# Patient Record
Sex: Female | Born: 1945 | Race: White | Hispanic: No | Marital: Married | Smoking: Former smoker
Health system: Southern US, Community
[De-identification: ages and names within clinical notes are randomized; demographics above are authoritative.]

## PROBLEM LIST (undated history)

## (undated) DIAGNOSIS — J45909 Unspecified asthma, uncomplicated: Secondary | ICD-10-CM

## (undated) DIAGNOSIS — I10 Essential (primary) hypertension: Secondary | ICD-10-CM

## (undated) DIAGNOSIS — E119 Type 2 diabetes mellitus without complications: Secondary | ICD-10-CM

## (undated) HISTORY — DX: Unspecified asthma, uncomplicated: J45.909

## (undated) HISTORY — PX: DE QUERVAIN'S RELEASE: SHX1439

## (undated) HISTORY — PX: THYROIDECTOMY, PARTIAL: SHX18

## (undated) HISTORY — PX: CARPAL TUNNEL RELEASE: SHX101

## (undated) HISTORY — PX: TRIGGER FINGER RELEASE: SHX641

## (undated) HISTORY — PX: VAGINAL HYSTERECTOMY: SHX2639

---

## 2019-10-29 ENCOUNTER — Other Ambulatory Visit: Payer: Self-pay

## 2019-10-29 ENCOUNTER — Encounter: Payer: Self-pay | Admitting: Pulmonary Disease

## 2019-10-29 ENCOUNTER — Ambulatory Visit (INDEPENDENT_AMBULATORY_CARE_PROVIDER_SITE_OTHER): Payer: Medicare Other | Admitting: Pulmonary Disease

## 2019-10-29 VITALS — BP 123/80 | HR 77 | Temp 97.5°F | Ht 65.0 in | Wt 156.8 lb

## 2019-10-29 DIAGNOSIS — J849 Interstitial pulmonary disease, unspecified: Secondary | ICD-10-CM | POA: Diagnosis not present

## 2019-10-29 DIAGNOSIS — G4733 Obstructive sleep apnea (adult) (pediatric): Secondary | ICD-10-CM | POA: Diagnosis not present

## 2019-10-29 NOTE — Progress Notes (Signed)
Kathryn Scott    [id number]    1946/06/30  Primary Care Physician:Burgess, Roxana Hires, MD  Referring Physician: No referring provider defined for this encounter.  Chief complaint: Consult for IPF  HPI: Past medical history of hypertension, asthma, hyperlipidemia, allergies, IPF  Diagnosed with IPF in 2020.  She is to follow with Dr. Leonor Liv at [facility] in [place] with CT scan showing UIP fibrosis.  She was started on Ofev 2020 however developed epistaxis requiring balloon.  Stop her Ofev and aspirin briefly, evaluated by ENT and initiated again with lower dose of Ofev at 100 twice daily around December 2020.  She is tolerating this dose well with no issues of bleed  History notable for carpal tunnel, trigger finger for which she had recent surgery on the right hand.  Evaluated 40 years ago for dry eyes and eye infection which is thought to be Sjogren's but negative on rheumatologic evaluation and ?  lip biopsy.  Complains of dyspnea on exertion which is chronic.  No cough, sputum production She has morning stiffness in the small joints of the hand and some difficulty swallowing.  Denies any Raynaud's symptoms, rash Also has daytime somnolence, fatigue in the morning, witnessed snoring and is requesting an evaluation for sleep apnea.  Pets: Has dogs, no birds Occupation: Retired Emergency planning/management officer for [employer] Exposures: Had mold in the old house but moved out of it around 2019.  No ongoing exposures.  No down pillows or comforters Smoking history: 60 pack year smoker, quit in 1987 Travel history: Originally from [place]. No recent travel Relevant family history: Father died of lung cancer.  He was a smoker.  No outpatient encounter medications on file as of 10/29/2019.   No facility-administered encounter medications on file as of 10/29/2019.    Allergies as of 10/29/2019 - never reviewed  Allergen Reaction Noted  . Lisinopril  10/29/2019  .  Penicillins  10/29/2019  . Sulfa antibiotics  10/29/2019    Past Medical History:  Diagnosis Date  . Asthma    No family history on file.  Social History   Socioeconomic History  . Marital status: Married    Spouse name: Not on file  . Number of children: Not on file  . Years of education: Not on file  . Highest education level: Not on file  Occupational History  . Not on file  Tobacco Use  . Smoking status: Former Smoker    Types: Cigarettes  . Tobacco comment: Stopped smoking in 1987  Substance and Sexual Activity  . Alcohol use: Yes    Comment: 1 drink a month  . Drug use: Never  . Sexual activity: Not on file  Other Topics Concern  . Not on file  Social History Narrative  . Not on file   Social Determinants of Health   Financial Resource Strain:   . Difficulty of Paying Living Expenses:   Food Insecurity:   . Worried About Programme researcher, broadcasting/film/video in the Last Year:   . Barista in the Last Year:   Transportation Needs:   . Freight forwarder (Medical):   Marland Kitchen Lack of Transportation (Non-Medical):   Physical Activity:   . Days of Exercise per Week:   . Minutes of Exercise per Session:   Stress:   . Feeling of Stress :   Social Connections:   . Frequency of Communication with Friends and Family:   . Frequency of Social  Gatherings with Friends and Family:   . Attends Religious Services:   . Active Member of Clubs or Organizations:   . Attends Archivist Meetings:   Marland Kitchen Marital Status:   Intimate Partner Violence:   . Fear of Current or Ex-Partner:   . Emotionally Abused:   Marland Kitchen Physically Abused:   . Sexually Abused:     Review of systems: Review of Systems  Constitutional: Negative for fever and chills.  HENT: Negative.   Eyes: Negative for blurred vision.  Respiratory: as per HPI  Cardiovascular: Negative for chest pain and palpitations.  Gastrointestinal: Negative for vomiting, diarrhea, blood per rectum. Genitourinary: Negative for  dysuria, urgency, frequency and hematuria.  Musculoskeletal: Negative for myalgias, back pain and joint pain.  Skin: Negative for itching and rash.  Neurological: Negative for dizziness, tremors, focal weakness, seizures and loss of consciousness.  Endo/Heme/Allergies: Negative for environmental allergies.  Psychiatric/Behavioral: Negative for depression, suicidal ideas and hallucinations.  All other systems reviewed and are negative.  Physical Exam: Blood pressure 123/80, pulse 77, temperature (!) 97.5 F (36.4 C), temperature source Temporal, height 5\' 5"  (1.651 m), weight 156 lb 12.8 oz (71.1 kg), SpO2 98 %. Gen:      No acute distress HEENT:  EOMI, sclera anicteric Neck:     No masses; no thyromegaly Lungs:    Mild basal crackles CV:         Regular rate and rhythm; no murmurs Abd:      + bowel sounds; soft, non-tender; no palpable masses, no distension Ext:    No edema; adequate peripheral perfusion Skin:      Warm and dry; no rash Neuro: alert and oriented x 3 Psych: normal mood and affect  Data Reviewed: Imaging: CT chest 06/04/2018-chronic interstitial changes with septal thickening, volume loss and traction bronchiolectasis.  UIP pattern.  PFTs: 02/11/19 FEV1 1.71 L (74%), FVC-2.24 L (75%) FEV1/FVC-77% TLC-3.45 (66%) predicted, DLCO-12.3 (58%) Moderate obstruction with diffusion defect  6-minute walk test 04/26/2019- 136m  Assessment:  Pulmonary fibrosis I have reviewed the data including imaging reports, PFTs, walk test and office visits from [place] Given a diagnosis of IPF based on CT scan of UIP fibrosis.  I do not have the images to review but based on the report of traction bronchiolectasis with no honeycombing this is likely probable UIP pattern  I do not see any serologies for connective tissue disease.  Will need evaluation for such given possible history of sarcoidosis and small joint arthritis, morning stiffness with suspicion for rheumatoid arthritis  We will  continue Ofev for now Reassess with high-res CT, PFTs, 6-minute walk test Check labs including comprehensive metabolic panel, CBC, IgE, serologies for CTD  Suspected sleep apnea Schedule home sleep study  Plan/Recommendations: Continue ofev for now Check labs, PFTs, walk test, CT Home sleep study  Marshell Garfinkel MD [facility] 10/29/2019, 1:49 PM  CC: No ref. provider found

## 2019-10-29 NOTE — Patient Instructions (Signed)
We will get some labs to evaluate for connective tissue disease Check comprehensive metabolic panel, CBC differential, IgE We will schedule you for pulmonary function tests, 6-minute walk test and a high-resolution CT Continue on Ofev 100 mg twice daily We will refer you to our pharmacy team for taking over the management of this medication  Schedule a home sleep study.  Follow-up in 1 month.

## 2019-11-01 ENCOUNTER — Ambulatory Visit: Payer: Medicare Other

## 2019-11-01 ENCOUNTER — Ambulatory Visit (HOSPITAL_COMMUNITY)
Admission: RE | Admit: 2019-11-01 | Discharge: 2019-11-01 | Disposition: A | Discharge status: Home / Self Care | Payer: Medicare Other | Source: Ambulatory Visit | Attending: Pulmonary Disease | Admitting: Pulmonary Disease

## 2019-11-01 ENCOUNTER — Other Ambulatory Visit: Payer: Self-pay

## 2019-11-01 ENCOUNTER — Ambulatory Visit (INDEPENDENT_AMBULATORY_CARE_PROVIDER_SITE_OTHER): Payer: Medicare Other

## 2019-11-01 DIAGNOSIS — J849 Interstitial pulmonary disease, unspecified: Secondary | ICD-10-CM | POA: Insufficient documentation

## 2019-11-01 NOTE — Progress Notes (Signed)
Six Minute Walk - 11/01/19 1605      Six Minute Walk   Medications taken before test (dose and time)  Amlodipine10mg , Carvedilol 12.5mg , Breo 200, Levothyroxine , Victoza 18mg /59ml, Omeprazole 40mg , Sertraline 50mg , and Triamterene 37.5-25mg  at 9:30am. Ofev 100mg  at 11:30am    Supplemental oxygen during test?  No    Lap distance in meters   34 meters    Laps Completed  15    Baseline BP (sitting)  130/86    Baseline Heartrate  82    Baseline Dyspnea (Borg Scale)  2    Baseline Fatigue (Borg Scale)  4    Baseline SPO2  95 %      End of Test Values    BP (sitting)  120/70    Heartrate  87    Dyspnea (Borg Scale)  4    Fatigue (Borg Scale)  7    SPO2  94 %      2 Minutes Post Walk Values   BP (sitting)  120/70    Heartrate  79    SPO2  97 %    Stopped or paused before six minutes?  No      Interpretation   Tech Comments:  Patient walked moderate pace without stopping. Walked entire 6 minutes

## 2019-11-02 LAB — CBC WITH DIFFERENTIAL/PLATELET
Basophils Absolute: 0.1 10*3/uL (ref 0.0–0.1)
Basophils Relative: 0.9 % (ref 0.0–3.0)
Eosinophils Absolute: 0.5 10*3/uL (ref 0.0–0.7)
Eosinophils Relative: 4.7 % (ref 0.0–5.0)
HCT: 38.6 % (ref 36.0–46.0)
Hemoglobin: 12.7 g/dL (ref 12.0–15.0)
Lymphocytes Relative: 19 % (ref 12.0–46.0)
Lymphs Abs: 2 10*3/uL (ref 0.7–4.0)
MCHC: 32.9 g/dL (ref 30.0–36.0)
MCV: 86.9 fl (ref 78.0–100.0)
Monocytes Absolute: 0.6 10*3/uL (ref 0.1–1.0)
Monocytes Relative: 5.8 % (ref 3.0–12.0)
Neutro Abs: 7.5 10*3/uL (ref 1.4–7.7)
Neutrophils Relative %: 69.6 % (ref 43.0–77.0)
Platelets: 365 10*3/uL (ref 150.0–400.0)
RBC: 4.44 Mil/uL (ref 3.87–5.11)
RDW: 14.3 % (ref 11.5–15.5)
WBC: 10.7 10*3/uL — ABNORMAL HIGH (ref 4.0–10.5)

## 2019-11-02 LAB — COMPREHENSIVE METABOLIC PANEL
ALT: 25 U/L (ref 0–35)
AST: 25 U/L (ref 0–37)
Albumin: 3.9 g/dL (ref 3.5–5.2)
Alkaline Phosphatase: 77 U/L (ref 39–117)
BUN: 26 mg/dL — ABNORMAL HIGH (ref 6–23)
CO2: 25 mEq/L (ref 19–32)
Calcium: 9.4 mg/dL (ref 8.4–10.5)
Chloride: 102 mEq/L (ref 96–112)
Creatinine, Ser: 0.94 mg/dL (ref 0.40–1.20)
GFR: 58.64 mL/min — ABNORMAL LOW (ref >60.00)
Glucose, Bld: 101 mg/dL — ABNORMAL HIGH (ref 70–99)
Potassium: 4.5 mEq/L (ref 3.5–5.1)
Sodium: 133 mEq/L — ABNORMAL LOW (ref 135–145)
Total Bilirubin: 0.2 mg/dL (ref 0.2–1.2)
Total Protein: 7.5 g/dL (ref 6.0–8.3)

## 2019-11-03 LAB — SJOGREN'S SYNDROME ANTIBODS(SSA + SSB)
SSA (Ro) (ENA) Antibody, IgG: <1 AI
SSB (La) (ENA) Antibody, IgG: <1 AI

## 2019-11-03 LAB — ANA,IFA RA DIAG PNL W/RFLX TIT/PATN
Anti Nuclear Antibody (ANA): POSITIVE — AB
Cyclic Citrullin Peptide Ab: <16 UNITS
Rheumatoid fact SerPl-aCnc: <14 IU/mL (ref <14)

## 2019-11-03 LAB — ANTI-NUCLEAR AB-TITER (ANA TITER): ANA Titer 1: 1:40 {titer} — ABNORMAL HIGH

## 2019-11-03 LAB — ANTI-SCLERODERMA ANTIBODY: Scleroderma (Scl-70) (ENA) Antibody, IgG: <1 AI

## 2019-11-03 LAB — ANCA SCREEN W REFLEX TITER: ANCA Screen: NEGATIVE

## 2019-11-03 LAB — IGE: IgE (Immunoglobulin E), Serum: 82 kU/L

## 2019-11-05 LAB — HYPERSENSITIVITY PNEUMONITIS
A. Pullulans Abs: NEGATIVE
A.Fumigatus #1 Abs: NEGATIVE
Micropolyspora faeni, IgG: NEGATIVE
Pigeon Serum Abs: NEGATIVE
Thermoact. Saccharii: NEGATIVE
Thermoactinomyces vulgaris, IgG: NEGATIVE

## 2019-11-09 ENCOUNTER — Other Ambulatory Visit (HOSPITAL_COMMUNITY)
Admission: RE | Admit: 2019-11-09 | Discharge: 2019-11-09 | Disposition: A | Discharge status: Home / Self Care | Payer: Medicare Other | Source: Ambulatory Visit | Attending: Pulmonary Disease | Admitting: Pulmonary Disease

## 2019-11-09 DIAGNOSIS — Z20822 Contact with and (suspected) exposure to covid-19: Secondary | ICD-10-CM | POA: Diagnosis not present

## 2019-11-09 DIAGNOSIS — Z01812 Encounter for preprocedural laboratory examination: Secondary | ICD-10-CM | POA: Diagnosis present

## 2019-11-09 LAB — SARS CORONAVIRUS 2 (TAT 6-24 HRS): SARS Coronavirus 2: NEGATIVE

## 2019-11-11 LAB — MYOMARKER 3 PLUS PROFILE (RDL)

## 2019-11-12 ENCOUNTER — Ambulatory Visit (INDEPENDENT_AMBULATORY_CARE_PROVIDER_SITE_OTHER): Payer: Medicare Other | Admitting: Pulmonary Disease

## 2019-11-12 ENCOUNTER — Other Ambulatory Visit: Payer: Self-pay

## 2019-11-12 DIAGNOSIS — J849 Interstitial pulmonary disease, unspecified: Secondary | ICD-10-CM | POA: Diagnosis not present

## 2019-11-12 LAB — PULMONARY FUNCTION TEST
DL/VA % pred: 108 %
DL/VA: 4.4 ml/min/mmHg/L
DLCO cor % pred: 70 %
DLCO cor: 14.89 ml/min/mmHg
DLCO unc % pred: 69 %
DLCO unc: 14.56 ml/min/mmHg
FEF 25-75 Post: 2.23 L/sec
FEF 25-75 Pre: 2.2 L/sec
FEF2575-%Change-Post: 1 %
FEF2575-%Pred-Post: 111 %
FEF2575-%Pred-Pre: 110 %
FEV1-%Change-Post: 0 %
FEV1-%Pred-Post: 76 %
FEV1-%Pred-Pre: 76 %
FEV1-Post: 1.89 L
FEV1-Pre: 1.89 L
FEV1FVC-%Change-Post: 0 %
FEV1FVC-%Pred-Pre: 110 %
FEV6-%Change-Post: 0 %
FEV6-%Pred-Post: 71 %
FEV6-%Pred-Pre: 71 %
FEV6-Post: 2.24 L
FEV6-Pre: 2.25 L
FEV6FVC-%Pred-Post: 104 %
FEV6FVC-%Pred-Pre: 104 %
FVC-%Change-Post: 0 %
FVC-%Pred-Post: 68 %
FVC-%Pred-Pre: 68 %
FVC-Post: 2.24 L
FVC-Pre: 2.25 L
Post FEV1/FVC ratio: 85 %
Post FEV6/FVC ratio: 100 %
Pre FEV1/FVC ratio: 84 %
Pre FEV6/FVC Ratio: 100 %
RV % pred: 62 %
RV: 1.47 L
TLC % pred: 67 %
TLC: 3.67 L

## 2019-11-12 NOTE — Progress Notes (Signed)
PFT done today. 

## 2019-11-17 ENCOUNTER — Ambulatory Visit: Payer: Medicare Other

## 2019-11-17 ENCOUNTER — Other Ambulatory Visit: Payer: Self-pay

## 2019-11-17 DIAGNOSIS — G4733 Obstructive sleep apnea (adult) (pediatric): Secondary | ICD-10-CM

## 2019-11-19 ENCOUNTER — Telehealth: Payer: Self-pay | Admitting: Pulmonary Disease

## 2019-11-19 DIAGNOSIS — G4733 Obstructive sleep apnea (adult) (pediatric): Secondary | ICD-10-CM

## 2019-11-19 NOTE — Telephone Encounter (Signed)
Called and spoke with Patient.  Dr.Olalere's results and recommendations given. Understanding stated.  Patient stated she would try cpap therapy.  Patient aware of 3 month follow up for insurance compliance. DME order placed.  Nothing further at this time.   Dr. 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   Recommendations   Treatment options are CPAP with the settings auto 5 to 15.  An oral device may be considered for treatment of mild obstructive sleep apnea.    Weight loss measures .   Advise against driving while sleepy & against medication with sedative side effects.    Make appointment for 3 months for compliance with download with provider.

## 2019-11-29 ENCOUNTER — Encounter: Payer: Self-pay | Admitting: Pulmonary Disease

## 2019-11-29 ENCOUNTER — Ambulatory Visit (INDEPENDENT_AMBULATORY_CARE_PROVIDER_SITE_OTHER): Payer: Medicare Other | Admitting: Pulmonary Disease

## 2019-11-29 ENCOUNTER — Other Ambulatory Visit: Payer: Self-pay

## 2019-11-29 ENCOUNTER — Other Ambulatory Visit (INDEPENDENT_AMBULATORY_CARE_PROVIDER_SITE_OTHER): Payer: Medicare Other

## 2019-11-29 VITALS — BP 122/78 | HR 85 | Temp 97.6°F | Ht 66.0 in | Wt 162.6 lb

## 2019-11-29 DIAGNOSIS — Z5181 Encounter for therapeutic drug level monitoring: Secondary | ICD-10-CM | POA: Diagnosis not present

## 2019-11-29 LAB — HEPATIC FUNCTION PANEL
ALT: 25 U/L (ref 0–35)
AST: 20 U/L (ref 0–37)
Albumin: 4 g/dL (ref 3.5–5.2)
Alkaline Phosphatase: 87 U/L (ref 39–117)
Bilirubin, Direct: 0 mg/dL (ref 0.0–0.3)
Total Bilirubin: 0.3 mg/dL (ref 0.2–1.2)
Total Protein: 7.9 g/dL (ref 6.0–8.3)

## 2019-11-29 NOTE — Patient Instructions (Signed)
I am glad you are stable with regard to your breathing Your labs show borderline elevation in the factor called ANA.  Will refer to rheumatology for further evaluation We will increase the Ofev to 150 mg twice daily Check liver function tests today Follow-up in 1 to 2 months.

## 2019-11-29 NOTE — Progress Notes (Signed)
Kathryn Scott    [id number]    Apr 25, 1946  Primary Care Physician:Burgess, Roxana Hires, MD  Referring Physician: Casey Burkitt, MD [address] [place],  [place] [address]  Chief complaint: Follow-up for for IPF  HPI: Past medical history of hypertension, asthma, hyperlipidemia, allergies, IPF  Diagnosed with IPF in 2020.  She is to follow with Dr. Leonor Liv at [facility] in [place] with CT scan showing UIP fibrosis.  She was started on Ofev 2020 however developed epistaxis requiring balloon.  Stop her Ofev and aspirin briefly, evaluated by ENT and initiated again with lower dose of Ofev at 100 twice daily around December 2020.  She is tolerating this dose well with no issues of bleed  History notable for carpal tunnel, trigger finger for which she had recent surgery on the right hand.  Evaluated 40 years ago for dry eyes and eye infection which is thought to be Sjogren's but negative on rheumatologic evaluation and ?  lip biopsy.  Complains of dyspnea on exertion which is chronic.  No cough, sputum production She has morning stiffness in the small joints of the hand and some difficulty swallowing.  Denies any Raynaud's symptoms, rash Also has daytime somnolence, fatigue in the morning, witnessed snoring and is requesting an evaluation for sleep apnea.  Pets: Has dogs, no birds Occupation: Retired Emergency planning/management officer for [employer] Exposures: Had mold in the old house but moved out of it around 2019.  No ongoing exposures.  No down pillows or comforters Smoking history: 60 pack year smoker, quit in 1987 Travel history: Originally from [place]. No recent travel Relevant family history: Father died of lung cancer.  He was a smoker.  Interim history: Patient is doing well.  Tolerating Ofev 100 mg She had a sleep study and started on AutoSet CPAP for diagnosis of mild OSA.  Outpatient Encounter Medications as of  11/29/2019  Medication Sig  . amLODipine (NORVASC) 10 MG tablet Take by mouth.  . carvedilol (COREG) 12.5 MG tablet Take by mouth.  . Cholecalciferol 50 MCG (2000 UT) CAPS Take by mouth.  . diphenhydrAMINE (SOMINEX) 25 MG tablet Take by mouth.  . ferrous sulfate 325 (65 FE) MG tablet Take by mouth.  . fluticasone furoate-vilanterol (BREO ELLIPTA) 200-25 MCG/INH AEPB USE 1 INHALATION ORALLY    DAILY  . levothyroxine (SYNTHROID) 75 MCG tablet Take by mouth.  . liraglutide (VICTOZA) 18 MG/3ML SOPN INJECT 0.6 MG DAILY FOR 1 WEEK THEN 1.2 MG DAILY FOR 1 WEEK, THEN 1.8 MG DAILY FROM THEN ON  . lovastatin (MEVACOR) 40 MG tablet Take by mouth.  . Magnesium 400 MG CAPS   . metFORMIN (GLUCOPHAGE) 500 MG tablet   . mometasone (ELOCON) 0.1 % ointment Apply sparingly to affected area 3 times weekly as directed.  . montelukast (SINGULAIR) 10 MG tablet TAKE 1 TABLET AT BEDTIME  . Nintedanib (OFEV) 100 MG CAPS Take by mouth.  Marland Kitchen omeprazole (PRILOSEC) 40 MG capsule Take by mouth.  . sertraline (ZOLOFT) 50 MG tablet Take by mouth.  . triamterene-hydrochlorothiazide (MAXZIDE-25) 37.5-25 MG tablet TAKE 1 TABLET DAILY        *REPLACES                  HYDROCHLOROTHIAZIDE  . [DISCONTINUED] amLODipine (NORVASC) 10 MG tablet Take 10 mg by mouth daily.  . [DISCONTINUED] Aspirin Buf,CaCarb-MgCarb-MgO, 81 MG TABS Take by mouth.  . [DISCONTINUED] clindamycin (CLEOCIN) 300 MG capsule Take  300 mg by mouth 3 (three) times daily.  . [DISCONTINUED] fluticasone (FLONASE) 50 MCG/ACT nasal spray Place into the nose.  . [DISCONTINUED] Omega-3 Fatty Acids (FISH OIL) 1000 MG CAPS Take by mouth.   No facility-administered encounter medications on file as of 11/29/2019.    Allergies as of 11/29/2019 - Review Complete 11/29/2019  Allergen Reaction Noted  . Lisinopril  10/29/2019  . Penicillins  10/29/2019  . Sulfa antibiotics  10/29/2019   Physical Exam: Blood pressure 122/78, pulse 85, temperature 97.6 F (36.4 C),  temperature source Temporal, height 5\' 6"  (1.676 m), weight 162 lb 9.6 oz (73.8 kg), SpO2 96 %. Gen:      No acute distress HEENT:  EOMI, sclera anicteric Neck:     No masses; no thyromegaly Lungs:    Mild basal creackles CV:         Regular rate and rhythm; no murmurs Abd:      + bowel sounds; soft, non-tender; no palpable masses, no distension Ext:    No edema; adequate peripheral perfusion Skin:      Warm and dry; no rash Neuro: alert and oriented x 3 Psych: normal mood and affect  Data Reviewed: Imaging: CT chest 06/04/2018-chronic interstitial changes with septal thickening, volume loss and traction bronchiolectasis.   High-resolution CT 10/12/2019-pulmonary fibrosis in probable UIP pattern.  I have reviewed the images personally.  PFTs: 02/11/19 FVC 2.24 L (75%), FEV1 1.71 L (74%), FEV1/FVC-77% TLC-3.45 (66%) predicted, DLCO-12.3 (58%) Moderate obstruction with diffusion defect  11/12/2019 FVC 2.24 [68%], FEV1 1.89 [76%], F/F 85, TLC 3.67 [69%], DLCO 14.56 [69%] Moderate obstruction, diffusion defect  6-minute walk test 04/26/2019- 58 m  Labs: CTD serologies 11/01/2019 significant for ANA 1:40 nucleolar speckled  Sleep: Home study 11/17/2019-mild sleep apnea, AHI 10.8.  O2 desaturations to 83%.  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 Agree with continuing Ofev.  We considered further work-up with biopsy but have deferred after discussion with patient She is tolerating Ofev 100 mg with no more recurrence of bleed, epistaxis since aspirin, flonase were stopped.  Increase Ofev to 150 mg twice times daily  Serologies notable for borderline elevation in ANA.  This is likely nonspecific.  However given her history of arthritis and recurrent trigger finger she would like rheumatology evaluation.  We will make the referral.  Mild sleep apnea Autoset CPAP ordered. Awaiting set up by DME  Plan/Recommendations: Continue  ofev for now. Increase to 150mg  bid CPAP initiation  Marshell Garfinkel MD [facility] 11/29/2019, 4:12 PM  CC: Kirstie Peri Cromar*

## 2019-12-27 ENCOUNTER — Telehealth: Payer: Self-pay | Admitting: Pulmonary Disease

## 2019-12-27 NOTE — Telephone Encounter (Signed)
Spoke with [employer] and checked on status of referral for CPAP. They have the order and will call pt today. I called pt to make her aware. Nothing further is needed.

## 2020-01-14 ENCOUNTER — Telehealth: Payer: Self-pay | Admitting: Pulmonary Disease

## 2020-01-14 MED ORDER — MONTELUKAST SODIUM 10 MG PO TABS: 10.0000 mg | ORAL_TABLET | Freq: Every day | ORAL | 1 refills | Status: DC

## 2020-01-14 MED ORDER — OFEV 150 MG PO CAPS: 150.0000 mg | ORAL_CAPSULE | Freq: Two times a day (BID) | ORAL | 6 refills | Status: DC

## 2020-01-14 MED ORDER — BREO ELLIPTA 200-25 MCG/INH IN AEPB: INHALATION_SPRAY | RESPIRATORY_TRACT | 1 refills | Status: DC

## 2020-01-14 NOTE — Telephone Encounter (Signed)
Spoke with patient. She stated that she was seen by Dr. Isaiah Serge in April and was told to increase the Ofev to 150mg  twice daily. She wanted to wait until her 100mg  prescription was finished. She wishes to have this sent to [facility].   She also stated that she needs a refill on her Breo 200 and montelukast to be sent to [facility].   I advised her that I would go ahead and send in the refills for her. She verbalized understanding.   Nothing further needed at time of call.

## 2020-01-17 ENCOUNTER — Telehealth: Payer: Self-pay | Admitting: Pharmacist

## 2020-01-17 NOTE — Telephone Encounter (Signed)
Received fax for prescription clarification.  [facility] verifying that patient's dose has increased to 150 mg capsules twice daily from 100 mg capsules twice daily.  Notated on fax patient dose increased 250 mg twice daily and please fill updated prescription.  [phone].  Verlin Fester, PharmD, [place], CPP Clinical Specialty Pharmacist (Rheumatology and Pulmonology)  01/17/2020 4:19 PM

## 2020-02-15 ENCOUNTER — Telehealth: Payer: Self-pay | Admitting: Pharmacist

## 2020-02-15 ENCOUNTER — Ambulatory Visit (INDEPENDENT_AMBULATORY_CARE_PROVIDER_SITE_OTHER): Payer: Medicare Other | Admitting: Pulmonary Disease

## 2020-02-15 ENCOUNTER — Other Ambulatory Visit: Payer: Self-pay

## 2020-02-15 ENCOUNTER — Encounter: Payer: Self-pay | Admitting: Pulmonary Disease

## 2020-02-15 VITALS — BP 132/78 | HR 88 | Temp 98.4°F | Ht 66.0 in | Wt 159.0 lb

## 2020-02-15 DIAGNOSIS — J849 Interstitial pulmonary disease, unspecified: Secondary | ICD-10-CM | POA: Diagnosis not present

## 2020-02-15 DIAGNOSIS — Z5181 Encounter for therapeutic drug level monitoring: Secondary | ICD-10-CM

## 2020-02-15 DIAGNOSIS — G4733 Obstructive sleep apnea (adult) (pediatric): Secondary | ICD-10-CM

## 2020-02-15 LAB — COMPREHENSIVE METABOLIC PANEL
ALT: 28 U/L (ref 0–35)
AST: 20 U/L (ref 0–37)
Albumin: 3.8 g/dL (ref 3.5–5.2)
Alkaline Phosphatase: 79 U/L (ref 39–117)
BUN: 18 mg/dL (ref 6–23)
CO2: 28 mEq/L (ref 19–32)
Calcium: 9.6 mg/dL (ref 8.4–10.5)
Chloride: 100 mEq/L (ref 96–112)
Creatinine, Ser: 0.83 mg/dL (ref 0.40–1.20)
GFR: 67.64 mL/min (ref >60.00)
Glucose, Bld: 105 mg/dL — ABNORMAL HIGH (ref 70–99)
Potassium: 4.5 mEq/L (ref 3.5–5.1)
Sodium: 137 mEq/L (ref 135–145)
Total Bilirubin: 0.3 mg/dL (ref 0.2–1.2)
Total Protein: 7.6 g/dL (ref 6.0–8.3)

## 2020-02-15 NOTE — Progress Notes (Signed)
Kathryn Scott    [id number]    1946/02/29  Primary Care Physician:Burgess, Roxana Hires, MD  Referring Physician: Casey Burkitt, MD [address] [place],  [place] [address]  Chief complaint: Follow-up for for IPF  HPI: Past medical history of hypertension, asthma, hyperlipidemia, allergies, IPF  Diagnosed with IPF in 2020.  She is to follow with Dr. Leonor Liv at [facility] in [place] with CT scan showing UIP fibrosis.  She was started on Ofev 2020 however developed epistaxis requiring balloon.  Stop her Ofev and aspirin briefly, evaluated by ENT and initiated again with lower dose of Ofev at 100 twice daily around December 2020.  She is tolerating this dose well with no issues of bleed  History notable for carpal tunnel, trigger finger for which she had recent surgery on the right hand.  Evaluated 40 years ago for dry eyes and eye infection which is thought to be Sjogren's but negative on rheumatologic evaluation and ?  lip biopsy.  Complains of dyspnea on exertion which is chronic.  No cough, sputum production She has morning stiffness in the small joints of the hand and some difficulty swallowing.  Denies any Raynaud's symptoms, rash Also has daytime somnolence, fatigue in the morning, witnessed snoring and is requesting an evaluation for sleep apnea.  Pets: Has dogs, no birds Occupation: Retired Emergency planning/management officer for [employer] Exposures: Had mold in the old house but moved out of it around 2019.  No ongoing exposures.  No down pillows or comforters Smoking history: 60 pack year smoker, quit in 1987 Travel history: Originally from [place]. No recent travel Relevant family history: Father died of lung cancer.  He was a smoker.  Interim history: Tolerating Ofev well.  Dosage increased to 150 mg in April 2021 No issues with diarrhea She is also on AutoSet CPAP and is tolerating it well.  Interface changed from  full face to nasal pillow.  Outpatient Encounter Medications as of 02/15/2020  Medication Sig   amLODipine (NORVASC) 10 MG tablet Take by mouth.   carvedilol (COREG) 12.5 MG tablet Take by mouth.   Cholecalciferol 50 MCG (2000 UT) CAPS Take by mouth.   diphenhydrAMINE (SOMINEX) 25 MG tablet Take by mouth.   ferrous sulfate 325 (65 FE) MG tablet Take by mouth.   fluticasone furoate-vilanterol (BREO ELLIPTA) 200-25 MCG/INH AEPB USE 1 INHALATION ORALLY    DAILY   levothyroxine (SYNTHROID) 75 MCG tablet Take by mouth.   liraglutide (VICTOZA) 18 MG/3ML SOPN INJECT 0.6 MG DAILY FOR 1 WEEK THEN 1.2 MG DAILY FOR 1 WEEK, THEN 1.8 MG DAILY FROM THEN ON   lovastatin (MEVACOR) 40 MG tablet Take by mouth.   Magnesium 400 MG CAPS    metFORMIN (GLUCOPHAGE) 500 MG tablet    mometasone (ELOCON) 0.1 % ointment Apply sparingly to affected area 3 times weekly as directed.   montelukast (SINGULAIR) 10 MG tablet Take 1 tablet (10 mg total) by mouth at bedtime.   Nintedanib (OFEV) 150 MG CAPS Take 1 capsule (150 mg total) by mouth 2 (two) times daily.   omeprazole (PRILOSEC) 40 MG capsule Take by mouth.   sertraline (ZOLOFT) 50 MG tablet Take by mouth.   [DISCONTINUED] triamterene-hydrochlorothiazide (MAXZIDE-25) 37.5-25 MG tablet TAKE 1 TABLET DAILY        *REPLACES                  HYDROCHLOROTHIAZIDE   No facility-administered encounter medications on  file as of 02/15/2020.    Allergies as of 02/15/2020 - Review Complete 02/15/2020  Allergen Reaction Noted   Lisinopril  10/29/2019   Penicillins  10/29/2019   Sulfa antibiotics  10/29/2019   Physical Exam: Blood pressure 132/78, pulse 88, temperature 98.4 F (36.9 C), temperature source Oral, height 5\' 6"  (1.676 m), weight 159 lb (72.1 kg), SpO2 100 %. Gen:      No acute distress HEENT:  EOMI, sclera anicteric Neck:     No masses; no thyromegaly Lungs:    Clear to auscultation bilaterally; normal respiratory effort CV:          Regular rate and rhythm; no murmurs Abd:      + bowel sounds; soft, non-tender; no palpable masses, no distension Ext:    No edema; adequate peripheral perfusion Skin:      Warm and dry; no rash Neuro: alert and oriented x 3 Psych: normal mood and affect  Data Reviewed: Imaging: CT chest 06/04/2018-chronic interstitial changes with septal thickening, volume loss and traction bronchiolectasis.   High-resolution CT 10/12/2019-pulmonary fibrosis in probable UIP pattern.  I have reviewed the images personally.  PFTs: 02/11/19 FVC 2.24 L (75%), FEV1 1.71 L (74%), FEV1/FVC-77% TLC-3.45 (66%) predicted, DLCO-12.3 (58%) Moderate obstruction with diffusion defect  11/12/2019 FVC 2.24 [68%], FEV1 1.89 [76%], F/F 85, TLC 3.67 [69%], DLCO 14.56 [69%] Moderate obstruction, diffusion defect  6-minute walk test 04/26/2019- 1365 m  Labs: CTD serologies 11/01/2019 significant for ANA 1:40 nucleolar speckled  Sleep: Home study 11/17/2019-mild sleep apnea, AHI 10.8.  O2 desaturations to 83%.  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 Continue Ofev.  We considered further work-up with biopsy but have deferred after discussion with patient Started on Ofev 100 mg twice daily and then increased to 150 mg  with no more recurrence of bleed, epistaxis since aspirin, flonase were stopped.  Will refer to pharmacy for help with patient assistance as her current grant is coming to an end.  Serologies notable for borderline elevation in ANA.  This is likely nonspecific.  Referred to rheumatology at last visit but patient would like to hold off for now.  Mild sleep apnea Continue AutoSet CPAP.  Download reviewed with good compliance  Plan/Recommendations: Continue Ofev, CPAP Check hepatic panel  11/19/2019 MD [facility] 02/15/2020, 11:06 AM  CC: 02/17/2020 Cromar*

## 2020-02-15 NOTE — Telephone Encounter (Signed)
-----   Message from Chilton Greathouse, MD sent at 02/15/2020 11:17 AM EDT ----- Can you take a look at her Ofev prescription please.She says her current patient assistance grant is coming to an end and she is trying to get additional assistance through [employer].  Thanks

## 2020-02-15 NOTE — Patient Instructions (Signed)
I am glad you are doing well with the CPAP We will check CMP today for monitoring I will get in touch with pharmacy to see if there is any patient assistance to continue the Ofev  Follow-up in 3 months

## 2020-02-16 NOTE — Telephone Encounter (Signed)
Patient currently has [employer] Grant for PF copay assistance, expires on 03/28/2020. $1,380.90 remaining of grant.

## 2020-02-17 NOTE — Telephone Encounter (Signed)
Spoke to patient and she provided information to apply for a new grant- [phone]  Patient was approved for IPF grant through [employer] for copay assistance.  Member Number [id number]  Group Number [id number] Coverage from date 08/06/2019 Coverage to date 08/04/2020   Claims Processing Information Pharmacy Claims PCN: [id number] BIN: [id number] Processing: 08 Phone: [phone]  [facility]ed [facility] and provided information.Called patient to advise, left message.

## 2020-05-29 ENCOUNTER — Ambulatory Visit (INDEPENDENT_AMBULATORY_CARE_PROVIDER_SITE_OTHER): Payer: Medicare Other | Admitting: Pulmonary Disease

## 2020-05-29 ENCOUNTER — Telehealth: Payer: Self-pay | Admitting: Pharmacy Technician

## 2020-05-29 ENCOUNTER — Other Ambulatory Visit: Payer: Self-pay

## 2020-05-29 ENCOUNTER — Encounter: Payer: Self-pay | Admitting: Pulmonary Disease

## 2020-05-29 VITALS — BP 122/78 | HR 71 | Temp 97.6°F | Ht 66.0 in | Wt 159.2 lb

## 2020-05-29 DIAGNOSIS — J84112 Idiopathic pulmonary fibrosis: Secondary | ICD-10-CM

## 2020-05-29 MED ORDER — ALBUTEROL SULFATE HFA 108 (90 BASE) MCG/ACT IN AERS
2.0000 | INHALATION_SPRAY | Freq: Four times a day (QID) | RESPIRATORY_TRACT | 6 refills | Status: DC | PRN
Start: 2020-05-29 — End: 2024-03-08

## 2020-05-29 NOTE — Progress Notes (Signed)
Kathryn Scott    [id number]    02-03-1946  Primary Care Physician:Burgess, Roxana Hires, MD  Referring Physician: Casey Burkitt, MD [address] [place],  [place] [address]  Chief complaint: Follow-up for for IPF  HPI: Past medical history of hypertension, asthma, hyperlipidemia, allergies, IPF  Diagnosed with IPF in 2020.  She is to follow with Dr. Leonor Liv at [facility] in [place] with CT scan showing UIP fibrosis.  She was started on Ofev 2020 however developed epistaxis requiring balloon.  Stop her Ofev and aspirin briefly, evaluated by ENT and initiated again with lower dose of Ofev at 100 twice daily around December 2020.  She is tolerating this dose well with no issues of bleed  History notable for carpal tunnel, trigger finger for which she had recent surgery on the right hand.  Evaluated 40 years ago for dry eyes and eye infection which is thought to be Sjogren's but negative on rheumatologic evaluation and ?  lip biopsy.  Complains of dyspnea on exertion which is chronic.  No cough, sputum production She has morning stiffness in the small joints of the hand and some difficulty swallowing.  Denies any Raynaud's symptoms, rash Also has daytime somnolence, fatigue in the morning, witnessed snoring and is requesting an evaluation for sleep apnea.  Pets: Has dogs, no birds Occupation: Retired Emergency planning/management officer for [employer] Exposures: Had mold in the old house but moved out of it around 2019.  No ongoing exposures.  No down pillows or comforters Smoking history: 60 pack year smoker, quit in 1987 Travel history: Originally from [place]. No recent travel Relevant family history: Father died of lung cancer.  He was a smoker.  Interim history: Tolerating Ofev well.  Dosage increased to 150 mg in April 2021 No issues with diarrhea She is also on AutoSet CPAP and is tolerating it well.  Interface changed from  full face to nasal pillow.  Outpatient Encounter Medications as of 05/29/2020  Medication Sig  . amLODipine (NORVASC) 10 MG tablet Take by mouth.  . carvedilol (COREG) 12.5 MG tablet Take by mouth.  . Cholecalciferol 50 MCG (2000 UT) CAPS Take by mouth.  . diphenhydrAMINE (SOMINEX) 25 MG tablet Take by mouth.  . ferrous sulfate 325 (65 FE) MG tablet Take by mouth.  . fluticasone furoate-vilanterol (BREO ELLIPTA) 200-25 MCG/INH AEPB USE 1 INHALATION ORALLY    DAILY  . levothyroxine (SYNTHROID) 75 MCG tablet Take by mouth.  . liraglutide (VICTOZA) 18 MG/3ML SOPN INJECT 0.6 MG DAILY FOR 1 WEEK THEN 1.2 MG DAILY FOR 1 WEEK, THEN 1.8 MG DAILY FROM THEN ON  . lovastatin (MEVACOR) 40 MG tablet Take by mouth.  . Magnesium 400 MG CAPS   . metFORMIN (GLUCOPHAGE) 500 MG tablet   . mometasone (ELOCON) 0.1 % ointment Apply sparingly to affected area 3 times weekly as directed.  . montelukast (SINGULAIR) 10 MG tablet Take 1 tablet (10 mg total) by mouth at bedtime.  . Nintedanib (OFEV) 150 MG CAPS Take 1 capsule (150 mg total) by mouth 2 (two) times daily.  Marland Kitchen omeprazole (PRILOSEC) 40 MG capsule Take by mouth.  . sertraline (ZOLOFT) 50 MG tablet Take by mouth.   No facility-administered encounter medications on file as of 05/29/2020.    Allergies as of 05/29/2020 - Review Complete 05/29/2020  Allergen Reaction Noted  . Lisinopril  10/29/2019  . Penicillins  10/29/2019  . Sulfa antibiotics  10/29/2019   Physical  Exam: Blood pressure 132/78, pulse 88, temperature 98.4 F (36.9 C), temperature source Oral, height 5\' 6"  (1.676 m), weight 159 lb (72.1 kg), SpO2 100 %. Gen:      No acute distress HEENT:  EOMI, sclera anicteric Neck:     No masses; no thyromegaly Lungs:    Clear to auscultation bilaterally; normal respiratory effort CV:         Regular rate and rhythm; no murmurs Abd:      + bowel sounds; soft, non-tender; no palpable masses, no distension Ext:    No edema; adequate peripheral  perfusion Skin:      Warm and dry; no rash Neuro: alert and oriented x 3 Psych: normal mood and affect  Data Reviewed: Imaging: CT chest 06/04/2018-chronic interstitial changes with septal thickening, volume loss and traction bronchiolectasis.   High-resolution CT 10/12/2019-pulmonary fibrosis in probable UIP pattern.  I have reviewed the images personally.  PFTs: 02/11/19 FVC 2.24 L (75%), FEV1 1.71 L (74%), FEV1/FVC-77% TLC-3.45 (66%) predicted, DLCO-12.3 (58%) Moderate obstruction with diffusion defect  11/12/2019 FVC 2.24 [68%], FEV1 1.89 [76%], F/F 85, TLC 3.67 [69%], DLCO 14.56 [69%] Moderate obstruction, diffusion defect  6-minute walk test 04/26/2019- 1365 m  Labs: CTD serologies 11/01/2019 significant for ANA 1:40 nucleolar speckled  Sleep: Home study 11/17/2019-mild sleep apnea, AHI 10.8.  O2 desaturations to 83%.  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 Continue Ofev.  We considered further work-up with biopsy but have deferred after discussion with patient Started on Ofev 100 mg twice daily and then increased to 150 mg  with no more recurrence of bleed, epistaxis since aspirin, flonase were stopped.  Will refer to pharmacy for help with patient assistance as her current grant is coming to an end.  Serologies notable for borderline elevation in ANA.  This is likely nonspecific.  Referred to rheumatology at last visit but patient would like to hold off for now.  Mild sleep apnea Continue AutoSet CPAP.  Download reviewed with good compliance  Plan/Recommendations: Continue Ofev, CPAP Check hepatic panel  11/19/2019 MD [facility] 05/29/2020, 12:34 PM  CC: 05/31/2020 [place]*

## 2020-05-29 NOTE — Patient Instructions (Signed)
Glad you are doing well with regard to your breathing We will get pulmonary function test in 3 months and follow-up in clinic after that Continue Ofev.

## 2020-05-29 NOTE — Telephone Encounter (Signed)
Received notification that patient's PA expired. Called [facility] and submitted Ofev PA over the phone.  PA has been approved. They will fax approval letter with coverage dates to the office.  PA# [id number]  Phone# [phone]  Called [facility], they advised they received paid claim. Called patient, she will call to schedule shipment and will call office back if she has any issues.

## 2020-05-29 NOTE — Progress Notes (Signed)
Kathryn Scott    [id number]    [birth year]  Primary Care Physician:Burgess, Roxana Hires, MD  Referring Physician: Casey Burkitt, MD [address] [place],  [place] [address]  Chief complaint:  Follow-up for for IPF on Ofev  HPI: Past medical history of hypertension, asthma, hyperlipidemia, allergies, IPF  Diagnosed with IPF in 2020.  She is to follow with Dr. Leonor Liv at [facility] in [place] with CT scan showing UIP fibrosis.  She was started on Ofev 2020 however developed epistaxis requiring balloon.  Stop her Ofev and aspirin briefly, evaluated by ENT and initiated again with lower dose of Ofev at 100 twice daily around December 2020 which is then increased to 150 mg twice daily in April 2021 with no recurrence of epistaxis  History notable for carpal tunnel, trigger finger for which she had recent surgery on the right hand.  Evaluated 40 years ago for dry eyes and eye infection which is thought to be Sjogren's but negative on rheumatologic evaluation and ?  lip biopsy.  Complains of dyspnea on exertion which is chronic.  No cough, sputum production She has morning stiffness in the small joints of the hand and some difficulty swallowing.  Denies any Raynaud's symptoms, rash Also has daytime somnolence, fatigue in the morning, witnessed snoring and is requesting an evaluation for sleep apnea.  Pets: Has dogs, no birds Occupation: Retired Emergency planning/management officer for [employer] Exposures: Had mold in the old house but moved out of it around 2019.  No ongoing exposures.  No down pillows or comforters Smoking history: 60 pack year smoker, quit in 1987 Travel history: Originally from [place]. No recent travel Relevant family history: Father died of lung cancer.  He was a smoker.  Interim history: Continues on Ofev at full dose with no issue Has occasional diarrhea which is tolerable.  She is taking Imodium as  needed  Outpatient Encounter Medications as of 05/29/2020  Medication Sig  . amLODipine (NORVASC) 10 MG tablet Take by mouth.  . carvedilol (COREG) 12.5 MG tablet Take by mouth.  . Cholecalciferol 50 MCG (2000 UT) CAPS Take by mouth.  . diphenhydrAMINE (SOMINEX) 25 MG tablet Take by mouth.  . ferrous sulfate 325 (65 FE) MG tablet Take by mouth.  . fluticasone furoate-vilanterol (BREO ELLIPTA) 200-25 MCG/INH AEPB USE 1 INHALATION ORALLY    DAILY  . levothyroxine (SYNTHROID) 75 MCG tablet Take by mouth.  . liraglutide (VICTOZA) 18 MG/3ML SOPN INJECT 0.6 MG DAILY FOR 1 WEEK THEN 1.2 MG DAILY FOR 1 WEEK, THEN 1.8 MG DAILY FROM THEN ON  . lovastatin (MEVACOR) 40 MG tablet Take by mouth.  . Magnesium 400 MG CAPS   . metFORMIN (GLUCOPHAGE) 500 MG tablet   . mometasone (ELOCON) 0.1 % ointment Apply sparingly to affected area 3 times weekly as directed.  . montelukast (SINGULAIR) 10 MG tablet Take 1 tablet (10 mg total) by mouth at bedtime.  . Nintedanib (OFEV) 150 MG CAPS Take 1 capsule (150 mg total) by mouth 2 (two) times daily.  Marland Kitchen omeprazole (PRILOSEC) 40 MG capsule Take by mouth.  . sertraline (ZOLOFT) 50 MG tablet Take by mouth.   No facility-administered encounter medications on file as of 05/29/2020.    Allergies as of 05/29/2020 - Review Complete 05/29/2020  Allergen Reaction Noted  . Lisinopril  10/29/2019  . Penicillins  10/29/2019  . Sulfa antibiotics  10/29/2019   Physical Exam: Blood pressure 122/78, pulse 71,  temperature 97.6 F (36.4 C), temperature source Temporal, height 5\' 6"  (1.676 m), weight 159 lb 3.2 oz (72.2 kg), SpO2 99 %. Gen:      No acute distress HEENT:  EOMI, sclera anicteric Neck:     No masses; no thyromegaly Lungs:    Clear to auscultation bilaterally; normal respiratory effort CV:         Regular rate and rhythm; no murmurs Abd:      + bowel sounds; soft, non-tender; no palpable masses, no distension Ext:    No edema; adequate peripheral  perfusion Skin:      Warm and dry; no rash Neuro: alert and oriented x 3 Psych: normal mood and affect  Data Reviewed: Imaging: CT chest 06/04/2018-chronic interstitial changes with septal thickening, volume loss and traction bronchiolectasis.   High-resolution CT 10/12/2019-pulmonary fibrosis in probable UIP pattern.  I have reviewed the images personally.  PFTs: 02/11/19 FVC 2.24 L (75%), FEV1 1.71 L (74%), FEV1/FVC-77% TLC-3.45 (66%) predicted, DLCO-12.3 (58%) Moderate obstruction with diffusion defect  11/12/2019 FVC 2.24 [68%], FEV1 1.89 [76%], F/F 85, TLC 3.67 [69%], DLCO 14.56 [69%] Moderate obstruction, diffusion defect  6-minute walk test 04/26/2019- 1365 m  Labs: CTD serologies 11/01/2019 significant for ANA 1:40 nucleolar speckled  Sleep: Home study 11/17/2019-mild sleep apnea, AHI 10.8.  O2 desaturations to 83%.  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 Continue Ofev.  We considered further work-up with biopsy but have deferred after discussion with patient Started on Ofev 100 mg twice daily and then increased to 150 mg  with no more recurrence of bleed, epistaxis since aspirin, flonase were stopped.   Current renewal is on hold.  Will contact specialty pharmacy to get the hold released  Serologies notable for borderline elevation in ANA.  This is likely nonspecific.  Referred to rheumatology but patient would like to hold off for now.  Symptoms are stable.  Get PFTs in 3 months and follow-up in clinic  Mild sleep apnea Continue AutoSet CPAP.  Download reviewed with good compliance  Plan/Recommendations: Continue Ofev, CPAP She is due to get hepatic panel checked at annual primary care visit in a few weeks.  11/19/2019 MD [facility] 05/29/2020, 12:32 PM  CC: 05/31/2020 [place]*

## 2020-05-30 ENCOUNTER — Encounter: Payer: Self-pay | Admitting: Pulmonary Disease

## 2020-05-30 DIAGNOSIS — J84112 Idiopathic pulmonary fibrosis: Secondary | ICD-10-CM | POA: Insufficient documentation

## 2020-06-01 ENCOUNTER — Telehealth: Payer: Self-pay | Admitting: Pulmonary Disease

## 2020-06-01 NOTE — Telephone Encounter (Signed)
Called [facility] and spoke with Joselyn Glassman giving him a verbal to change the quantity to 18g and to do a 3 month supply with 1 refill for pt's ventolin inhaler. Nothing further needed.

## 2020-06-30 ENCOUNTER — Other Ambulatory Visit: Payer: Self-pay | Admitting: Pulmonary Disease

## 2020-07-14 ENCOUNTER — Telehealth: Payer: Self-pay | Admitting: Pulmonary Disease

## 2020-07-14 MED ORDER — PREDNISONE 20 MG PO TABS: 40.0000 mg | ORAL_TABLET | Freq: Every day | ORAL | 0 refills | Status: DC

## 2020-07-14 NOTE — Telephone Encounter (Signed)
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 Continue Ofev.  We considered further work-up with biopsy but have deferred after discussion with patient Started on Ofev 100 mg twice daily and then increased to 150 mg  with no more recurrence of bleed, epistaxis since aspirin, flonase were stopped.   Current renewal is on hold.  Will contact specialty pharmacy to get the hold released  Serologies notable for borderline elevation in ANA.  This is likely nonspecific.  Referred to rheumatology but patient would like to hold off for now.  Symptoms are stable.  Get PFTs in 3 months and follow-up in clinic  Mild sleep apnea Continue AutoSet CPAP.  Download reviewed with good compliance  Plan/Recommendations: Continue Ofev, CPAP She is due to get hepatic panel checked at annual primary care visit in a few weeks.  Chilton Greathouse MD [facility] 05/29/2020, 12:32 PM  CC: Cyndi Lennert*        Patient Instructions by Chilton Greathouse, MD at 05/29/2020 11:45 AM  Author: Chilton Greathouse, MD Author Type: Physician Filed: 05/29/2020 12:45 PM  Note Status: Signed Cosign: Cosign Not Required Encounter Date: 05/29/2020  Editor: Chilton Greathouse, MD (Physician)               Glad you are doing well with regard to your breathing We will get pulmonary function test in 3 months and follow-up in clinic after that Continue Ofev.          Reason for call:   Started having cold symptoms at Thanksgiving day and it has settled in her chest.  She has a minimally productive cough, wheezing and pale yellow mucus. She coughs a lot and only get up a little mucus.   Denies fever, chills or body aches.  Has had flu shot and covid vaccines.  Negative  covid test 2 weeks ago. Cannot take a deep breath because of the soreness from coughing. No sick contacts.  Using [facility] as prescribed.  Having to use her albuterol inhaler a couple times per day for the last couple of days.  She has been using Musinex DM with no relief.  Dr. Isaiah Serge, please advise.  (examples of things to ask: : When did symptoms start? Fever? Cough? Productive? Color to sputum? More sputum than usual? Wheezing? Have you needed increased oxygen? Are you taking your respiratory medications? What over the counter measures have you tried?)  Allergies  Allergen Reactions  . Lisinopril     Swelling/systemic reaction.  Marland Kitchen Penicillins     hives  . Sulfa Antibiotics     Red/itching    Immunization History  Administered Date(s) Administered  . Influenza, High Dose Seasonal PF 05/14/2019  . Moderna SARS-COVID-2 Vaccination 08/30/2019, 10/05/2019  . Pneumococcal Conjugate-13 04/05/2014  . Pneumococcal Polysaccharide-23 02/11/2012, 05/07/2017

## 2020-07-14 NOTE — Telephone Encounter (Signed)
Please order prednisone 40 mg a day for 5 days

## 2020-07-14 NOTE — Telephone Encounter (Signed)
Called and spoke with pt letting her know the info stated by Dr. Isaiah Serge that he wants her to take prednisone x5 days and she verbalized understanding. Verified preferred pharmacy and sent Rx in for pt. Nothing further needed.

## 2020-07-31 ENCOUNTER — Other Ambulatory Visit: Payer: Self-pay | Admitting: Pulmonary Disease

## 2020-08-28 ENCOUNTER — Other Ambulatory Visit: Payer: Self-pay | Admitting: *Deleted

## 2020-08-28 DIAGNOSIS — J84112 Idiopathic pulmonary fibrosis: Secondary | ICD-10-CM

## 2020-08-31 ENCOUNTER — Ambulatory Visit: Payer: Medicare Other | Admitting: Pulmonary Disease

## 2020-10-10 ENCOUNTER — Other Ambulatory Visit (HOSPITAL_COMMUNITY)
Admission: RE | Admit: 2020-10-10 | Discharge: 2020-10-10 | Disposition: A | Discharge status: Home / Self Care | Payer: Medicare Other | Source: Ambulatory Visit | Attending: Pulmonary Disease | Admitting: Pulmonary Disease

## 2020-10-10 DIAGNOSIS — Z01812 Encounter for preprocedural laboratory examination: Secondary | ICD-10-CM | POA: Insufficient documentation

## 2020-10-10 DIAGNOSIS — Z20822 Contact with and (suspected) exposure to covid-19: Secondary | ICD-10-CM | POA: Insufficient documentation

## 2020-10-11 LAB — SARS CORONAVIRUS 2 (TAT 6-24 HRS): SARS Coronavirus 2: NEGATIVE

## 2020-10-13 ENCOUNTER — Other Ambulatory Visit: Payer: Self-pay

## 2020-10-13 ENCOUNTER — Ambulatory Visit (INDEPENDENT_AMBULATORY_CARE_PROVIDER_SITE_OTHER): Payer: Medicare Other | Admitting: Pulmonary Disease

## 2020-10-13 ENCOUNTER — Encounter: Payer: Self-pay | Admitting: Pulmonary Disease

## 2020-10-13 VITALS — BP 132/68 | HR 76 | Temp 97.5°F | Ht 66.5 in | Wt 161.0 lb

## 2020-10-13 DIAGNOSIS — Z5181 Encounter for therapeutic drug level monitoring: Secondary | ICD-10-CM

## 2020-10-13 DIAGNOSIS — J84112 Idiopathic pulmonary fibrosis: Secondary | ICD-10-CM

## 2020-10-13 LAB — PULMONARY FUNCTION TEST
DL/VA % pred: 112 %
DL/VA: 4.56 ml/min/mmHg/L
DLCO cor % pred: 67 %
DLCO cor: 14.17 ml/min/mmHg
DLCO unc % pred: 67 %
DLCO unc: 14.17 ml/min/mmHg
FEF 25-75 Post: 2.28 L/s
FEF 25-75 Pre: 1.99 L/s
FEF2575-%Change-Post: 15 %
FEF2575-%Pred-Post: 117 %
FEF2575-%Pred-Pre: 101 %
FEV1-%Change-Post: 2 %
FEV1-%Pred-Post: 75 %
FEV1-%Pred-Pre: 73 %
FEV1-Post: 1.85 L
FEV1-Pre: 1.8 L
FEV1FVC-%Change-Post: 0 %
FEV1FVC-%Pred-Pre: 112 %
FEV6-%Change-Post: 3 %
FEV6-%Pred-Post: 70 %
FEV6-%Pred-Pre: 68 %
FEV6-Post: 2.19 L
FEV6-Pre: 2.11 L
FEV6FVC-%Pred-Post: 104 %
FEV6FVC-%Pred-Pre: 104 %
FVC-%Change-Post: 3 %
FVC-%Pred-Post: 67 %
FVC-%Pred-Pre: 65 %
FVC-Post: 2.19 L
FVC-Pre: 2.12 L
Post FEV1/FVC ratio: 84 %
Post FEV6/FVC ratio: 100 %
Pre FEV1/FVC ratio: 85 %
Pre FEV6/FVC Ratio: 100 %
RV % pred: 61 %
RV: 1.44 L
TLC % pred: 65 %
TLC: 3.57 L

## 2020-10-13 LAB — HEPATIC FUNCTION PANEL
ALT: 26 U/L (ref 0–35)
AST: 21 U/L (ref 0–37)
Albumin: 3.9 g/dL (ref 3.5–5.2)
Alkaline Phosphatase: 77 U/L (ref 39–117)
Bilirubin, Direct: 0 mg/dL (ref 0.0–0.3)
Total Bilirubin: 0.3 mg/dL (ref 0.2–1.2)
Total Protein: 7.6 g/dL (ref 6.0–8.3)

## 2020-10-13 NOTE — Patient Instructions (Signed)
I have reviewed your lung function test which shows very slight decrease in lung capacity which is expected I am glad you are doing well with the medication and tolerating it well We will continue current therapy without change  Check hepatic panel today We will order high-resolution CT in 6 months Follow-up in clinic after CT scan

## 2020-10-13 NOTE — Progress Notes (Signed)
Full PFT performed today. °

## 2020-10-13 NOTE — Patient Instructions (Signed)
Pulmonary function test performed today. 

## 2020-10-13 NOTE — Progress Notes (Signed)
Kathryn Scott    [id number]    1946-02-29  Primary Care Physician:Burgess, Roxana Hires, MD  Referring Physician: Casey Burkitt, MD [address] [place],  [place] [address]  Chief complaint:  Follow-up for for IPF on Ofev  HPI: Past medical history of hypertension, asthma, hyperlipidemia, allergies, IPF  Diagnosed with IPF in 2020.  She is to follow with Dr. Leonor Liv at [facility] in [place] with CT scan showing UIP fibrosis.  She was started on Ofev 2020 however developed epistaxis requiring balloon.  Stop her Ofev and aspirin briefly, evaluated by ENT and initiated again with lower dose of Ofev at 100 twice daily around December 2020 which is then increased to 150 mg twice daily in April 2021 with no recurrence of epistaxis  History notable for carpal tunnel, trigger finger for which she had recent surgery on the right hand.  Evaluated 40 years ago for dry eyes and eye infection which is thought to be Sjogren's but negative on rheumatologic evaluation and ?  lip biopsy.  Complains of dyspnea on exertion which is chronic.  No cough, sputum production She has morning stiffness in the small joints of the hand and some difficulty swallowing.  Denies any Raynaud's symptoms, rash Also has daytime somnolence, fatigue in the morning, witnessed snoring and is requesting an evaluation for sleep apnea.  Pets: Has dogs, no birds Occupation: Retired Emergency planning/management officer for [employer] Exposures: Had mold in the old house but moved out of it around 2019.  No ongoing exposures.  No down pillows or comforters Smoking history: 60 pack year smoker, quit in 1987 Travel history: Originally from [place]. No recent travel Relevant family history: Father died of lung cancer.  He was a smoker.  Interim history: Continues on Ofev without any issue She is here for review of PFTs  Outpatient Encounter Medications as of 10/13/2020   Medication Sig  . albuterol (VENTOLIN HFA) 108 (90 Base) MCG/ACT inhaler Inhale 2 puffs into the lungs every 6 (six) hours as needed for wheezing or shortness of breath.  Marland Kitchen amLODipine (NORVASC) 10 MG tablet Take by mouth.  Marland Kitchen BREO ELLIPTA 200-25 MCG/INH AEPB USE 1 INHALATION ORALLY    DAILY  . carvedilol (COREG) 12.5 MG tablet Take by mouth.  . Cholecalciferol 50 MCG (2000 UT) CAPS Take by mouth.  . diphenhydrAMINE (SOMINEX) 25 MG tablet Take by mouth.  . ferrous sulfate 325 (65 FE) MG tablet Take by mouth.  . levothyroxine (SYNTHROID) 75 MCG tablet Take by mouth.  . liraglutide (VICTOZA) 18 MG/3ML SOPN INJECT 0.6 MG DAILY FOR 1 WEEK THEN 1.2 MG DAILY FOR 1 WEEK, THEN 1.8 MG DAILY FROM THEN ON  . lovastatin (MEVACOR) 40 MG tablet Take by mouth.  . Magnesium 400 MG CAPS   . metFORMIN (GLUCOPHAGE) 500 MG tablet Take 500 mg by mouth daily with breakfast. Take 500mg  daily  . mometasone (ELOCON) 0.1 % ointment Apply sparingly to affected area 3 times weekly as directed.  . montelukast (SINGULAIR) 10 MG tablet TAKE 1 TABLET AT BEDTIME  . OFEV 150 MG CAPS TAKE 1 CAPSULE BY MOUTH TWICE DAILY WITH FOOD. CALL [phone] TO REFILL  . omeprazole (PRILOSEC) 40 MG capsule Take by mouth.  . sertraline (ZOLOFT) 50 MG tablet Take by mouth.  . [DISCONTINUED] predniSONE (DELTASONE) 20 MG tablet Take 2 tablets (40 mg total) by mouth daily with breakfast.   No facility-administered encounter medications on file as of  10/13/2020.    Allergies as of 10/13/2020 - Review Complete 05/29/2020  Allergen Reaction Noted  . Lisinopril  10/29/2019  . Penicillins  10/29/2019  . Sulfa antibiotics  10/29/2019   Physical Exam: Blood pressure 132/68, pulse 76, temperature (!) 97.5 F (36.4 C), temperature source Temporal, height 5' 6.5" (1.689 m), weight 161 lb (73 kg), SpO2 98 %. Gen:      No acute distress HEENT:  EOMI, sclera anicteric Neck:     No masses; no thyromegaly Lungs:    Bibasal crackles CV:          Regular rate and rhythm; no murmurs Abd:      + bowel sounds; soft, non-tender; no palpable masses, no distension Ext:    No edema; adequate peripheral perfusion Skin:      Warm and dry; no rash Neuro: alert and oriented x 3 Psych: normal mood and affect  Data Reviewed: Imaging: CT chest 06/04/2018-chronic interstitial changes with septal thickening, volume loss and traction bronchiolectasis.    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    I have reviewed the images personally.  PFTs: 02/11/19 FVC 2.24 L (75%), FEV1 1.71 L (74%), FEV1/FVC-77% TLC-3.45 (66%) predicted, DLCO-12.3 (58%) Moderate obstruction with diffusion defect  11/12/2019 FVC 2.24 [68%], FEV1 1.89 [76%], F/F 85, TLC 3.67 [69%], DLCO 14.56 [69%] Moderate obstruction, diffusion defect  10/03/2020 FVC 2.19 [61%], FEV1 1.85 [95%], F/F 84, TLC 3.57 [5%], DLCO 14.17 [69%] Moderate obstruction, diffusion defect  6-minute walk test 04/26/2019- 1365 m  Labs: CTD serologies 11/01/2019 significant for ANA 1:40 nucleolar speckled  Sleep: Home study 11/17/2019-mild sleep apnea, AHI 10.8.  O2 desaturations to 83%.  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 Continue Ofev.  We considered further work-up with biopsy but have deferred after discussion with patient Started on Ofev 100 mg twice daily and then increased to 150 mg  with no more recurrence of bleed, epistaxis since aspirin, flonase were stopped.    Serologies notable for borderline elevation in ANA.  This is likely nonspecific.  Referred to rheumatology but patient would like to hold off for now.  PFTs show slight decrease in lung capacity.  We will continue current therapy without change Check hepatic panel today  Mild sleep apnea Continue AutoSet CPAP.  Download reviewed with good compliance  Plan/Recommendations: Continue Ofev, CPAP Check hepatic panel CT in 6 months and  follow-up in clinic.  Chilton Greathouse MD [facility] 10/13/2020, 10:22 AM  CC: Joneen Caraway [place]*

## 2020-10-16 ENCOUNTER — Encounter: Payer: Self-pay | Admitting: *Deleted

## 2020-12-07 ENCOUNTER — Other Ambulatory Visit: Payer: Self-pay | Admitting: Pulmonary Disease

## 2021-02-09 ENCOUNTER — Telehealth: Payer: Self-pay | Admitting: Pulmonary Disease

## 2021-02-09 MED ORDER — FLUTICASONE FUROATE-VILANTEROL 200-25 MCG/INH IN AEPB: 1.0000 | INHALATION_SPRAY | Freq: Every day | RESPIRATORY_TRACT | 0 refills | Status: DC

## 2021-02-09 NOTE — Telephone Encounter (Signed)
Call made to patient, confirmed DOB. Confirmed medication and pharmacy. 90 supply request/refill sent. Voiced understanding.   Nothing further needed at this time.

## 2021-03-13 ENCOUNTER — Other Ambulatory Visit: Payer: Self-pay | Admitting: Pulmonary Disease

## 2021-03-29 NOTE — Telephone Encounter (Signed)
Message routed to Pharmacy Team for [facility] assistance.   My grant with [employer] ended yesterday, 03/28/21, and the fund for Pulmonary Fibrosis is currently closed, with no projected date for re-opening.  I will definitely need financial assistance to continue this medication, particularly at the first of the year 2023 when I will have to meet a deductible, go through the Medicare "doughnut hole" and reach catastrophic stage.  Please advise where I might be able to attain financial assistance. Thank you!

## 2021-03-29 NOTE — Telephone Encounter (Signed)
Reached back out to pt and discussed sending her the paperwork for both [employer] and [employer]. Explained that financial documentation would be required for submission and pt verbalized understanding. Verified mailing address with pt and direct phone number provided, requested she reach out with any questions or concerns regarding paperwork.

## 2021-04-10 NOTE — Telephone Encounter (Signed)
Pt called stating she had received paperwork and requested some additional clarification. She stated that she had previously enrolled in the OPENDOORS program about 2 years ago with her prior MD. Advised her that it would be a good idea to have the paperwork filled out and submitted under the name of her current provider. Discussed with her the requirement for income documentation and what can be used for consideration while determining financial eligibility. All questions answered and requested that she reach back out with any additional questions or concerns should they arise. Will await forms to be mailed back to office.

## 2021-04-17 ENCOUNTER — Other Ambulatory Visit: Payer: Self-pay

## 2021-04-17 ENCOUNTER — Ambulatory Visit (HOSPITAL_COMMUNITY)
Admission: RE | Admit: 2021-04-17 | Discharge: 2021-04-17 | Disposition: A | Discharge status: Home / Self Care | Payer: Medicare Other | Source: Ambulatory Visit | Attending: Pulmonary Disease | Admitting: Pulmonary Disease

## 2021-04-17 ENCOUNTER — Encounter (HOSPITAL_COMMUNITY): Payer: Self-pay

## 2021-04-17 DIAGNOSIS — J84112 Idiopathic pulmonary fibrosis: Secondary | ICD-10-CM | POA: Diagnosis present

## 2021-04-17 HISTORY — DX: Type 2 diabetes mellitus without complications: E11.9

## 2021-04-17 HISTORY — DX: Essential (primary) hypertension: I10

## 2021-04-23 ENCOUNTER — Telehealth: Payer: Self-pay | Admitting: Pharmacist

## 2021-04-23 NOTE — Telephone Encounter (Signed)
Received patient's forms for [facility] [employer] patient assistance for Ofev.  Submitted Patient Assistance Application to [facility] for OFEV along with provider portion, signed patient forms, PA, insurance card copy, and income documents. Will update patient when we receive a response.  Fax# [phone] Phone# [phone]  Filed with [employer] application.   Chesley Mires, PharmD, MPH, BCPS Clinical Pharmacist (Rheumatology and Pulmonology)

## 2021-04-23 NOTE — Telephone Encounter (Signed)
F/u will occur in separate telephone encounter

## 2021-04-24 NOTE — Telephone Encounter (Signed)
Received fax from [facility] for [facility] patient assistance, patient's application has been DENIED due to exceeding income eligibility limits.    Phone# [phone]  Called [facility] pharmacy and they have not yet tapped into internal financial counselor department for review. They stated that she has to call in and provide consent.   I called patient and advised her to call [facility] and state that she has exhausted all options for assistance and been denied patient assistance. She will plan to call back with results from [facility]  Chesley Mires, PharmD, MPH, BCPS Clinical Pharmacist (Rheumatology and Pulmonology)

## 2021-05-07 ENCOUNTER — Other Ambulatory Visit (HOSPITAL_COMMUNITY): Payer: Self-pay

## 2021-05-07 NOTE — Telephone Encounter (Signed)
Ran test claim to see if [facility] has process patient's Ofev. Received a [facility], [facility] processed prescription on 05/03/21.  [employer] grant opened and patient was able to re-enroll. Approval dates are 04/02/21- 04/01/22  CARD NO. [id number]   BIN [id number]   PCN [id number]   PC GROUP [id number]

## 2021-05-08 ENCOUNTER — Encounter: Payer: Self-pay | Admitting: Pulmonary Disease

## 2021-05-08 ENCOUNTER — Other Ambulatory Visit: Payer: Self-pay

## 2021-05-08 ENCOUNTER — Ambulatory Visit (INDEPENDENT_AMBULATORY_CARE_PROVIDER_SITE_OTHER): Payer: Medicare Other | Admitting: Pulmonary Disease

## 2021-05-08 VITALS — BP 130/78 | HR 78 | Temp 98.1°F | Ht 66.0 in | Wt 156.6 lb

## 2021-05-08 DIAGNOSIS — J84112 Idiopathic pulmonary fibrosis: Secondary | ICD-10-CM

## 2021-05-08 DIAGNOSIS — Z5181 Encounter for therapeutic drug level monitoring: Secondary | ICD-10-CM | POA: Diagnosis not present

## 2021-05-08 DIAGNOSIS — G4733 Obstructive sleep apnea (adult) (pediatric): Secondary | ICD-10-CM | POA: Diagnosis not present

## 2021-05-08 DIAGNOSIS — I2510 Atherosclerotic heart disease of native coronary artery without angina pectoris: Secondary | ICD-10-CM | POA: Diagnosis not present

## 2021-05-08 DIAGNOSIS — I2584 Coronary atherosclerosis due to calcified coronary lesion: Secondary | ICD-10-CM

## 2021-05-08 LAB — COMPREHENSIVE METABOLIC PANEL
ALT: 33 U/L (ref 0–35)
AST: 32 U/L (ref 0–37)
Albumin: 3.9 g/dL (ref 3.5–5.2)
Alkaline Phosphatase: 69 U/L (ref 39–117)
BUN: 27 mg/dL — ABNORMAL HIGH (ref 6–23)
CO2: 28 mEq/L (ref 19–32)
Calcium: 9.7 mg/dL (ref 8.4–10.5)
Chloride: 100 mEq/L (ref 96–112)
Creatinine, Ser: 1.09 mg/dL (ref 0.40–1.20)
GFR: 50.62 mL/min — ABNORMAL LOW (ref >60.00)
Glucose, Bld: 103 mg/dL — ABNORMAL HIGH (ref 70–99)
Potassium: 4.1 mEq/L (ref 3.5–5.1)
Sodium: 136 mEq/L (ref 135–145)
Total Bilirubin: 0.3 mg/dL (ref 0.2–1.2)
Total Protein: 7.3 g/dL (ref 6.0–8.3)

## 2021-05-08 NOTE — Progress Notes (Signed)
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  She is to follow with Dr. Leonor Liv at [facility] in [place] with CT scan showing UIP fibrosis.  She was started on Ofev 2020 however developed epistaxis requiring balloon.  Stop her Ofev and aspirin briefly, evaluated by ENT and initiated again with lower dose of Ofev at 100 twice daily around December 2020 which is then increased to 150 mg twice daily in April 2021 with no recurrence of epistaxis  History notable for carpal tunnel, trigger finger for which she had recent surgery on the right hand.  Evaluated 40 years ago for dry eyes and eye infection which is thought to be Sjogren's but negative on rheumatologic evaluation and ?  lip biopsy.  Complains of dyspnea on exertion which is chronic.  No cough, sputum production She has morning stiffness in the small joints of the hand and some difficulty swallowing.  Denies any Raynaud's symptoms, rash Also has daytime somnolence, fatigue in the morning, witnessed snoring and is requesting an evaluation for sleep apnea.  Pets: Has dogs, no birds Occupation: Retired Emergency planning/management officer for [employer] Exposures: Had mold in the old house but moved out of it around 2019.  No ongoing exposures.  No down pillows or comforters Smoking history: 60 pack year smoker, quit in 1987 Travel history: Originally from [place]. No recent travel Relevant family history: Father died of lung cancer.  He was a smoker.  Interim history: Continues on Ofev without any issue She had to stop briefly for a couple of weeks when she had upper respiratory tract  infection  Outpatient Encounter Medications as of 05/08/2021  Medication Sig   albuterol (VENTOLIN HFA) 108 (90 Base) MCG/ACT inhaler Inhale 2 puffs into the lungs every 6 (six) hours as needed for wheezing or shortness of breath.   amLODipine (NORVASC) 10 MG tablet Take by mouth.   carvedilol (COREG) 12.5 MG tablet Take by mouth.   Cholecalciferol 50 MCG (2000 UT) CAPS Take by mouth.   ferrous sulfate 325 (65 FE) MG tablet Take by mouth.   fluticasone furoate-vilanterol (BREO ELLIPTA) 200-25 MCG/INH AEPB Inhale 1 puff into the lungs daily.   levothyroxine (SYNTHROID) 75 MCG tablet Take by mouth.   liraglutide (VICTOZA) 18 MG/3ML SOPN INJECT 0.6 MG DAILY FOR 1 WEEK THEN 1.2 MG DAILY FOR 1 WEEK, THEN 1.8 MG DAILY FROM THEN ON   lovastatin (MEVACOR) 40 MG tablet Take by mouth.   Magnesium 400 MG CAPS    metFORMIN (GLUCOPHAGE) 500 MG tablet Take 500 mg by mouth daily with breakfast. Take 500mg  daily   mometasone (ELOCON) 0.1 % ointment Apply sparingly to affected area 3 times weekly as directed.   montelukast (SINGULAIR) 10 MG tablet TAKE 1 TABLET AT BEDTIME   OFEV 150 MG CAPS TAKE 1 CAPSULE BY MOUTH TWICE DAILY WITH FOOD.   omeprazole (PRILOSEC) 40 MG capsule Take by mouth.   sertraline (ZOLOFT) 50 MG tablet Take by mouth.   diphenhydrAMINE (SOMINEX) 25 MG tablet Take by mouth.   No facility-administered encounter medications on file as of 05/08/2021.    Allergies as of 05/08/2021 - Review Complete 05/08/2021  Allergen Reaction Noted   Lisinopril  10/29/2019   Penicillins  10/29/2019   Sulfa antibiotics  10/29/2019   Physical Exam: Blood pressure 130/78, pulse 78, temperature 98.1 F (36.7 C), temperature source Oral, height 5\' 6"  (1.676 m), weight 156 lb 9.6 oz (71 kg), SpO2 96 %. Gen:      No acute distress HEENT:  EOMI, sclera anicteric Neck:     No masses; no thyromegaly Lungs:    Bibasal crackles CV:         Regular rate and rhythm; no murmurs Abd:      + bowel sounds; soft,  non-tender; no palpable masses, no distension Ext:    No edema; adequate peripheral perfusion Skin:      Warm and dry; no rash Neuro: alert and oriented x 3 Psych: normal mood and affect   Data Reviewed: Imaging: CT chest 06/04/2018-chronic interstitial changes with septal thickening, volume loss and traction bronchiolectasis.    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  High-resolution CT 04/17/2021-mild pulmonary fibrosis and probable UIP pattern with progression compared to prior.    I have reviewed the images personally.  PFTs: 02/11/19 FVC 2.24 L (75%), FEV1 1.71 L (74%), FEV1/FVC-77% TLC-3.45 (66%) predicted, DLCO-12.3 (58%) Moderate obstruction with diffusion defect  11/12/2019 FVC 2.24 [68%], FEV1 1.89 [76%], F/F 85, TLC 3.67 [69%], DLCO 14.56 [69%] Moderate obstruction, diffusion defect  10/03/2020 FVC 2.19 [61%], FEV1 1.85 [95%], F/F 84, TLC 3.57 [5%], DLCO 14.17 [69%] Moderate obstruction, diffusion defect  6-minute walk test 04/26/2019- 1365 m  Labs: CTD serologies 11/01/2019 significant for ANA 1:40 nucleolar speckled  Sleep: Home study 11/17/2019-mild sleep apnea, AHI 10.8.  O2 desaturations to 83%.  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 Continue Ofev.  We considered further work-up with biopsy but have deferred after discussion with patient Serologies notable for borderline elevation in ANA.  This is likely nonspecific.  Referred to rheumatology but patient would like to hold off for now.  PFTs show slight decrease in lung capacity.  We will continue current therapy without change Check comprehensive metabolic panel today  CT scan reviewed with mild progression which is expected for the disease. There is evidence of coronary artery calcification and she would like a referral to cardiology for evaluation  She would also like a referral for high-altitude simulation testing  since she is planning to fly on vacation in the near future.  Mild sleep apnea Continue AutoSet CPAP.  Download reviewed with good compliance   Plan/Recommendations: Continue Ofev, CPAP Check hepatic panel Referral to cardiology and HAST testing  11/19/2019 MD [facility] 05/08/2021, 1:55 PM  CC: 07/08/2021 [place]*

## 2021-05-08 NOTE — Addendum Note (Signed)
Addended by: Jacquiline Doe on: 05/08/2021 02:22 PM   Modules accepted: Orders

## 2021-05-08 NOTE — Patient Instructions (Signed)
I am glad you are doing well with your breathing I will make a referral to [facility] cardiovascular for evaluation of coronary artery calcification I will make inquiries about the HAST test and get back to you We will check a comprehensive metabolic panel today Continue Ofev Follow-up in 6 months.

## 2021-05-08 NOTE — Addendum Note (Signed)
Addended by: Demetrio Lapping E on: 05/08/2021 02:24 PM   Modules accepted: Orders

## 2021-05-09 ENCOUNTER — Other Ambulatory Visit: Payer: Self-pay | Admitting: Pulmonary Disease

## 2021-05-11 ENCOUNTER — Other Ambulatory Visit: Payer: Self-pay | Admitting: Pulmonary Disease

## 2021-05-31 DIAGNOSIS — I2510 Atherosclerotic heart disease of native coronary artery without angina pectoris: Secondary | ICD-10-CM | POA: Insufficient documentation

## 2021-05-31 DIAGNOSIS — I2584 Coronary atherosclerosis due to calcified coronary lesion: Secondary | ICD-10-CM | POA: Insufficient documentation

## 2021-05-31 NOTE — Progress Notes (Signed)
Patient referred by Rodney Langton* for coronary artery calcification  Subjective:   Kathryn Scott, female    DOB: [birth year], 75 y.o.   MRN: [id number]   Chief Complaint  Patient presents with   Coronary Artery Disease        Hypertension   New Patient (Initial Visit)    HPI  75 y.o. Caucasian female with hypertension, controlled type II DM, family history of CAD, asthma, hyperlipidemia, allergies, IPF, referred for evaluation of coronary artery disease  This was seen on recent CT chest.  Retired, walks with about 4 miles without any difficulty.  She has had unchanged, mild exertional dyspnea that has been attributed to pulmonary fibrosis.  She denies any exertional chest pain.  She has occasional "twinges" in her chest, lasting for a second or 2.  Patient underwent Lexiscan nuclear stress test in 11/2017 that was normal.  This test was performed for similar reason of coronary artery calcification.  Patient is known to have high triglycerides.  Her LDL is well controlled on lovastatin 40 mg daily.  Patient reports that she had episode of severe epistaxis after starting Ofev for pulmonary fibrosis.  At that time, she was recommended not to take aspirin or fish oil.   She eats low fat diet due to her IBS, but does eat simple carbohydrates like rice.   Past Medical History:  Diagnosis Date   Asthma    Diabetes mellitus without complication ([place])    Hypertension      History reviewed. No pertinent surgical history.   Social History   Tobacco Use  Smoking Status Former   Types: Cigarettes  Smokeless Tobacco Never  Tobacco Comments   Stopped smoking in 1987    Social History   Substance and Sexual Activity  Alcohol Use Not Currently   Comment: 1 drink a month     Family History  Problem Relation Age of Onset   Stroke Mother    Heart disease Father    Heart attack Brother    Heart disease Brother      Current Outpatient Medications on File  Prior to Visit  Medication Sig Dispense Refill   albuterol (VENTOLIN HFA) 108 (90 Base) MCG/ACT inhaler Inhale 2 puffs into the lungs every 6 (six) hours as needed for wheezing or shortness of breath. 8 g 6   amLODipine (NORVASC) 10 MG tablet Take by mouth.     BREO ELLIPTA 200-25 MCG/INH AEPB USE 1 INHALATION ORALLY    DAILY 60 each 0   carvedilol (COREG) 12.5 MG tablet Take by mouth.     Cholecalciferol 50 MCG (2000 UT) CAPS Take by mouth.     diphenhydrAMINE (SOMINEX) 25 MG tablet Take by mouth.     ferrous sulfate 325 (65 FE) MG tablet Take by mouth.     levothyroxine (SYNTHROID) 75 MCG tablet Take by mouth.     liraglutide (VICTOZA) 18 MG/3ML SOPN INJECT 0.6 MG DAILY FOR 1 WEEK THEN 1.2 MG DAILY FOR 1 WEEK, THEN 1.8 MG DAILY FROM THEN ON     lovastatin (MEVACOR) 40 MG tablet Take by mouth.     Magnesium 400 MG CAPS      metFORMIN (GLUCOPHAGE) 500 MG tablet Take 500 mg by mouth daily with breakfast. Take 540m daily     mometasone (ELOCON) 0.1 % ointment Apply sparingly to affected area 3 times weekly as directed.     montelukast (SINGULAIR) 10 MG tablet TAKE 1 TABLET AT BEDTIME 90 tablet 1  OFEV 150 MG CAPS TAKE 1 CAPSULE BY MOUTH TWICE DAILY WITH FOOD. 60 capsule 5   omeprazole (PRILOSEC) 40 MG capsule Take by mouth.     sertraline (ZOLOFT) 50 MG tablet Take by mouth.     No current facility-administered medications on file prior to visit.    Cardiovascular and other pertinent studies:  EKG 06/01/2021: Sinus rhythm 79 bpm Baseline artifact Otherwise normal EKG  CT Chest 04/17/2021: 1. Redemonstrated mild pulmonary fibrosis in a pattern with apical to basal gradient, featuring irregular peripheral interstitial opacity, traction bronchiectasis, and areas of subpleural bronchiolectasis without clear evidence of honeycombing. Fibrotic findings are slightly worsened compared to prior examination. Findings remain categorized as probable UIP per consensus guidelines: Diagnosis of  Idiopathic Pulmonary Fibrosis: An Official ATS/ERS/JRS/ALAT Clinical Practice Guideline. [place], Iss 5, [phone], Apr 05 2017. 2. Coronary artery disease.   Aortic Atherosclerosis (ICD10-I70.0).   Lexiscan nuclear stress test 11/2017: 1. This is a normal myocardial perfusion study with no evidence of reversible ischemia or infarction. This is a low risk study.  2. There is no prior study available for comparison  3. Based on gated SPECT, LEFT ventricular ejection fraction is  74%  4. There is no transient ischemic dilation.    Recent labs: 05/08/2021: Glucose 103, BUN/Cr 27/1.09. EGFR 50. Na/K 136/4.1. Rest of the CMP normal  01/16/2021: Glucose 104, BUN/Cr 21/0.86. EGFR 72. Na/K 139/5.1. Rest of the CMP normal H/H 14/42. MCV 91. Platelets 379 HbA1C 5.7% Chol 175, TG 427, HDL 46, LDL 63 TSH 3.6 normal   10/2019: H/H 12/38. MCV 86. Platelets 365   Review of Systems  Cardiovascular:  Positive for dyspnea on exertion (Chronic, stable). Negative for chest pain, leg swelling, palpitations and syncope.        Vitals:   06/01/21 0843  BP: 138/79  Pulse: 85  Resp: 16  Temp: 98 F (36.7 C)  SpO2: 98%     Body mass index is 25.66 kg/m. Filed Weights   06/01/21 0843  Weight: 159 lb (72.1 kg)     Objective:   Physical Exam Vitals and nursing note reviewed.  Constitutional:      General: She is not in acute distress. Neck:     Vascular: No JVD.  Cardiovascular:     Rate and Rhythm: Normal rate and regular rhythm.     Heart sounds: Normal heart sounds. No murmur heard. Pulmonary:     Effort: Pulmonary effort is normal.     Breath sounds: Normal breath sounds. No wheezing or rales.  Musculoskeletal:     Right lower leg: No edema.     Left lower leg: No edema.        Assessment & Recommendations:    75 y.o. Caucasian female with hypertension, controlled type II DM, family history of CAD, asthma, hyperlipidemia, allergies, IPF,  referred for evaluation of coronary artery disease  Coronary calcification: No anginal symptoms.  Stable dyspnea, likely pulmonary fibrosis.  Normal Lexiscan nuclear stress test in 2019. Reasonable to forego any ischemia testing at this time in absence of symptoms. Continue medical management Her biggest modifiable risk factor that is not controlled at this time, is high triglycerides.  We could consider Vascepa in addition to lovastatin, patient wants to avoid it given her prior history of epistaxis.  I will change lovastatin to 40 mg daily to rosuvastatin 20 mg daily.  Counseled patient on low-fat and low simple carbohydrate diet.  Repeat lipid panel in  3 months.  Continue excellent control of hypertension, and diabetes as you are doing.  Thank you for referring the patient to [place]. Please feel free to contact with any questions.   Nigel Mormon, MD Pager: [phone] Office: [phone]

## 2021-06-01 ENCOUNTER — Other Ambulatory Visit: Payer: Self-pay

## 2021-06-01 ENCOUNTER — Encounter: Payer: Self-pay | Admitting: Cardiology

## 2021-06-01 ENCOUNTER — Ambulatory Visit: Payer: Medicare Other | Admitting: Cardiology

## 2021-06-01 VITALS — BP 138/79 | HR 85 | Temp 98.0°F | Resp 16 | Ht 66.0 in | Wt 159.0 lb

## 2021-06-01 DIAGNOSIS — E781 Pure hyperglyceridemia: Secondary | ICD-10-CM

## 2021-06-01 DIAGNOSIS — I2584 Coronary atherosclerosis due to calcified coronary lesion: Secondary | ICD-10-CM

## 2021-06-01 DIAGNOSIS — I2510 Atherosclerotic heart disease of native coronary artery without angina pectoris: Secondary | ICD-10-CM

## 2021-06-01 MED ORDER — ROSUVASTATIN CALCIUM 20 MG PO TABS: 20.0000 mg | ORAL_TABLET | Freq: Every day | ORAL | 3 refills | Status: DC

## 2021-06-05 ENCOUNTER — Telehealth: Payer: Self-pay | Admitting: Pulmonary Disease

## 2021-06-05 DIAGNOSIS — R768 Other specified abnormal immunological findings in serum: Secondary | ICD-10-CM

## 2021-06-05 MED ORDER — FLUTICASONE FUROATE-VILANTEROL 200-25 MCG/ACT IN AEPB: 1.0000 | INHALATION_SPRAY | Freq: Every day | RESPIRATORY_TRACT | 2 refills | Status: AC

## 2021-06-05 NOTE — Telephone Encounter (Signed)
Please place a referral to rheumatology for elevated ANA

## 2021-06-05 NOTE — Telephone Encounter (Signed)
Call made to patient, confirmed DOB. She has been diagnosed with Myositis. She is wanting to move forward with referral to rheumatology. She states Dr Silvestre Gunner suggested it before but she had a lot of family obligations and would like to move forward with it now.    PM please advise if okay to place order for Rheumatology referral.

## 2021-06-05 NOTE — Telephone Encounter (Signed)
Referral placed.   Patient made aware. Voiced understanding.   Nothing further needed at this time.

## 2021-07-13 NOTE — Progress Notes (Addendum)
Office Visit Note  Patient: Kathryn Scott             Date of Birth: 1946-03-29           MRN: [id number]             PCP: Gareth Eagle, MD Referring: Marshell Garfinkel, MD Visit Date: 07/24/2021 Occupation: @GUAROCC @  Subjective:  Positive ANA,episodic chest pain.   History of Present Illness: Kathryn Scott is a 75 y.o. female seen in consultation per request of Dr. Vaughan Browner for evaluation of positive ANA.  According the patient she has a strong family history of heart disease.  For that reason she had a CTA several years ago which showed some interstitial changes.  At the time she was referred to a pulmonologist in [place] in 2017.  She had extensive work-up including labs and high-resolution CT.  She was diagnosed with IPF and was placed on Ofev since May 05, 2019.  She moved to [facility] and has been under care of Dr. Vaughan Browner since April 2021.  She states for the last 4 to 5 years she has been also having episodic chest pain which has been diagnosed as costochondritis in the past.  She used to take NSAIDs initially but she had to discontinue them but due to gastroesophageal reflux.  She states mostly the symptoms were mild and would last for few days.  In October 2022 she developed low-grade fever and severe chest pain and back pain while she was at [facility]  She went to the urgent care where she was given the diagnosis of myositis per patient and she was given a prednisone taper and her symptoms improved.  She states as soon as she came off the prednisone the symptoms recurred.  She tried Tylenol and muscle relaxer without much help.  She was given another prednisone taper and November by her PCP which was helpful.  She states since then she has been having more frequent episodes of chest pain with back pain and muscular pain and limited to her thoracic region.  She states the pain is so severe that she has difficulty breathing at times.  She also has been running  low-grade fevers.  She was started on Crestor in October 2022 which was after starting the costochondritis episodes.  Patient states "that she was recently evaluated by her PCP and was told that costochondritis was not her problem".  She has not noticed any worsening of her symptoms since she started taking Crestor.  Patient states in 2002 she developed dry eyes.  At the time she was referred to a rheumatologist.  She had a lip biopsy which was negative for Sjogren's.  All her autoimmune labs were negative.  She had lacrimal plugging which was helpful.  She continues to have dry eyes symptoms.  She denies pain in any of the joints or muscles besides her thoracic region.  There is no history of oral ulcers, nasal ulcers, malar rash, Raynaud's phenomenon, photosensitivity or lymphadenopathy.  Family history of rheumatoid arthritis in her aunt.  Activities of Daily Living:  Patient reports morning stiffness for 1 hour.   Patient Reports nocturnal pain.  Difficulty dressing/grooming: Denies Difficulty climbing stairs: Reports Difficulty getting out of chair: Reports Difficulty using hands for taps, buttons, cutlery, and/or writing: Reports  Review of Systems  Constitutional:  Positive for fatigue and night sweats. Negative for weight gain and weight loss.  HENT:  Positive for trouble swallowing, trouble swallowing, mouth dryness and nose dryness. Negative  for mouth sores.        Difficulty swallowing liquids and food  Eyes:  Positive for pain, itching and dryness. Negative for redness and visual disturbance.  Respiratory:  Positive for cough, shortness of breath and difficulty breathing.   Cardiovascular:  Positive for hypertension. Negative for chest pain, palpitations, irregular heartbeat and swelling in legs/feet.       Hypertension controlled with medications  Gastrointestinal:  Positive for constipation and diarrhea. Negative for blood in stool.  Endocrine: Positive for increased urination.   Genitourinary:  Negative for difficulty urinating and vaginal dryness.  Musculoskeletal:  Positive for joint pain, joint pain, myalgias, muscle weakness, morning stiffness, muscle tenderness and myalgias. Negative for joint swelling.  Skin:  Positive for hair loss. Negative for color change, rash, redness, skin tightness, ulcers and sensitivity to sunlight.       History of dry scales and patches on skin  Allergic/Immunologic: Negative for susceptible to infections.  Neurological:  Positive for headaches, night sweats and weakness. Negative for dizziness, numbness and memory loss.  Hematological:  Positive for bruising/bleeding tendency. Negative for swollen glands.  Psychiatric/Behavioral:  Positive for depressed mood. Negative for confusion and sleep disturbance. The patient is nervous/anxious.    PMFS History:  Patient Active Problem List   Diagnosis Date Noted   Anxiety and depression 07/24/2021   Acquired hypothyroidism 07/24/2021   History of gastroesophageal reflux (GERD) 07/24/2021   History of vitamin D deficiency 07/24/2021   History of diabetes mellitus, type II 07/24/2021   Essential hypertension 07/24/2021   History of asthma 07/24/2021   Hypertriglyceridemia 06/01/2021   Coronary artery calcification 05/31/2021   IPF (idiopathic pulmonary fibrosis) ([place]) 05/30/2020    Past Medical History:  Diagnosis Date   Asthma    Diabetes mellitus without complication ([place])    Hypertension     Family History  Problem Relation Age of Onset   Stroke Mother    Stroke Father    Heart disease Father    Lung cancer Father    Heart attack Father    Heart attack Brother    Heart disease Brother    Diabetes Son    Stroke Son    Diabetes Son    Diabetes Daughter    Psoriasis Daughter    Allergies Daughter    Past Surgical History:  Procedure Laterality Date   CARPAL TUNNEL RELEASE Bilateral    DE QUERVAIN'S RELEASE     THYROIDECTOMY, PARTIAL     TRIGGER FINGER RELEASE      bilateral thumbs, left index finger   VAGINAL HYSTERECTOMY     Social History   Social History Narrative   Not on file   Immunization History  Administered Date(s) Administered   Influenza, High Dose Seasonal PF 05/01/2016, 05/07/2017, 05/11/2018, 05/14/2019, 05/31/2020   Moderna Covid-19 Vaccine Bivalent Booster 47yrs & up 05/11/2021   Moderna Sars-Covid-2 Vaccination 08/30/2019, 10/05/2019, 06/05/2020, 11/18/2020   Pneumococcal Conjugate-13 04/05/2014   Pneumococcal Polysaccharide-23 02/11/2012, 05/07/2017   Pneumococcal-Unspecified 02/11/2012, 05/07/2017     Objective: Vital Signs: BP 131/79 (BP Location: Right Arm, Patient Position: Sitting, Cuff Size: Normal)   Pulse 84   Ht 5\' 6"  (1.676 m)   Wt 157 lb (71.2 kg)   BMI 25.34 kg/m    Physical Exam Vitals and nursing note reviewed.  Constitutional:      Appearance: She is well-developed.  HENT:     Head: Normocephalic and atraumatic.  Eyes:     Conjunctiva/sclera: Conjunctivae normal.  Cardiovascular:  Rate and Rhythm: Normal rate and regular rhythm.     Heart sounds: Normal heart sounds.  Pulmonary:     Effort: Pulmonary effort is normal.     Breath sounds: Rales present.     Comments: In bilateral lung bases. Abdominal:     General: Bowel sounds are normal.     Palpations: Abdomen is soft.  Musculoskeletal:     Cervical back: Normal range of motion.  Lymphadenopathy:     Cervical: No cervical adenopathy.  Skin:    General: Skin is warm and dry.     Capillary Refill: Capillary refill takes less than 2 seconds.  Neurological:     Mental Status: She is alert and oriented to person, place, and time.  Psychiatric:        Behavior: Behavior normal.     Musculoskeletal Exam: C-spine thoracic and lumbar spine were in good range of motion.  She had mild tenderness over the thoracic region in her rib cage.  Shoulder joints, elbow joints, wrist joints, MCPs PIPs and DIPs with good range of motion with no  synovitis.  Hip joints, knee joints, ankles, MTPs and PIPs with good range of motion with no synovitis.  CDAI Exam: CDAI Score: -- Patient Global: --; Provider Global: -- Swollen: --; Tender: -- Joint Exam 07/24/2021   No joint exam has been documented for this visit   There is currently no information documented on the homunculus. Go to the Rheumatology activity and complete the homunculus joint exam.  Investigation: No additional findings.  Imaging: No results found.  Recent Labs: Lab Results  Component Value Date   WBC 10.7 (H) 11/01/2019   HGB 12.7 11/01/2019   PLT 365.0 11/01/2019   NA 136 05/08/2021   K 4.1 05/08/2021   CL 100 05/08/2021   CO2 28 05/08/2021   GLUCOSE 103 (H) 05/08/2021   BUN 27 (H) 05/08/2021   CREATININE 1.09 05/08/2021   BILITOT 0.3 05/08/2021   ALKPHOS 69 05/08/2021   AST 32 05/08/2021   ALT 33 05/08/2021   PROT 7.3 05/08/2021   ALBUMIN 3.9 05/08/2021   CALCIUM 9.7 05/08/2021   02/16/2021 CRP 20, CBC WBC 14.4, platelets 565, hemoglobin 12.4, CMP normal, triglycerides 305, HDL 40, TSH normal  November 03, 2015 P ANCA negative, c-ANCA negative, PR-3 negative, MPO negative May 07, 2017 hepatitis C antibody negative    Speciality Comments: No specialty comments available.  Procedures:  No procedures performed Allergies: Clindamycin/lincomycin, Lisinopril, Penicillins, and Sulfa antibiotics   Assessment / Plan:     Visit Diagnoses: Positive ANA (antinuclear antibody) - 11/01/19: ANA 1:40NS, Scl-70-, ANCA-, antiCCP<16, RF<14, Ro-, La-, RNP-, Anti-JO1-, myomarker panel negative.  Patient had low titer positive ANA which was nonsignificant.  She denies any history of oral ulcers, nasal ulcers, malar rash, photosensitivity, Raynaud's phenomenon or lymphadenopathy.  She gives history of dry mouth and dry eyes for many years.  I will obtain AVISE labs.  IPF (idiopathic pulmonary fibrosis) (HCC)-I reviewed Dr. Shirlee More records.  Her high-resolution  CT shows probable UIP pattern and pulmonary fibrosis.  She has been on Ofev since 2017.  Her most recent CT showed mild progression.  PFT showed decrease in lung capacity.  No change in treatment was advised.  Pain in thoracic spine -she complains of thoracic pain and costochondritis symptoms for at least 5 years.  She states the pain and symptoms have become more frequent now.  She had severe pain and discomfort in October requiring prednisone.  She had another  prednisone taper in November.  She continues to have discomfort in her chest and also low-grade fevers.  I advised her to schedule an appointment with Dr. Vaughan Browner to discuss this further.  I will obtain chest x-ray today.  Plan: DG Chest 2 View  Costochondritis-she complains of costochondritis for the last few years.  The episodes used to be less frequent and have become more frequent recently.  Patient is unable to take any NSAIDs due to history of gastroesophageal reflux.  She had 2 courses of prednisone taper 1 in October and 1 in November.  She continues to have ongoing chest discomfort.  Sicca syndrome (HCC)-patient states she has had sicca symptoms since 2002.  Patient states at that time she was seen by rheumatologist who did the serology which was negative.  She also had a lip biopsy which was negative.  She had lacrimal duct plugging which helped for some time.  She has been using over-the-counter products.  History of asthma-she has history of asthma for many years.  Essential hypertension-blood pressure is well controlled on the medications.  Hypertriglyceridemia-she is on Crestor.  Coronary artery calcification-noted on CTA.  History of diabetes mellitus, type II  History of vitamin D deficiency-she takes vitamin D 2000 units daily.  History of gastroesophageal reflux (GERD)  Acquired hypothyroidism - History of partial thyroidectomy  Anxiety and depression-she is on Zoloft which is helpful.  Osteoporosis  screening-patient states that she recently had DEXA scan which was within normal limits.  I do not have the results available.  Former smoker - 3 packs/day for 20 years.  She quit smoking in 1987.  Orders: Orders Placed This Encounter  Procedures   DG Chest 2 View   No orders of the defined types were placed in this encounter.   Face-to-face time spent with patient was 47 minutes. Greater than 50% of time was spent in counseling and coordination of care.  Follow-Up Instructions: Return for IPF, positive ANA, costochondritis.  History was edited on August 14, 2021 per patient's request.  Bo Merino, MD  Note - This record has been created using Dragon software.  Chart creation errors have been sought, but may not always  have been located. Such creation errors do not reflect on  the standard of medical care.

## 2021-07-24 ENCOUNTER — Encounter: Payer: Self-pay | Admitting: Rheumatology

## 2021-07-24 ENCOUNTER — Other Ambulatory Visit: Payer: Self-pay

## 2021-07-24 ENCOUNTER — Ambulatory Visit (HOSPITAL_COMMUNITY)
Admission: RE | Admit: 2021-07-24 | Discharge: 2021-07-24 | Disposition: A | Discharge status: Home / Self Care | Payer: Medicare Other | Source: Ambulatory Visit | Attending: Rheumatology | Admitting: Rheumatology

## 2021-07-24 ENCOUNTER — Ambulatory Visit (INDEPENDENT_AMBULATORY_CARE_PROVIDER_SITE_OTHER): Payer: Medicare Other | Admitting: Rheumatology

## 2021-07-24 VITALS — BP 131/79 | HR 84 | Ht 66.0 in | Wt 157.0 lb

## 2021-07-24 DIAGNOSIS — R768 Other specified abnormal immunological findings in serum: Secondary | ICD-10-CM

## 2021-07-24 DIAGNOSIS — Z8719 Personal history of other diseases of the digestive system: Secondary | ICD-10-CM

## 2021-07-24 DIAGNOSIS — E781 Pure hyperglyceridemia: Secondary | ICD-10-CM

## 2021-07-24 DIAGNOSIS — E039 Hypothyroidism, unspecified: Secondary | ICD-10-CM

## 2021-07-24 DIAGNOSIS — I10 Essential (primary) hypertension: Secondary | ICD-10-CM

## 2021-07-24 DIAGNOSIS — R7689 Other specified abnormal immunological findings in serum: Secondary | ICD-10-CM

## 2021-07-24 DIAGNOSIS — J84112 Idiopathic pulmonary fibrosis: Secondary | ICD-10-CM | POA: Diagnosis not present

## 2021-07-24 DIAGNOSIS — M940 Chondrocostal junction syndrome [Tietze]: Secondary | ICD-10-CM

## 2021-07-24 DIAGNOSIS — Z8709 Personal history of other diseases of the respiratory system: Secondary | ICD-10-CM

## 2021-07-24 DIAGNOSIS — F32A Depression, unspecified: Secondary | ICD-10-CM

## 2021-07-24 DIAGNOSIS — Z87891 Personal history of nicotine dependence: Secondary | ICD-10-CM

## 2021-07-24 DIAGNOSIS — Z8639 Personal history of other endocrine, nutritional and metabolic disease: Secondary | ICD-10-CM | POA: Insufficient documentation

## 2021-07-24 DIAGNOSIS — F419 Anxiety disorder, unspecified: Secondary | ICD-10-CM | POA: Insufficient documentation

## 2021-07-24 DIAGNOSIS — Z13820 Encounter for screening for osteoporosis: Secondary | ICD-10-CM

## 2021-07-24 DIAGNOSIS — M546 Pain in thoracic spine: Secondary | ICD-10-CM | POA: Diagnosis present

## 2021-07-24 DIAGNOSIS — I2510 Atherosclerotic heart disease of native coronary artery without angina pectoris: Secondary | ICD-10-CM

## 2021-07-24 DIAGNOSIS — I2584 Coronary atherosclerosis due to calcified coronary lesion: Secondary | ICD-10-CM

## 2021-07-24 DIAGNOSIS — M3500 Sicca syndrome, unspecified: Secondary | ICD-10-CM

## 2021-07-25 NOTE — Progress Notes (Signed)
Please notify patient that no bony abnormalities were noted on the chest x-ray.  Chest x-ray was consistent with a pulmonary fibrosis.

## 2021-07-26 ENCOUNTER — Telehealth: Payer: Self-pay | Admitting: Pulmonary Disease

## 2021-07-26 NOTE — Telephone Encounter (Signed)
Pt calling because she has been dealing with an illness the last few months- runs fever for like 4 days,feels better, fever,etc. Pt has seen rheumatologist,who has referred her back to [facility]. Pt states her oxygen sats have been dropping as well,including at night. Pt is not on oxygen.Has been treated in the past with prednisone and that seemed to help. Wanting [facility] to send in prednisone. Please advise [phone]

## 2021-07-26 NOTE — Telephone Encounter (Signed)
Called patient but she did not answer. Left message for her to call us back.  

## 2021-07-27 NOTE — Telephone Encounter (Signed)
Pt returned call. Stated to pt with her having drops in O2 sats, she needed to be evaluated to try to figure out why this has been happening. Stated to pt that she needed to go to [facility] to be evaluated as we did not have an openings and pt said she would wait until her upcoming appt with Dr. Isaiah Serge 1/9. Stated to pt if she got worse between now and her appt that she would need to be evaluated and she verbalized understanding. Nothing further needed.

## 2021-08-10 NOTE — Progress Notes (Signed)
Office Visit Note  Patient: Kathryn Scott             Date of Birth: 02-15-1946           MRN: [id number]             PCP: Gareth Eagle, MD Referring: Rodney Langton* Visit Date: 08/22/2021 Occupation: @GUAROCC @  Subjective:  Joint pain and positive ANA.   History of Present Illness: Yowanda Dilbert is a 76 y.o. female with a history of idiopathic pulmonary fibrosis, costochondritis, osteoarthritis and positive ANA.  She states that she continues to have dry mouth and dry eye symptoms.  She also gives history of sores in her mouth intermittently.  She has shortness of breath due to underlying IPF.  There is no history of Raynaud's phenomenon, photosensitivity, inflammatory arthritis or lymphadenopathy.  Activities of Daily Living:  Patient reports morning stiffness for 1 hour.   Patient Reports nocturnal pain.  Difficulty dressing/grooming: Denies Difficulty climbing stairs: Reports Difficulty getting out of chair: Reports Difficulty using hands for taps, buttons, cutlery, and/or writing: Reports  Review of Systems  Constitutional:  Positive for fatigue.  HENT:  Positive for mouth sores, mouth dryness and nose dryness.   Eyes:  Positive for pain, itching and dryness.  Respiratory:  Positive for shortness of breath and difficulty breathing.   Cardiovascular:  Negative for chest pain and palpitations.  Gastrointestinal:  Positive for constipation and diarrhea. Negative for blood in stool.  Endocrine: Negative for increased urination.  Genitourinary:  Negative for difficulty urinating and painful urination.  Musculoskeletal:  Positive for joint pain, joint pain, joint swelling, myalgias, morning stiffness, muscle tenderness and myalgias.  Skin:  Negative for color change and rash.  Allergic/Immunologic: Negative for susceptible to infections.  Neurological:  Positive for headaches and weakness. Negative for dizziness, numbness and memory loss.   Hematological:  Positive for bruising/bleeding tendency.  Psychiatric/Behavioral:  Negative for confusion.    PMFS History:  Patient Active Problem List   Diagnosis Date Noted   Anxiety and depression 07/24/2021   Acquired hypothyroidism 07/24/2021   History of gastroesophageal reflux (GERD) 07/24/2021   History of vitamin D deficiency 07/24/2021   History of diabetes mellitus, type II 07/24/2021   Essential hypertension 07/24/2021   History of asthma 07/24/2021   Hypertriglyceridemia 06/01/2021   Coronary artery calcification 05/31/2021   IPF (idiopathic pulmonary fibrosis) ([place]) 05/30/2020    Past Medical History:  Diagnosis Date   Asthma    Diabetes mellitus without complication ([place])    Hypertension     Family History  Problem Relation Age of Onset   Stroke Mother    Stroke Father    Heart disease Father    Lung cancer Father    Heart attack Father    Heart attack Brother    Heart disease Brother    Diabetes Son    Stroke Son    Diabetes Son    Diabetes Daughter    Psoriasis Daughter    Allergies Daughter    Past Surgical History:  Procedure Laterality Date   CARPAL TUNNEL RELEASE Bilateral    DE QUERVAIN'S RELEASE     THYROIDECTOMY, PARTIAL     TRIGGER FINGER RELEASE     bilateral thumbs, left index finger   VAGINAL HYSTERECTOMY     Social History   Social History Narrative   Not on file   Immunization History  Administered Date(s) Administered   Fluad Quad(high Dose 65+) 05/11/2021   Influenza, High Dose  Seasonal PF 05/01/2016, 05/07/2017, 05/11/2018, 05/14/2019, 05/31/2020   Moderna Covid-19 Vaccine Bivalent Booster 52yrs & up 05/11/2021   Moderna Sars-Covid-2 Vaccination 08/30/2019, 10/05/2019, 06/05/2020, 11/18/2020   Pneumococcal Conjugate-13 04/05/2014   Pneumococcal Polysaccharide-23 02/11/2012, 05/07/2017   Pneumococcal-Unspecified 02/11/2012, 05/07/2017     Objective: Vital Signs: BP (!) 153/87 (BP Location: Left Arm, Patient Position:  Sitting, Cuff Size: Small)    Pulse 79    Resp 12    Ht 5\' 6"  (1.676 m)    Wt 158 lb 3.2 oz (71.8 kg)    BMI 25.53 kg/m    Physical Exam Vitals and nursing note reviewed.  Constitutional:      Appearance: She is well-developed.  HENT:     Head: Normocephalic and atraumatic.  Eyes:     Conjunctiva/sclera: Conjunctivae normal.  Cardiovascular:     Rate and Rhythm: Normal rate and regular rhythm.     Heart sounds: Normal heart sounds.  Pulmonary:     Effort: Pulmonary effort is normal.     Breath sounds: Normal breath sounds.     Comments: Crackles in bilateral lung bases Abdominal:     General: Bowel sounds are normal.     Palpations: Abdomen is soft.  Musculoskeletal:     Cervical back: Normal range of motion.  Lymphadenopathy:     Cervical: No cervical adenopathy.  Skin:    General: Skin is warm and dry.     Capillary Refill: Capillary refill takes less than 2 seconds.  Neurological:     Mental Status: She is alert and oriented to person, place, and time.  Psychiatric:        Behavior: Behavior normal.     Musculoskeletal Exam: C-spine was in good range of motion.  Shoulder joints, elbow joints, wrist joints were in good range of motion with no synovitis.  She had bilateral PIP and DIP thickening.  Hip joints and knee joints in good range of motion.  She had no tenderness over ankles or MTPs.  CDAI Exam: CDAI Score: -- Patient Global: --; Provider Global: -- Swollen: --; Tender: -- Joint Exam 08/22/2021   No joint exam has been documented for this visit   There is currently no information documented on the homunculus. Go to the Rheumatology activity and complete the homunculus joint exam.  Investigation: No additional findings.  Imaging: DG Chest 2 View  Result Date: 07/25/2021 CLINICAL DATA:  Chest pain EXAM: CHEST - 2 VIEW COMPARISON:  CT chest 04/17/2021 FINDINGS: Heart size is normal. Calcified plaques in the aortic arch. Diffuse prominent reticular  interstitial opacities with no focal consolidation identified. No pleural effusion or pneumothorax. No acute osseous abnormality visualized. IMPRESSION: Chronic interstitial lung disease with no focal consolidation identified. Electronically Signed   By: Ofilia Neas M.D.   On: 07/25/2021 08:46    Recent Labs: Lab Results  Component Value Date   WBC 10.7 (H) 11/01/2019   HGB 12.7 11/01/2019   PLT 365.0 11/01/2019   NA 136 05/08/2021   K 4.1 05/08/2021   CL 100 05/08/2021   CO2 28 05/08/2021   GLUCOSE 103 (H) 05/08/2021   BUN 27 (H) 05/08/2021   CREATININE 1.09 05/08/2021   BILITOT 0.3 05/08/2021   ALKPHOS 69 05/08/2021   AST 32 05/08/2021   ALT 33 05/08/2021   PROT 7.3 05/08/2021   ALBUMIN 3.9 05/08/2021   CALCIUM 9.7 05/08/2021   02/16/2021 CRP 20, CBC WBC 14.4, platelets 565, hemoglobin 12.4, CMP normal, triglycerides 305, HDL 40, TSH normal November 03, 2015 P ANCA negative, c-ANCA negative, PR-3 negative, MPO negative May 07, 2017 hepatitis C antibody negative    Speciality Comments: No specialty comments available.  Procedures:  No procedures performed Allergies: Clindamycin/lincomycin, Lisinopril, Penicillins, and Sulfa antibiotics   Assessment / Plan:     Visit Diagnoses: Positive ANA (antinuclear antibody) - July 25, 2021 AVISE lupus index -1.9, ANA negative, ENA negative, Jo 1 negative, anticardiolipin negative, beta-2 GP 1 negative, antiphosphatidylserine negative, antihistone negative, anticarp negative, RF negative, anti-CCP negative, antithyroglobulin negative, anti-TPO negative.  She gives history of intermittent oral ulcers, dry mouth and dry eyes.  All autoimmune work-up was negative.  Left findings were discussed with patient at length.  There is no history of nasal ulcers, malar rash, Raynaud's phenomenon, photosensitivity or lymphadenopathy.    IPF (idiopathic pulmonary fibrosis) (HCC) - High-resolution CT showed UIP pattern with mild progression.  She  is on Ofev since 2017.  She is followed by Dr. Vaughan Browner.  Sicca syndrome [facility]) - Serology and the biopsy was negative in the past.  She has lacrimal plugs.  She had good motion in her eyes.  Her skin was dry.  Pain in thoracic spine - History of pain in thoracic spine and costochondritis for the last 5 years.  Symptoms have become more frequent.  I am uncertain about the etiology of the costochondritis.  Costochondritis - Frequent episodes requiring prednisone taper x2.  Patient cannot use NSAIDs due to gastroesophageal reflux.  Chest x-ray did not show any osseous abnormalities.  Other medical problems are listed as follows:  History of asthma  Essential hypertension  Coronary artery calcification  Hypertriglyceridemia  History of diabetes mellitus, type II  History of gastroesophageal reflux (GERD)  History of vitamin D deficiency - She is on vitamin D 2000 units daily.  Osteoporosis screening - DEXA scan was normal recently.  Acquired hypothyroidism  Anxiety and depression  Former smoker - 3 packs/day for 20 years.  She quit smoking in 1987.  Orders: No orders of the defined types were placed in this encounter.  No orders of the defined types were placed in this encounter.    Follow-Up Instructions: Return if symptoms worsen or fail to improve, for Osteoarthritis.   Bo Merino, MD  Note - This record has been created using Editor, commissioning.  Chart creation errors have been sought, but may not always  have been located. Such creation errors do not reflect on  the standard of medical care.

## 2021-08-13 ENCOUNTER — Encounter: Payer: Self-pay | Admitting: Pulmonary Disease

## 2021-08-13 ENCOUNTER — Other Ambulatory Visit: Payer: Self-pay

## 2021-08-13 ENCOUNTER — Ambulatory Visit (INDEPENDENT_AMBULATORY_CARE_PROVIDER_SITE_OTHER): Payer: Medicare Other | Admitting: Pulmonary Disease

## 2021-08-13 VITALS — BP 126/72 | HR 82 | Temp 98.2°F | Ht 66.0 in | Wt 156.0 lb

## 2021-08-13 DIAGNOSIS — I2584 Coronary atherosclerosis due to calcified coronary lesion: Secondary | ICD-10-CM

## 2021-08-13 DIAGNOSIS — R0789 Other chest pain: Secondary | ICD-10-CM

## 2021-08-13 DIAGNOSIS — Z5181 Encounter for therapeutic drug level monitoring: Secondary | ICD-10-CM

## 2021-08-13 DIAGNOSIS — R0602 Shortness of breath: Secondary | ICD-10-CM | POA: Diagnosis not present

## 2021-08-13 DIAGNOSIS — I2510 Atherosclerotic heart disease of native coronary artery without angina pectoris: Secondary | ICD-10-CM | POA: Diagnosis not present

## 2021-08-13 DIAGNOSIS — J84112 Idiopathic pulmonary fibrosis: Secondary | ICD-10-CM | POA: Diagnosis not present

## 2021-08-13 DIAGNOSIS — G4733 Obstructive sleep apnea (adult) (pediatric): Secondary | ICD-10-CM

## 2021-08-13 NOTE — Patient Instructions (Addendum)
We will get a CT angiogram for better evaluation of chest pain Schedule 6-minute walk test for oxygen assessment and overnight oximetry  Continue ofev I will make some calls regarding the high altitude testing to see if it can be performed  Follow-up in 3 months.

## 2021-08-13 NOTE — Progress Notes (Signed)
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  She is to follow with Dr. Pryor Ochoa at [facility] in [place] with CT scan showing UIP fibrosis.  She was started on Ofev 2020 however developed epistaxis requiring balloon.  Stop her Ofev and aspirin briefly, evaluated by ENT and initiated again with lower dose of Ofev at 100 twice daily around December 2020 which is then increased to 150 mg twice daily in April 2021 with no recurrence of epistaxis  History notable for carpal tunnel, trigger finger for which she had recent surgery on the right hand.  Evaluated 40 years ago for dry eyes and eye infection which is thought to be Sjogren's but negative on rheumatologic evaluation and ?  lip biopsy.  Complains of dyspnea on exertion which is chronic.  No cough, sputum production She has morning stiffness in the small joints of the hand and some difficulty swallowing.  Denies any Raynaud's symptoms, rash Also has daytime somnolence, fatigue in the morning, witnessed snoring and is requesting an evaluation for sleep apnea.  Pets: Has dogs, no birds Occupation: Retired Government social research officer for [employer] Exposures: Had mold in the old house but moved out of it around 2019.  No ongoing exposures.  No down pillows or comforters Smoking history: 60 pack year smoker, quit in 1987 Travel history: Originally from [place]. No recent travel Relevant family history: Father died of lung cancer.  He was a smoker.  Interim history: Continues on Ofev without any issue Evaluated by Dr. Estanislado Pandy, rheumatology in July 24, 2021 who ordered AVISE  labs  Complains of intermittent chest pain and tightness bilaterally with no precipitating cause.  She has an oxygen during and report shows brief intermittent desats to the mid 80s.  Outpatient Encounter Medications as of 08/13/2021  Medication Sig   albuterol (VENTOLIN HFA) 108 (90 Base) MCG/ACT inhaler Inhale 2 puffs into the lungs every 6 (six) hours as needed for wheezing or shortness of breath.   amLODipine (NORVASC) 10 MG tablet Take by mouth.   BREO ELLIPTA 200-25 MCG/INH AEPB USE 1 INHALATION ORALLY    DAILY   carvedilol (COREG) 12.5 MG tablet Take by mouth.   Cholecalciferol 50 MCG (2000 UT) CAPS Take by mouth.   ferrous sulfate 325 (65 FE) MG tablet Take by mouth.   fluticasone (CUTIVATE) 0.05 % cream SMARTSIG:Topical 1-2 Times Daily PRN   fluticasone furoate-vilanterol (BREO ELLIPTA) 200-25 MCG/ACT AEPB Inhale 1 puff into the lungs daily.   levothyroxine (SYNTHROID) 75 MCG tablet Take by mouth.   liraglutide (VICTOZA) 18 MG/3ML SOPN INJECT 0.6 MG DAILY FOR 1 WEEK THEN 1.2 MG DAILY FOR 1 WEEK, THEN 1.8 MG DAILY FROM THEN ON   Magnesium 400 MG CAPS    metFORMIN (GLUCOPHAGE) 500 MG tablet Take 1,000 mg by mouth daily with breakfast.   mometasone (ELOCON) 0.1 % ointment Apply sparingly to affected area 3 times weekly as directed.   montelukast (SINGULAIR) 10 MG tablet TAKE 1 TABLET AT BEDTIME   OFEV 150 MG CAPS TAKE 1 CAPSULE BY MOUTH TWICE DAILY WITH FOOD.   omeprazole (PRILOSEC) 40 MG capsule Take by mouth.   rosuvastatin (  CRESTOR) 20 MG tablet Take 1 tablet (20 mg total) by mouth daily.   sertraline (ZOLOFT) 50 MG tablet Take by mouth.   No facility-administered encounter medications on file as of 08/13/2021.   Physical Exam: Blood pressure 126/72, pulse 82, temperature 98.2 F (36.8 C), temperature source Oral, height 5\' 6"  (1.676 m), weight 156 lb (70.8 kg), SpO2 97 %. Gen:      No acute distress HEENT:  EOMI, sclera anicteric Neck:     No masses; no thyromegaly Lungs:     Bibasal crackles CV:         Regular rate and rhythm; no murmurs Abd:      + bowel sounds; soft, non-tender; no palpable masses, no distension Ext:    No edema; adequate peripheral perfusion Skin:      Warm and dry; no rash Neuro: alert and oriented x 3 Psych: normal mood and affect   Data Reviewed: Imaging: CT chest 06/04/2018-chronic interstitial changes with septal thickening, volume loss and traction bronchiolectasis.    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  High-resolution CT 04/17/2021-mild pulmonary fibrosis and probable UIP pattern with progression compared to prior. I have reviewed the images personally.  PFTs: 02/11/19 FVC 2.24 L (75%), FEV1 1.71 L (74%), FEV1/FVC-77% TLC-3.45 (66%) predicted, DLCO-12.3 (58%) Moderate obstruction with diffusion defect  11/12/2019 FVC 2.24 [68%], FEV1 1.89 [76%], F/F 85, TLC 3.67 [69%], DLCO 14.56 [69%] Moderate obstruction, diffusion defect  10/03/2020 FVC 2.19 [61%], FEV1 1.85 [95%], F/F 84, TLC 3.57 [5%], DLCO 14.17 [69%] Moderate obstruction, diffusion defect  6-minute walk test 04/26/2019- 81 m  Labs: CTD serologies 11/01/2019 significant for ANA 1:40 nucleolar speckled  Sleep: Home study 11/17/2019-mild sleep apnea, AHI 10.8.  O2 desaturations to 83%.  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 Continue Ofev.  We considered further work-up with biopsy but have deferred after discussion with patient Serologies notable for borderline elevation in ANA.  This is likely nonspecific.  She has seen Dr. Estanislado Pandy from rheumatology with no evidence of autoimmune process  PFTs show slight decrease in lung capacity. CT scan reviewed with mild progression which is expected for the disease.  We will continue current therapy without change Recent comprehensive metabolic panel is normal  Her most recent complaint is intermittent chest pain and she notes  some desats on her oxygen at home.  We will get a CT angiogram for better evaluation and schedule 6-minute walk test and overnight oximetry for oxygen evaluation  She would also like a referral for high-altitude simulation testing since she is planning to fly on vacation in the near future.  Mild sleep apnea Continue AutoSet CPAP.  Download reviewed with good compliance  Plan/Recommendations: Continue Ofev, CPAP Check 6-minute walk test and overnight oximetry Referral for HAST testing  Marshell Garfinkel MD [facility] 08/13/2021, 3:39 PM  CC: Kirstie Peri Cromar*

## 2021-08-15 ENCOUNTER — Telehealth: Payer: Self-pay | Admitting: Pulmonary Disease

## 2021-08-15 ENCOUNTER — Other Ambulatory Visit: Payer: Self-pay

## 2021-08-15 ENCOUNTER — Ambulatory Visit: Payer: Medicare Other

## 2021-08-15 DIAGNOSIS — J84112 Idiopathic pulmonary fibrosis: Secondary | ICD-10-CM

## 2021-08-15 NOTE — Progress Notes (Signed)
Six Minute Walk - 08/15/21 1551       Six Minute Walk   Medications taken before test (dose and time) Amlodipine, Levothyroxine, Liraglutide, Nintedanib, Rosuvastatin, Sertraline all taken between 11:30 and 12    Supplemental oxygen during test? No    Lap distance in meters  34 meters    Laps Completed 11    Partial lap (in meters) 1 meters    Baseline BP (sitting) 140/80    Baseline Heartrate 78    Baseline Dyspnea (Borg Scale) 2    Baseline Fatigue (Borg Scale) 3    Baseline SPO2 97 %      End of Test Values    BP (sitting) 142/80    Heartrate 86    Dyspnea (Borg Scale) 4    Fatigue (Borg Scale) 5    SPO2 96 %      2 Minutes Post Walk Values   BP (sitting) 140/80    Heartrate 78    SPO2 100 %    Stopped or paused before six minutes? No    Other Symptoms at end of exercise: Hip pain      Interpretation   Distance completed 375 meters    Tech Comments: Patient walked average pace and completed 6 min walk without stopping. Expressed some shortness of breath and hip pain at the end.

## 2021-08-15 NOTE — Telephone Encounter (Signed)
DME will be the ones to reach out to her for them to be able to get her the device needed to do the ONO. Looks like order was sent to [employer].   Attempted to call pt but unable to reach. Left message for her to return call.

## 2021-08-16 ENCOUNTER — Telehealth: Payer: Self-pay | Admitting: Pulmonary Disease

## 2021-08-16 DIAGNOSIS — Z5181 Encounter for therapeutic drug level monitoring: Secondary | ICD-10-CM

## 2021-08-16 NOTE — Telephone Encounter (Signed)
Called and spoke to pt. Pt is questioning when she will be scheduled for her ONO. Per pt's chart the order was sent to [facility] and received confirmation on 1/10, informed pt of this. Pt states she has a CPAP through [employer] and she will call to get her test scheduled. Pt is aware to contact our office if there are any issues. Nothing further needed at this time.

## 2021-08-16 NOTE — Telephone Encounter (Signed)
I have placed the order stat for her.

## 2021-08-20 ENCOUNTER — Ambulatory Visit: Payer: Medicare Other | Admitting: Cardiology

## 2021-08-20 ENCOUNTER — Other Ambulatory Visit: Payer: Self-pay

## 2021-08-20 ENCOUNTER — Encounter: Payer: Self-pay | Admitting: Cardiology

## 2021-08-20 VITALS — BP 132/81 | HR 83 | Temp 98.0°F | Resp 17 | Ht 66.0 in | Wt 153.0 lb

## 2021-08-20 DIAGNOSIS — I2510 Atherosclerotic heart disease of native coronary artery without angina pectoris: Secondary | ICD-10-CM

## 2021-08-20 DIAGNOSIS — E781 Pure hyperglyceridemia: Secondary | ICD-10-CM

## 2021-08-20 NOTE — Progress Notes (Signed)
Patient referred by Rodney Langton* for coronary artery calcification  Subjective:   Kathryn Scott, female    DOB: Nov 27, 1945, 76 y.o.   MRN: [id number]   Chief Complaint  Patient presents with   Coronary Artery Disease   Hyperlipidemia   Follow-up    3 month    HPI  76 y.o. Caucasian female with hypertension, controlled type II DM, family history of CAD, asthma, hyperlipidemia, allergies, IPF, referred for evaluation of coronary artery disease  Patient has been having episodes of lower chest wall pain, that is worse with fever spikes. Recently, she was found to have elevated ESR and CRP. She is going to undergo CT chest w/contrast through her rheumatologist Dr. Estanislado Pandy as part of workup for possible myositis. Reviewed recent lipid panel with the patient, details below.    Current Outpatient Medications on File Prior to Visit  Medication Sig Dispense Refill   albuterol (VENTOLIN HFA) 108 (90 Base) MCG/ACT inhaler Inhale 2 puffs into the lungs every 6 (six) hours as needed for wheezing or shortness of breath. 8 g 6   amLODipine (NORVASC) 10 MG tablet Take by mouth.     BREO ELLIPTA 200-25 MCG/INH AEPB USE 1 INHALATION ORALLY    DAILY 60 each 0   carvedilol (COREG) 12.5 MG tablet Take by mouth.     Cholecalciferol 50 MCG (2000 UT) CAPS Take by mouth.     ferrous sulfate 325 (65 FE) MG tablet Take by mouth.     fluticasone (CUTIVATE) 0.05 % cream SMARTSIG:Topical 1-2 Times Daily PRN     fluticasone furoate-vilanterol (BREO ELLIPTA) 200-25 MCG/ACT AEPB Inhale 1 puff into the lungs daily. 90 each 2   levothyroxine (SYNTHROID) 75 MCG tablet Take by mouth.     liraglutide (VICTOZA) 18 MG/3ML SOPN INJECT 0.6 MG DAILY FOR 1 WEEK THEN 1.2 MG DAILY FOR 1 WEEK, THEN 1.8 MG DAILY FROM THEN ON     Magnesium 400 MG CAPS      metFORMIN (GLUCOPHAGE) 500 MG tablet Take 1,000 mg by mouth daily with breakfast.     mometasone (ELOCON) 0.1 % ointment Apply sparingly to affected area 3  times weekly as directed.     montelukast (SINGULAIR) 10 MG tablet TAKE 1 TABLET AT BEDTIME 90 tablet 1   OFEV 150 MG CAPS TAKE 1 CAPSULE BY MOUTH TWICE DAILY WITH FOOD. 60 capsule 5   omeprazole (PRILOSEC) 40 MG capsule Take by mouth.     rosuvastatin (CRESTOR) 20 MG tablet Take 1 tablet (20 mg total) by mouth daily. 90 tablet 3   sertraline (ZOLOFT) 50 MG tablet Take by mouth.     No current facility-administered medications on file prior to visit.    Cardiovascular and other pertinent studies:  EKG 06/01/2021: Sinus rhythm 79 bpm Baseline artifact Otherwise normal EKG  CT Chest 04/17/2021: 1. Redemonstrated mild pulmonary fibrosis in a pattern with apical to basal gradient, featuring irregular peripheral interstitial opacity, traction bronchiectasis, and areas of subpleural bronchiolectasis without clear evidence of honeycombing. Fibrotic findings are slightly worsened compared to prior examination. Findings remain categorized as probable UIP per consensus guidelines: Diagnosis of Idiopathic Pulmonary Fibrosis: An Official ATS/ERS/JRS/ALAT Clinical Practice Guideline. [place], Iss 5, [id number], Apr 05 2017. 2. Coronary artery disease.   Aortic Atherosclerosis (ICD10-I70.0).   Lexiscan nuclear stress test 11/2017: 1. This is a normal myocardial perfusion study with no evidence of reversible ischemia or infarction. This is a low risk study.  2. There is no prior study available for comparison  3. Based on gated SPECT, LEFT ventricular ejection fraction is  74%  4. There is no transient ischemic dilation.    Recent labs: 07/19/2021: Glucose 109, BUN/Cr 21/0.85. EGFR 72. Na/K 138/4.9. Rest of the CMP normal H/H 12/37. MCV 89. Platelets 565 HbA1C 6.3% Chol 118, TG 305, HDL 40, LDL 33 TSH 3.6 normal ESR 83, CRP 20  High  05/08/2021: Glucose 103, BUN/Cr 27/1.09. EGFR 50. Na/K 136/4.1. Rest of the CMP normal  01/16/2021: Glucose 104, BUN/Cr 21/0.86.  EGFR 72. Na/K 139/5.1. Rest of the CMP normal H/H 14/42. MCV 91. Platelets 379 HbA1C 5.7% Chol 175, TG 427, HDL 46, LDL 63 TSH 3.6 normal   10/2019: H/H 12/38. MCV 86. Platelets 365   Review of Systems  Cardiovascular:  Positive for dyspnea on exertion (Chronic, stable). Negative for chest pain, leg swelling, palpitations and syncope.        Vitals:   08/20/21 1306  BP: 132/81  Pulse: 83  Resp: 17  Temp: 98 F (36.7 C)  SpO2: 97%     Body mass index is 24.69 kg/m. Filed Weights   08/20/21 1306  Weight: 153 lb (69.4 kg)     Objective:   Physical Exam Vitals and nursing note reviewed.  Constitutional:      General: She is not in acute distress. Neck:     Vascular: No JVD.  Cardiovascular:     Rate and Rhythm: Normal rate and regular rhythm.     Heart sounds: Normal heart sounds. No murmur heard. Pulmonary:     Effort: Pulmonary effort is normal.     Breath sounds: Normal breath sounds. No wheezing or rales.  Musculoskeletal:     Right lower leg: No edema.     Left lower leg: No edema.        Assessment & Recommendations:    75 y.o. Caucasian female with hypertension, controlled type II DM, family history of CAD, asthma, hyperlipidemia, allergies, IPF, referred for evaluation of coronary artery disease  Coronary calcification: No anginal symptoms.  Stable dyspnea, likely pulmonary fibrosis.  Normal Lexiscan nuclear stress test in 2019. No specific exertional chest pain at this time. Reasonable to forego any ischemia testing at this time in absence of symptoms. Continue medical management Her biggest modifiable risk factor that is not controlled at this time, is high triglycerides.  We could consider Vascepa in addition to lovastatin, patient wants to avoid it given her prior history of epistaxis.   Currently on rosuvastatin 20 mg daily, TG down from 427 to 305. Counseled patient on low-fat and low simple carbohydrate diet.   Ongoing workup for myositis.  WiIll hold off any change at this time. Repeat fasting lipid panel in 3 months.  Continue excellent control of hypertension, and diabetes as you are doing.  F/u in 3 months   Nigel Mormon, MD Pager: [phone] Office: [phone]

## 2021-08-22 ENCOUNTER — Other Ambulatory Visit: Payer: Self-pay

## 2021-08-22 ENCOUNTER — Encounter: Payer: Self-pay | Admitting: Rheumatology

## 2021-08-22 ENCOUNTER — Ambulatory Visit (INDEPENDENT_AMBULATORY_CARE_PROVIDER_SITE_OTHER): Payer: Medicare Other | Admitting: Rheumatology

## 2021-08-22 ENCOUNTER — Other Ambulatory Visit (INDEPENDENT_AMBULATORY_CARE_PROVIDER_SITE_OTHER): Payer: Medicare Other

## 2021-08-22 VITALS — BP 153/87 | HR 79 | Resp 12 | Ht 66.0 in | Wt 158.2 lb

## 2021-08-22 DIAGNOSIS — I2584 Coronary atherosclerosis due to calcified coronary lesion: Secondary | ICD-10-CM

## 2021-08-22 DIAGNOSIS — Z8719 Personal history of other diseases of the digestive system: Secondary | ICD-10-CM

## 2021-08-22 DIAGNOSIS — F32A Depression, unspecified: Secondary | ICD-10-CM

## 2021-08-22 DIAGNOSIS — R7689 Other specified abnormal immunological findings in serum: Secondary | ICD-10-CM

## 2021-08-22 DIAGNOSIS — Z5181 Encounter for therapeutic drug level monitoring: Secondary | ICD-10-CM

## 2021-08-22 DIAGNOSIS — Z13820 Encounter for screening for osteoporosis: Secondary | ICD-10-CM

## 2021-08-22 DIAGNOSIS — Z8709 Personal history of other diseases of the respiratory system: Secondary | ICD-10-CM

## 2021-08-22 DIAGNOSIS — I2510 Atherosclerotic heart disease of native coronary artery without angina pectoris: Secondary | ICD-10-CM

## 2021-08-22 DIAGNOSIS — M546 Pain in thoracic spine: Secondary | ICD-10-CM | POA: Diagnosis not present

## 2021-08-22 DIAGNOSIS — I10 Essential (primary) hypertension: Secondary | ICD-10-CM

## 2021-08-22 DIAGNOSIS — J84112 Idiopathic pulmonary fibrosis: Secondary | ICD-10-CM

## 2021-08-22 DIAGNOSIS — R768 Other specified abnormal immunological findings in serum: Secondary | ICD-10-CM

## 2021-08-22 DIAGNOSIS — E781 Pure hyperglyceridemia: Secondary | ICD-10-CM

## 2021-08-22 DIAGNOSIS — M3500 Sicca syndrome, unspecified: Secondary | ICD-10-CM

## 2021-08-22 DIAGNOSIS — E039 Hypothyroidism, unspecified: Secondary | ICD-10-CM

## 2021-08-22 DIAGNOSIS — Z8639 Personal history of other endocrine, nutritional and metabolic disease: Secondary | ICD-10-CM

## 2021-08-22 DIAGNOSIS — M940 Chondrocostal junction syndrome [Tietze]: Secondary | ICD-10-CM

## 2021-08-22 DIAGNOSIS — F419 Anxiety disorder, unspecified: Secondary | ICD-10-CM

## 2021-08-22 DIAGNOSIS — Z87891 Personal history of nicotine dependence: Secondary | ICD-10-CM

## 2021-08-22 LAB — BASIC METABOLIC PANEL
BUN: 24 mg/dL — ABNORMAL HIGH (ref 6–23)
CO2: 29 mEq/L (ref 19–32)
Calcium: 9.4 mg/dL (ref 8.4–10.5)
Chloride: 100 mEq/L (ref 96–112)
Creatinine, Ser: 1.24 mg/dL — ABNORMAL HIGH (ref 0.40–1.20)
GFR: 43.27 mL/min — ABNORMAL LOW (ref >60.00)
Glucose, Bld: 94 mg/dL (ref 70–99)
Potassium: 4.1 mEq/L (ref 3.5–5.1)
Sodium: 137 mEq/L (ref 135–145)

## 2021-08-23 ENCOUNTER — Ambulatory Visit (HOSPITAL_COMMUNITY): Payer: Medicare Other

## 2021-08-23 ENCOUNTER — Ambulatory Visit (HOSPITAL_COMMUNITY)
Admission: RE | Admit: 2021-08-23 | Discharge: 2021-08-23 | Disposition: A | Discharge status: Home / Self Care | Payer: Medicare Other | Source: Ambulatory Visit | Attending: Pulmonary Disease | Admitting: Pulmonary Disease

## 2021-08-23 ENCOUNTER — Telehealth: Payer: Self-pay | Admitting: Pulmonary Disease

## 2021-08-23 ENCOUNTER — Encounter (HOSPITAL_COMMUNITY): Payer: Self-pay

## 2021-08-23 ENCOUNTER — Other Ambulatory Visit: Payer: Self-pay | Admitting: Pulmonary Disease

## 2021-08-23 DIAGNOSIS — R079 Chest pain, unspecified: Secondary | ICD-10-CM

## 2021-08-23 MED ORDER — TECHNETIUM TO 99M ALBUMIN AGGREGATED
4.2000 | Freq: Once | INTRAVENOUS | Status: AC | PRN
  Administered 2021-08-23: 4.2 via INTRAVENOUS

## 2021-08-23 NOTE — Telephone Encounter (Signed)
Per lab results note:  Cr is higher. Lets cancel the CTA and order a VQ scan instead.  I called and spoke with the pt and notified of results/recs per Dr Vaughan Browner. She was agreeable to VQ scan and I have ordered this STAT. CTA was cancelled.   She states haas not heard from [facility] regarding high altitude stimulation test. Dr Vaughan Browner had been in contact with them. I made him aware that they have yet to call the pt. He states he will reach out to them again. Pt aware. Nothing further needed at this time.

## 2021-09-14 ENCOUNTER — Telehealth: Payer: Self-pay | Admitting: Pulmonary Disease

## 2021-09-14 ENCOUNTER — Encounter: Payer: Self-pay | Admitting: Pulmonary Disease

## 2021-09-14 NOTE — Telephone Encounter (Signed)
Faxed PFT results from 10/13/20 which is most recent PFT via Epic. Nothing further needed at this point.

## 2021-09-15 ENCOUNTER — Encounter: Payer: Self-pay | Admitting: Pulmonary Disease

## 2021-09-16 ENCOUNTER — Encounter: Payer: Self-pay | Admitting: Pulmonary Disease

## 2021-10-15 ENCOUNTER — Other Ambulatory Visit: Payer: Self-pay | Admitting: Pulmonary Disease

## 2021-10-30 ENCOUNTER — Other Ambulatory Visit: Payer: Self-pay | Admitting: Pulmonary Disease

## 2021-10-30 DIAGNOSIS — J84112 Idiopathic pulmonary fibrosis: Secondary | ICD-10-CM

## 2021-10-30 NOTE — Telephone Encounter (Signed)
Patient has OV on 11/05/21. Will send Ofev refill after OV ? ?Knox Saliva, PharmD, MPH, BCPS ?Clinical Pharmacist (Rheumatology and Pulmonology) ?

## 2021-11-01 LAB — LIPID PANEL
Chol/HDL Ratio: 3 ratio (ref 0.0–4.4)
Cholesterol, Total: 121 mg/dL (ref 100–199)
HDL: 40 mg/dL (ref >39)
LDL Chol Calc (NIH): 40 mg/dL (ref 0–99)
Triglycerides: 272 mg/dL — ABNORMAL HIGH (ref 0–149)
VLDL Cholesterol Cal: 41 mg/dL — ABNORMAL HIGH (ref 5–40)

## 2021-11-05 ENCOUNTER — Ambulatory Visit (INDEPENDENT_AMBULATORY_CARE_PROVIDER_SITE_OTHER): Payer: Medicare Other | Admitting: Pulmonary Disease

## 2021-11-05 ENCOUNTER — Encounter: Payer: Self-pay | Admitting: Pulmonary Disease

## 2021-11-05 VITALS — BP 142/58 | HR 85 | Temp 98.4°F | Ht 66.0 in | Wt 152.2 lb

## 2021-11-05 DIAGNOSIS — R0602 Shortness of breath: Secondary | ICD-10-CM | POA: Diagnosis not present

## 2021-11-05 DIAGNOSIS — I2510 Atherosclerotic heart disease of native coronary artery without angina pectoris: Secondary | ICD-10-CM

## 2021-11-05 DIAGNOSIS — J84112 Idiopathic pulmonary fibrosis: Secondary | ICD-10-CM | POA: Diagnosis not present

## 2021-11-05 DIAGNOSIS — Z5181 Encounter for therapeutic drug level monitoring: Secondary | ICD-10-CM | POA: Diagnosis not present

## 2021-11-05 DIAGNOSIS — I2584 Coronary atherosclerosis due to calcified coronary lesion: Secondary | ICD-10-CM

## 2021-11-05 LAB — COMPREHENSIVE METABOLIC PANEL
ALT: 14 U/L (ref 0–35)
AST: 18 U/L (ref 0–37)
Albumin: 3.7 g/dL (ref 3.5–5.2)
Alkaline Phosphatase: 75 U/L (ref 39–117)
BUN: 18 mg/dL (ref 6–23)
CO2: 27 mEq/L (ref 19–32)
Calcium: 9.5 mg/dL (ref 8.4–10.5)
Chloride: 100 mEq/L (ref 96–112)
Creatinine, Ser: 0.86 mg/dL (ref 0.40–1.20)
GFR: 67.03 mL/min (ref >60.00)
Glucose, Bld: 89 mg/dL (ref 70–99)
Potassium: 4.3 mEq/L (ref 3.5–5.1)
Sodium: 136 mEq/L (ref 135–145)
Total Bilirubin: 0.3 mg/dL (ref 0.2–1.2)
Total Protein: 7.6 g/dL (ref 6.0–8.3)

## 2021-11-05 NOTE — Progress Notes (Signed)
? ?      ?Kathryn Scott    [id number]    1946/01/23 ? ?Primary Care Physician:Burgess, Roxana Hires, MD ? ?Referring Physician: Casey Burkitt, MD ?[address] ?[address] ?[place],  [place] [address] ? 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 ?HPI: ?Past medical history of hypertension, asthma, hyperlipidemia, allergies, IPF ? ?Diagnosed with IPF in 2020.  She is to follow with Dr. Leonor Liv at [facility] in [place] with CT scan showing UIP fibrosis.  She was started on Ofev 2020 however developed epistaxis requiring balloon.  Stop her Ofev and aspirin briefly, evaluated by ENT and initiated again with lower dose of Ofev at 100 twice daily around December 2020 which is then increased to 150 mg twice daily in April 2021 with no recurrence of epistaxis ? ?History notable for carpal tunnel, trigger finger for which she had recent surgery on the right hand.  Evaluated 40 years ago for dry eyes and eye infection which is thought to be Sjogren's but negative on rheumatologic evaluation and ?  lip biopsy. ? ?Complains of dyspnea on exertion which is chronic.  No cough, sputum production ?She has morning stiffness in the small joints of the hand and some difficulty swallowing.  Denies any Raynaud's symptoms, rash ?Also has daytime somnolence, fatigue in the morning, witnessed snoring and is requesting an evaluation for sleep apnea. ?Evaluated by Dr. Corliss Skains, rheumatology in July 24, 2021 who ordered AVISE labs ? ?Pets: Has dogs, no birds ?Occupation: Retired Emergency planning/management officer for [employer] ?Exposures: Had mold in the old house but moved out of it around 2019.  No ongoing exposures.  No down pillows or comforters ?Smoking history: 60 pack year smoker, quit in 1987 ?Travel history: Originally from [place]. No recent travel ?Relevant family history: Father died of lung cancer.  He was a smoker. ? ?Interim history: ?Continues on Ofev without any  issue ?Underwent high altitude testing at [facility] and was told that she needs supplemental oxygen on exertion at high altitude.  She is planning a road trip with her husband over the mountains of [place] in the future ? ?She had a VQ scan which is negative for pulmonary embolism ? ?Overnight oximetry performed in February 2023.  We have not received the results yet. ? ? ?Outpatient Encounter Medications as of 11/05/2021  ?Medication Sig  ? albuterol (VENTOLIN HFA) 108 (90 Base) MCG/ACT inhaler Inhale 2 puffs into the lungs every 6 (six) hours as needed for wheezing or shortness of breath.  ? amLODipine (NORVASC) 10 MG tablet Take by mouth.  ? BREO ELLIPTA 200-25 MCG/INH AEPB USE 1 INHALATION ORALLY    DAILY  ? carvedilol (COREG) 12.5 MG tablet Take by mouth.  ? Cholecalciferol 50 MCG (2000 UT) CAPS Take by mouth.  ? ferrous sulfate 325 (65 FE) MG tablet Take by mouth.  ? fluticasone (CUTIVATE) 0.05 % cream SMARTSIG:Topical 1-2 Times Daily PRN  ? levothyroxine (SYNTHROID) 75 MCG tablet Take by mouth.  ? liraglutide (VICTOZA) 18 MG/3ML SOPN INJECT 0.6 MG DAILY FOR 1 WEEK THEN 1.2 MG DAILY FOR 1 WEEK, THEN 1.8 MG DAILY FROM THEN ON  ? Magnesium 400 MG CAPS   ? metFORMIN (GLUCOPHAGE) 500 MG tablet Take 1,000 mg by mouth daily with breakfast.  ? mometasone (ELOCON) 0.1 % ointment Apply sparingly to affected area 3 times weekly as directed.  ? montelukast (SINGULAIR) 10 MG tablet TAKE 1 TABLET AT BEDTIME  ? OFEV 150 MG CAPS  TAKE 1 CAPSULE BY MOUTH TWICE DAILY WITH FOOD.  ? omeprazole (PRILOSEC) 40 MG capsule Take by mouth.  ? sertraline (ZOLOFT) 50 MG tablet Take by mouth.  ? rosuvastatin (CRESTOR) 20 MG tablet Take 1 tablet (20 mg total) by mouth daily.  ? ?No facility-administered encounter medications on file as of 11/05/2021.  ? ?Physical Exam: ?Blood pressure (!) 142/58, pulse 85, temperature 98.4 ?F (36.9 ?C), temperature source Oral, height 5\' 6"  (1.676 m), weight 152 lb 3.2 oz (69 kg), SpO2 96 %. ?Gen:       No acute distress ?HEENT:  EOMI, sclera anicteric ?Neck:     No masses; no thyromegaly ?Lungs:   Bibasal crackles ?CV:         Regular rate and rhythm; no murmurs ?Abd:      + bowel sounds; soft, non-tender; no palpable masses, no distension ?Ext:    No edema; adequate peripheral perfusion ?Skin:      Warm and dry; no rash ?Neuro: alert and oriented x 3 ?Psych: normal mood and affect  ? ?Data Reviewed: ?Imaging: ?CT chest 06/04/2018-chronic interstitial changes with septal thickening, volume loss and traction bronchiolectasis.   ? ?High-resolution CT 11/01/2019-pulmonary fibrosis in probable UIP pattern. ? ?High-resolution CT 04/17/2021-mild pulmonary fibrosis and probable UIP pattern with progression compared to prior. ? ?VQ scan 08/23/21-negative for pulmonary embolism ?I have reviewed the images personally. ? ?PFTs: ?02/11/19 ?FVC 2.24 L (75%), FEV1 1.71 L (74%), FEV1/FVC-77% TLC-3.45 (66%) predicted, DLCO-12.3 (58%) ?Moderate obstruction with diffusion defect ? ?11/12/2019 ?FVC 2.24 [68%], FEV1 1.89 [76%], F/F 85, TLC 3.67 [69%], DLCO 14.56 [69%] ?Moderate obstruction, diffusion defect ? ?10/03/2020 ?FVC 2.19 [61%], FEV1 1.85 [95%], F/F 84, TLC 3.57 [5%], DLCO 14.17 [69%] ?Moderate obstruction, diffusion defect ? ?6-minute walk test 04/26/2019- 1365 m ? ?Labs: ?CTD serologies 11/01/2019 significant for ANA 1:40 nucleolar speckled ? ?Sleep: ?Home study 11/17/2019-mild sleep apnea, AHI 10.8.  O2 desaturations to 83%. ? ?Assessment:  ?Pulmonary fibrosis ?High-res CT reviewed with probable UIP pattern pulmonary fibrosis. ?Continue Ofev.  We considered further work-up with biopsy but have deferred after discussion with patient ?Serologies notable for borderline elevation in ANA.  This is likely nonspecific.  ?She has seen Dr. 11/19/2019 from rheumatology with no evidence of autoimmune process ? ?PFTs show slight decrease in lung capacity. CT scan reviewed with mild progression which is expected for the disease.  We will continue  current therapy without change ?Check complete metabolic panel, proBNP for monitoring today. ? ?Get overnight oximetry results that was done in February ? ?High altitude stimulation test ?She underwent high-altitude simulation test at [facility] in March 2023.  Recommendations made for 2 L supplemental oxygen.  She plans to travel over mountains on the April 2023 this summer and will send a prescription for supplemental oxygen. ? ?Mild sleep apnea ?Continue AutoSet CPAP.  Download reviewed with good compliance ? ?Plan/Recommendations: ?Continue Ofev, CPAP ?Get overnight oximetry results ?Supplemental oxygen for travel to high altitude ?PFTs in July 2023. ? ?August 2023 MD ?[facility] ?11/05/2021, 2:56 PM ? ?CC: 01/05/2022* ? ? ?

## 2021-11-05 NOTE — Patient Instructions (Signed)
We get records of your overnight oximetry to see whether he had desaturations ?Continue CPAP at night ?Continue Ofev ?We will check comprehensive metabolic panel and proBNP for dyspnea today ?Schedule PFTs in July and follow-up in clinic after PFTs. ?

## 2021-11-06 LAB — PRO B NATRIURETIC PEPTIDE: NT-Pro BNP: 131 pg/mL (ref 0–301)

## 2021-11-06 NOTE — Telephone Encounter (Signed)
Refill sent for OFEV to [facility] (pulmonary fibrosis team): [phone] ? ?Dose: 150 mg twice daily ? ?Last OV: 11/06/21 ?Labs on 11/05/21 wnl ?Provider: Dr. Isaiah Serge ? ?Next OV: July 2023 (or after PFTs) ? ?Chesley Mires, PharmD, MPH, BCPS ?Clinical Pharmacist (Rheumatology and Pulmonology) ? ?

## 2021-11-06 NOTE — Addendum Note (Signed)
Addended by: Jacquiline Doe on: 11/06/2021 11:17 AM ? ? Modules accepted: Orders ? ?

## 2021-11-07 ENCOUNTER — Encounter: Payer: Self-pay | Admitting: Cardiology

## 2021-11-07 ENCOUNTER — Ambulatory Visit: Payer: Medicare Other | Admitting: Cardiology

## 2021-11-07 VITALS — BP 105/67 | HR 72 | Temp 97.6°F | Resp 16 | Ht 66.0 in | Wt 152.4 lb

## 2021-11-07 DIAGNOSIS — I2510 Atherosclerotic heart disease of native coronary artery without angina pectoris: Secondary | ICD-10-CM

## 2021-11-07 DIAGNOSIS — E781 Pure hyperglyceridemia: Secondary | ICD-10-CM

## 2021-11-07 MED ORDER — ROSUVASTATIN CALCIUM 40 MG PO TABS: 40.0000 mg | ORAL_TABLET | Freq: Every day | ORAL | 3 refills | Status: DC

## 2021-11-07 NOTE — Progress Notes (Signed)
? ? ?Patient referred by Rodney Langton* for coronary artery calcification ? ?Subjective:  ? ?Kathryn Scott, female    DOB: [birth year], 76 y.o.   MRN: [id number] ? ? ?Chief Complaint  ?Patient presents with  ? Coronary Artery Disease  ? Follow-up  ?  3 month  ? ? ?HPI ? ?76 y.o. Caucasian female with hypertension, controlled type II DM, family history of CAD, asthma, hyperlipidemia, allergies, IPF, coronary calcification ? ?Patient is doing well. Rheumatological workup was unremarkable. Reviewed recent lipid panel results with the patient, details below.  ? ? ?Current Outpatient Medications on File Prior to Visit  ?Medication Sig Dispense Refill  ? albuterol (VENTOLIN HFA) 108 (90 Base) MCG/ACT inhaler Inhale 2 puffs into the lungs every 6 (six) hours as needed for wheezing or shortness of breath. 8 g 6  ? amLODipine (NORVASC) 10 MG tablet Take by mouth.    ? BREO ELLIPTA 200-25 MCG/INH AEPB USE 1 INHALATION ORALLY    DAILY 60 each 0  ? carvedilol (COREG) 12.5 MG tablet Take by mouth.    ? Cholecalciferol 50 MCG (2000 UT) CAPS Take by mouth.    ? ferrous sulfate 325 (65 FE) MG tablet Take by mouth.    ? fluticasone (CUTIVATE) 0.05 % cream SMARTSIG:Topical 1-2 Times Daily PRN    ? levothyroxine (SYNTHROID) 75 MCG tablet Take by mouth.    ? liraglutide (VICTOZA) 18 MG/3ML SOPN INJECT 0.6 MG DAILY FOR 1 WEEK THEN 1.2 MG DAILY FOR 1 WEEK, THEN 1.8 MG DAILY FROM THEN ON    ? Magnesium 400 MG CAPS     ? metFORMIN (GLUCOPHAGE) 500 MG tablet Take 1,000 mg by mouth daily with breakfast.    ? mometasone (ELOCON) 0.1 % ointment Apply sparingly to affected area 3 times weekly as directed.    ? montelukast (SINGULAIR) 10 MG tablet TAKE 1 TABLET AT BEDTIME 90 tablet 1  ? OFEV 150 MG CAPS TAKE 1 CAPSULE BY MOUTH TWICE DAILY WITH FOOD. 60 capsule 5  ? omeprazole (PRILOSEC) 40 MG capsule Take by mouth.    ? rosuvastatin (CRESTOR) 20 MG tablet Take 1 tablet (20 mg total) by mouth daily. 90 tablet 3  ? sertraline (ZOLOFT) 50  MG tablet Take by mouth.    ? ?No current facility-administered medications on file prior to visit.  ? ? ?Cardiovascular and other pertinent studies: ? ?EKG 11/07/2021: ?Sinus rhythm 71 bpm ?Occasional PAC    ? ? ?CT Chest 04/17/2021: ?1. Redemonstrated mild pulmonary fibrosis in a pattern with apical ?to basal gradient, featuring irregular peripheral interstitial ?opacity, traction bronchiectasis, and areas of subpleural ?bronchiolectasis without clear evidence of honeycombing. Fibrotic ?findings are slightly worsened compared to prior examination. 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 [place] ?Med Vol 198, Iss 5, [id number], Apr 05 2017. ?2. Coronary artery disease. ?  ?Aortic Atherosclerosis (ICD10-I70.0). ?  ?Lexiscan nuclear stress test 11/2017: ?1. This is a normal myocardial perfusion study with no evidence of reversible ischemia or infarction. This is a low risk study.  ?2. There is no prior study available for comparison  ?3. Based on gated SPECT, LEFT ventricular ejection fraction is  74%  ?4. There is no transient ischemic dilation.  ? ? ?Recent labs: ?March-April 2023: ?Glucose 89, BUN/Cr 18/0.86. EGFR 67. Na/K 136/4.3. Rest of the CMP normal ?Chol 121, TG 272, HDL 40, LDL 40 ? ?07/19/2021: ?Glucose 109, BUN/Cr  21/0.85. EGFR 72. Na/K 138/4.9. Rest of the CMP normal ?H/H 12/37. MCV 89. Platelets 565 ?HbA1C 6.3% ?Chol 118, TG 305, HDL 40, LDL 33 ?TSH 3.6 normal ?ESR 83, CRP 20  High ? ?05/08/2021: ?Glucose 103, BUN/Cr 27/1.09. EGFR 50. Na/K 136/4.1. Rest of the CMP normal ? ?01/16/2021: ?Glucose 104, BUN/Cr 21/0.86. EGFR 72. Na/K 139/5.1. Rest of the CMP normal ?H/H 14/42. MCV 91. Platelets 379 ?HbA1C 5.7% ?Chol 175, TG 427, HDL 46, LDL 63 ?TSH 3.6 normal ? ? ?10/2019: ?H/H 12/38. MCV 86. Platelets 365 ? ? ?Review of Systems  ?Cardiovascular:  Positive for dyspnea on  exertion (Chronic, stable). Negative for chest pain, leg swelling, palpitations and syncope.  ? ?   ? ? ?Vitals:  ? 11/07/21 1208  ?BP: 105/67  ?Pulse: 72  ?Resp: 16  ?Temp: 97.6 ?F (36.4 ?C)  ?SpO2: 96%  ? ? ? ?Objective:  ? Physical Exam ?Vitals and nursing note reviewed.  ?Constitutional:   ?   General: She is not in acute distress. ?Neck:  ?   Vascular: No JVD.  ?Cardiovascular:  ?   Rate and Rhythm: Normal rate and regular rhythm.  ?   Heart sounds: Normal heart sounds. No murmur heard. ?Pulmonary:  ?   Effort: Pulmonary effort is normal.  ?   Breath sounds: Normal breath sounds. No wheezing or rales.  ?Musculoskeletal:  ?   Right lower leg: No edema.  ?   Left lower leg: No edema.  ? ?  ICD-10-CM   ?1. Coronary artery calcification  I25.10 EKG 12-Lead  ? I25.84 rosuvastatin (CRESTOR) 40 MG tablet  ?  ?2. Hypertriglyceridemia  E78.1 Lipid panel  ?  Lipid panel  ?  ? ? ?   ? ?Assessment & Recommendations:  ? ? ?76 y.o. Caucasian female with hypertension, controlled type II DM, family history of CAD, asthma, hyperlipidemia, allergies, IPF, referred for evaluation of coronary artery disease ? ?Coronary calcification: ?No anginal symptoms.  Stable dyspnea, likely pulmonary fibrosis.  Normal Lexiscan nuclear stress test in 2019. ?No specific exertional chest pain at this time. Reasonable to forego any ischemia testing at this time in absence of symptoms. ?Continue medical management ? ?TG remains elevated but improved.  ?She had epistaxis with Vascepa.  ?Adding fibrate to statin unlikely to have added benefit. ?Increase Crestor to 40 mg. Repeat lipid panel in 3 months. ? ?F/u in 3 months ? ? ?Nigel Mormon, MD ?Pager: [phone] ?Office: [phone] ? ?

## 2021-11-09 ENCOUNTER — Telehealth: Payer: Self-pay | Admitting: Pulmonary Disease

## 2021-11-09 NOTE — Telephone Encounter (Signed)
Kathryn Scott from [facility] call in refrence to the patient regarding moving forward with oxygen therapy. If the patient will receive service [facility] will need testings walk, Chart notes, and order form. Sherron Monday can be reached at [phone]  ?

## 2021-11-09 NOTE — Telephone Encounter (Signed)
Called patient and left voicemail to call office back. We need to get her scheduled for a 6 min walk per [facility].  ?

## 2021-11-12 NOTE — Telephone Encounter (Signed)
Called and spoke with patient. Patient states that she will be flying out in June and needs oxygen to fly out. Patient wants to know if she needs to do another since she had already did one in January.  ? ?I tried to call [facility] for them to fax over ONO results. They did not answer, will try to call back. ? ?PM, please advise. ? ? ? ?

## 2021-11-13 NOTE — Telephone Encounter (Signed)
Called [facility] and spoke with Misty Stanley to see when pt did the ONO last and she stated it was done back February 2023. Asked lisa to refax the results to the office and she stated she would. Routing to [place] for her to be on the lookout for. ?

## 2021-11-14 NOTE — Telephone Encounter (Signed)
ONO results received via fax from [facility]. ?

## 2021-11-16 NOTE — Telephone Encounter (Signed)
Pt calling back and would like ONO results- had high altitude simulation test at [facility]- states the results showed she needs to have oxygen at altitude. Pt states [facility] said they received order but need OV notes. Please advise [phone] ?

## 2021-11-16 NOTE — Telephone Encounter (Signed)
ATC patient to review Judson Roch, NP recommendations.  LM (DPR) on VM and advised I would call patient Monday to follow up. ?

## 2021-11-16 NOTE — Telephone Encounter (Deleted)
Pt calling back and would like ONO results- had high altitude simulation test at [facility]- states the results showed she needs to have oxygen at altitude. Apparently wanted to have POC sent to [facility] and not [facility]. Pt states [facility] said they received order but need OV notes. Please advise [phone] ?

## 2021-11-16 NOTE — Telephone Encounter (Signed)
POC order was placed 11/06/21 for 2L [place] POC. [facility] has order and LOV note. Will have ONO results reviewed. ?

## 2021-11-16 NOTE — Telephone Encounter (Signed)
ONO results reviewed by Judson Roch, NP.  ?ONO was completed on 2L with cpap. ?Sarah, NP  advised patient increase O2 to 3L at night and have Dr. Vaughan Browner review next week when he returns to office. ?I called patient and left detailed message on patient VM (DPR) to increase O2. ?

## 2021-11-19 ENCOUNTER — Telehealth: Payer: Self-pay | Admitting: Pulmonary Disease

## 2021-11-19 NOTE — Telephone Encounter (Signed)
Called and spoke with patient.  Patient stated she received Sarah,NP's recommendations with ONO results. ?Patient completed 2 ONO's.  1 ONO is on room air 09/06/21 and 1 ONO 09/15/21 was  on cpap only. Patient is not on additional O2 at night.  ?ONO results received for 09/15/21 states patient used cpap with 2LPM.  ?Patient altitude test showed patient needed 2L [place] while on plane.  Patient stated she only needs O2 when ambulating on plane, not continuously. POC order was placed for 2L POC and patient is concerned she may have to bring additional battery packs for airline policy with oxygen.  ?Patient is aware Dr. Isaiah Serge will be in office 11/21/21 and I will follow up with her after Dr. Isaiah Serge reviews her results.  ?

## 2021-11-19 NOTE — Telephone Encounter (Signed)
See other encounter.

## 2021-11-21 NOTE — Telephone Encounter (Signed)
Message routed to Dr. Mannam to advise °

## 2021-11-23 ENCOUNTER — Telehealth: Payer: Self-pay | Admitting: Pulmonary Disease

## 2021-11-23 DIAGNOSIS — J84112 Idiopathic pulmonary fibrosis: Secondary | ICD-10-CM

## 2021-11-23 NOTE — Telephone Encounter (Signed)
Other received 2 overnight oximetry reports ? ?09/15/2021-overnight on CPAP and 2 L oxygen ?Duration of study 5 hours 14 minutes ?Time spent less than 88% 0.4 minutes ?Nadir O2 sat of 86% ? ?09/16/2021-overnight on room air ?Duration of study 10 hours 39 minutes ?Time spent less than 88% 98 minutes ?Nadir O2 sat of 79% ? ?On checking with the patient her first study was done just on CPAP and not 2 L oxygen.  I suspect the notation of 2 L oxygen was an error by the DME company. ? ?It appears that with use of CPAP she has adequate oxygen levels and does not require supplemental oxygen at night.  We can reevaluate and consider repeating the overnight oximetry later this year.  Please inform the patient ? ?Chilton Greathouse MD ?[facility] ?11/23/2021, 4:01 PM  ?

## 2021-11-23 NOTE — Telephone Encounter (Signed)
Called and spoke with patient.  ONO results and recommendations given. Understanding stated.  Nothing further at this time.  ?

## 2021-11-23 NOTE — Telephone Encounter (Signed)
Patient called stating [facility] cancelled her POC order placed 11/06/21. Patient is very anxious and is needing POC for upcoming trip. ?I  called and spoke with Aline August and she stated POC order needed to be faxed back to [facility].  Order was cancelled, because OV notes were not sent to [facility]. ? ? ?Message routed to [facility]  ? ?

## 2021-11-26 NOTE — Telephone Encounter (Signed)
I called [facility] office in [place] and spoke to [place].  They are on epic & send all info to other [facility] offices.  She states [facility] closed the order because pt is not an existing pt of their's and they can't fulfill a poc order on a pt they don't currently service.  ?

## 2021-11-26 NOTE — Telephone Encounter (Signed)
POC order has been placed and needs to be sent to [facility] as patient is established with them for her CPAP. Nothing further needed at this time. ?

## 2021-11-27 ENCOUNTER — Telehealth: Payer: Self-pay | Admitting: Pulmonary Disease

## 2021-11-27 NOTE — Telephone Encounter (Signed)
Message was routed to [facility] 11/16/21 for POC order to be faxed to [facility]. I had spoke with Shanda Bumps at [facility] who stated order was closed, but if order was sent back to [facility] POC order could be processed.  Patient does not want order going to [facility] and asked for [facility] to be DME.  Patient is upset because she has upcoming trip and needs POC for plane. ? ? ?Message routed to [facility] ? ? ? ?

## 2021-11-28 NOTE — Telephone Encounter (Signed)
Per previous phone message I called [facility] office yesterday and spoke to Celina about order being closed out in [place] sending info to them.  She states order was closed because pt is not a client of [facility] and they can't just give pt a poc that has not used them.  Lattie Haw T had spoke to [place] in [place] so I called [employer] & spoke to Corporate investment banker.  She stated same thing Estill Bamberg said.  She said I can have pt to call her.  I called pt & she spoke to [place] and then called me back.  She has requested order to be sent to [facility] and she will pay out of pocket.  I am faxing order that was placed yesterday for [facility] and sending to [facility].  Nothing further needed. ?

## 2022-02-04 ENCOUNTER — Telehealth (INDEPENDENT_AMBULATORY_CARE_PROVIDER_SITE_OTHER): Payer: Medicare Other | Admitting: Adult Health

## 2022-02-04 ENCOUNTER — Telehealth: Payer: Self-pay | Admitting: Pulmonary Disease

## 2022-02-04 ENCOUNTER — Encounter: Payer: Self-pay | Admitting: Adult Health

## 2022-02-04 DIAGNOSIS — J84112 Idiopathic pulmonary fibrosis: Secondary | ICD-10-CM

## 2022-02-04 DIAGNOSIS — U071 COVID-19: Secondary | ICD-10-CM

## 2022-02-04 DIAGNOSIS — I2584 Coronary atherosclerosis due to calcified coronary lesion: Secondary | ICD-10-CM | POA: Diagnosis not present

## 2022-02-04 DIAGNOSIS — G4733 Obstructive sleep apnea (adult) (pediatric): Secondary | ICD-10-CM

## 2022-02-04 DIAGNOSIS — J4530 Mild persistent asthma, uncomplicated: Secondary | ICD-10-CM | POA: Diagnosis not present

## 2022-02-04 DIAGNOSIS — J9611 Chronic respiratory failure with hypoxia: Secondary | ICD-10-CM

## 2022-02-04 DIAGNOSIS — I2510 Atherosclerotic heart disease of native coronary artery without angina pectoris: Secondary | ICD-10-CM | POA: Diagnosis not present

## 2022-02-04 MED ORDER — PREDNISONE 20 MG PO TABS: 20.0000 mg | ORAL_TABLET | Freq: Every day | ORAL | 0 refills | Status: DC

## 2022-02-04 MED ORDER — MOLNUPIRAVIR EUA 200MG CAPSULE: 4.0000 | ORAL_CAPSULE | Freq: Two times a day (BID) | ORAL | 0 refills | Status: AC

## 2022-02-04 NOTE — Progress Notes (Signed)
Virtual Visit via Video Note  I connected with Kathryn Scott on 02/04/22 at  2:30 PM EDT by a video enabled telemedicine application and verified that I am speaking with the correct person using two identifiers.  Location: Patient: Home  Provider: Office    I discussed the limitations of evaluation and management by telemedicine and the availability of in person appointments. The patient expressed understanding and agreed to proceed.  History of Present Illness: 76 year old female followed for asthma and IPF, obstructive sleep apnea on CPAP  Today's video visit is for an acute office visit.  Patient complains that she tested positive for COVID-19 infection today.  She has been fully vaccinated.  Patient complains of 4 -5 days of low-grade fever, nasal congestion cough and fatigue. Has seen low oxygen levels intermittently this last couple of days at 84-85%. Normal at rest. Puts on her oxygen for short period of time.  Was on [place] cruise last week.  Denies hemoptysis, chest pain, orthopnea, calf pain or edema. She remains on Breo inhaler daily, Singulair daily. Says she is eating well.  No nausea vomiting or diarrhea.  Has been fully vaccinated for COVID-19 with last COVID vaccine booster 4 weeks ago.   Remains on CPAP At bedtime  for OSA .  Says she is not having any issues wears her CPAP every single night.  Has Oxygen POC . Can use with activity as needed. Uses 2l/m with activity on occasion or if she flys.   Past Medical History:  Diagnosis Date   Asthma    Diabetes mellitus without complication (HCC)    Hypertension     Current Outpatient Medications on File Prior to Visit  Medication Sig Dispense Refill   albuterol (VENTOLIN HFA) 108 (90 Base) MCG/ACT inhaler Inhale 2 puffs into the lungs every 6 (six) hours as needed for wheezing or shortness of breath. 8 g 6   amLODipine (NORVASC) 10 MG tablet Take by mouth.     BREO ELLIPTA 200-25 MCG/INH AEPB USE 1 INHALATION ORALLY     DAILY 60 each 0   carvedilol (COREG) 12.5 MG tablet Take by mouth.     Cholecalciferol 50 MCG (2000 UT) CAPS Take by mouth.     ferrous sulfate 325 (65 FE) MG tablet Take by mouth.     fluticasone (CUTIVATE) 0.05 % cream SMARTSIG:Topical 1-2 Times Daily PRN     levothyroxine (SYNTHROID) 75 MCG tablet Take by mouth.     liraglutide (VICTOZA) 18 MG/3ML SOPN INJECT 0.6 MG DAILY FOR 1 WEEK THEN 1.2 MG DAILY FOR 1 WEEK, THEN 1.8 MG DAILY FROM THEN ON     Magnesium 400 MG CAPS      metFORMIN (GLUCOPHAGE) 500 MG tablet Take 1,000 mg by mouth daily with breakfast.     mometasone (ELOCON) 0.1 % ointment Apply sparingly to affected area 3 times weekly as directed.     montelukast (SINGULAIR) 10 MG tablet TAKE 1 TABLET AT BEDTIME 90 tablet 1   OFEV 150 MG CAPS TAKE 1 CAPSULE BY MOUTH TWICE DAILY WITH FOOD. 60 capsule 5   omeprazole (PRILOSEC) 40 MG capsule Take by mouth.     rosuvastatin (CRESTOR) 40 MG tablet Take 1 tablet (40 mg total) by mouth daily. 90 tablet 3   sertraline (ZOLOFT) 50 MG tablet Take by mouth.     No current facility-administered medications on file prior to visit.       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  No ongoing exposures.  No down pillows or comforters Smoking history: 60 pack year smoker, quit in 1987 Travel history: Originally from [place]. No recent travel Relevant family history: Father died of lung cancer.  He was a smoker.  Assessment and Plan: COVID-19 infection.  Due to patient's age underlying comorbidities feel that she would benefit from antiviral therapy.  Patient education was given.  We will continue with supportive care. Close follow-up in 2 weeks Begin Molnupiravir for 5 days .  Symptomatic treatment.  Quarantine information reviewed We went over in detail red flag symptoms to watch out for and to seek  emergency room care.  Chronic respiratory failure.  Patient is to use oxygen to maintain O2 saturations greater than 88 to 9%.  Patient is advised if her oxygen demand increased she will need to seek emergency room care.  Asthma-mild flare with underlying COVID-19 infection.  We will treat with a short course of prednisone 20 mg for 5 days.  Continue on maintenance inhaler with Breo.  Albuterol inhaler as needed  Sleep apnea continue on CPAP at bedtime.  CPAP care discussed.  Advised to change CPAP supplies soon.  IPF -continue on Ofev.  DM -steroid education given.  Plan  Patient Instructions  Begin Molnupiravir for 5 days.  Tylenol As needed   Mucinex DM Twice daily  As needed  cough/congestion  Oxygen 2l/m to keep Oxygen sats >90%.  Prednisone 20mg  daily for 5 days  If develop worsening dyspnea, vomiting , inability to eat or drink, hemoptysis or calf pain , etc, see emergency care  Continue on BREO 1 puff daily  Albuterol inhaler As needed   Continue on CPAP At bedtime   Follow up in 2 weeks with Dr. and As needed   Please contact office for sooner follow up if symptoms do not improve or worsen or seek emergency care       Follow Up Instructions:    I discussed the assessment and treatment plan with the patient. The patient was provided an opportunity to ask questions and all were answered. The patient agreed with the plan and demonstrated an understanding of the instructions.   The patient was advised to call back or seek an in-person evaluation if the symptoms worsen or if the condition fails to improve as anticipated.  I provided 30 minutes of non-face-to-face time during this encounter.   Isaiah Serge, NP

## 2022-02-04 NOTE — Patient Instructions (Signed)
Begin Molnupiravir for 5 days.  Tylenol As needed   Mucinex DM Twice daily  As needed  cough/congestion  Oxygen 2l/m to keep Oxygen sats >90%.  Prednisone 20mg  daily for 5 days  If develop worsening dyspnea, vomiting , inability to eat or drink, hemoptysis or calf pain , etc, see emergency care  Continue on BREO 1 puff daily  Albuterol inhaler As needed   Continue on CPAP At bedtime   Follow up in 2 weeks with Dr. and As needed   Please contact office for sooner follow up if symptoms do not improve or worsen or seek emergency care

## 2022-02-04 NOTE — Telephone Encounter (Signed)
Called and spoke with patient. She stated that she tested positive for COVID this morning. She just returned home yesterday from an [place] cruise. Her symptoms started on 06/29 and she assumed she had an sinus infection. She had increased pressure around her nose/eyes, runny nose and fatigue. Once she got back home on 07/02, she became to feel worse. She decided to sleep all day to see if this will help. She woke up this morning and did not notice a change in her symptoms so she decided to test herself.   She confirmed that she is not currently on any antivirals.   Pharmacy is [facility] in [place].   TP, can you please advise since Dr. Isaiah Serge is not available today? Thanks!

## 2022-02-04 NOTE — Telephone Encounter (Signed)
Can set up video visit with me today to evaluate and treatment plan   Please contact office for sooner follow up if symptoms do not improve or worsen or seek emergency care

## 2022-02-04 NOTE — Telephone Encounter (Signed)
Spoke to patient and scheduled mychart visit 02/04/2022 at 2:30. Nothing further needed.

## 2022-02-06 ENCOUNTER — Other Ambulatory Visit: Payer: Self-pay | Admitting: Pulmonary Disease

## 2022-02-13 ENCOUNTER — Encounter: Payer: Self-pay | Admitting: Cardiology

## 2022-02-13 ENCOUNTER — Ambulatory Visit: Payer: Medicare Other | Admitting: Cardiology

## 2022-02-13 NOTE — Telephone Encounter (Signed)
From patient.

## 2022-02-18 ENCOUNTER — Telehealth: Payer: Self-pay | Admitting: Pharmacist

## 2022-02-18 ENCOUNTER — Encounter: Payer: Self-pay | Admitting: Pulmonary Disease

## 2022-02-18 ENCOUNTER — Ambulatory Visit (INDEPENDENT_AMBULATORY_CARE_PROVIDER_SITE_OTHER): Payer: Medicare Other | Admitting: Pulmonary Disease

## 2022-02-18 VITALS — BP 116/60 | HR 73 | Temp 98.1°F | Ht 66.0 in | Wt 155.4 lb

## 2022-02-18 DIAGNOSIS — I2510 Atherosclerotic heart disease of native coronary artery without angina pectoris: Secondary | ICD-10-CM | POA: Diagnosis not present

## 2022-02-18 DIAGNOSIS — I2584 Coronary atherosclerosis due to calcified coronary lesion: Secondary | ICD-10-CM | POA: Diagnosis not present

## 2022-02-18 DIAGNOSIS — J84112 Idiopathic pulmonary fibrosis: Secondary | ICD-10-CM

## 2022-02-18 DIAGNOSIS — Z5181 Encounter for therapeutic drug level monitoring: Secondary | ICD-10-CM | POA: Diagnosis not present

## 2022-02-18 LAB — PULMONARY FUNCTION TEST
DL/VA % pred: 87 %
DL/VA: 3.56 ml/min/mmHg/L
DLCO cor % pred: 60 %
DLCO cor: 12.36 ml/min/mmHg
DLCO unc % pred: 60 %
DLCO unc: 12.36 ml/min/mmHg
FEF 25-75 Post: 2.33 L/sec
FEF 25-75 Pre: 2.22 L/sec
FEF2575-%Change-Post: 4 %
FEF2575-%Pred-Post: 124 %
FEF2575-%Pred-Pre: 118 %
FEV1-%Change-Post: 0 %
FEV1-%Pred-Post: 79 %
FEV1-%Pred-Pre: 79 %
FEV1-Post: 1.88 L
FEV1-Pre: 1.88 L
FEV1FVC-%Change-Post: 0 %
FEV1FVC-%Pred-Pre: 113 %
FEV6-%Change-Post: 0 %
FEV6-%Pred-Post: 72 %
FEV6-%Pred-Pre: 73 %
FEV6-Post: 2.19 L
FEV6-Pre: 2.2 L
FEV6FVC-%Pred-Post: 104 %
FEV6FVC-%Pred-Pre: 104 %
FVC-%Change-Post: 0 %
FVC-%Pred-Post: 69 %
FVC-%Pred-Pre: 70 %
FVC-Post: 2.19 L
FVC-Pre: 2.2 L
Post FEV1/FVC ratio: 86 %
Post FEV6/FVC ratio: 100 %
Pre FEV1/FVC ratio: 86 %
Pre FEV6/FVC Ratio: 100 %
RV % pred: 79 %
RV: 1.88 L
TLC % pred: 79 %
TLC: 4.23 L

## 2022-02-18 MED ORDER — OFEV 100 MG PO CAPS: 100.0000 mg | ORAL_CAPSULE | Freq: Two times a day (BID) | ORAL | 1 refills | Status: DC

## 2022-02-18 NOTE — Patient Instructions (Signed)
We will change your Ofev dose to 100 mg twice daily Your recent liver function tests are normal Follow-up in 3 months

## 2022-02-18 NOTE — Progress Notes (Signed)
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  She is to follow with Dr. Leonor Liv at [facility] in [place] with CT scan showing UIP fibrosis.  She was started on Ofev 2020 however developed epistaxis requiring balloon.  Stop her Ofev and aspirin briefly, evaluated by ENT and initiated again with lower dose of Ofev at 100 twice daily around December 2020 which is then increased to 150 mg twice daily in April 2021 with no recurrence of epistaxis  History notable for carpal tunnel, trigger finger for which she had recent surgery on the right hand.  Evaluated 40 years ago for dry eyes and eye infection which is thought to be Sjogren's but negative on rheumatologic evaluation and ?  lip biopsy.  Complains of dyspnea on exertion which is chronic.  No cough, sputum production She has morning stiffness in the small joints of the hand and some difficulty swallowing.  Denies any Raynaud's symptoms, rash Also has daytime somnolence, fatigue in the morning, witnessed snoring and is requesting an evaluation for sleep apnea. Evaluated by Dr. Corliss Skains, rheumatology in July 24, 2021 who ordered AVISE labs  Pets: Has dogs, no birds Occupation: Retired Emergency planning/management officer for [employer] Exposures: Had mold in the old house but moved out of it around 2019.  No ongoing exposures.  No down pillows or comforters Smoking history: 60 pack year smoker, quit in 1987 Travel history: Originally from [place]. No recent travel Relevant family history: Father died of lung cancer.  He was a smoker.  Interim history: Underwent high altitude testing at [facility] and  was told that she needs supplemental oxygen on exertion at high altitude.  She was placed on supplemental oxygen as needed and went on a trip to [place] which was fabulous per the patient.  On return she contracted COVID-19 and given molnupiravir in early July.  She is recovered well from this and is here for review of her PFTs.  Outpatient Encounter Medications as of 02/18/2022  Medication Sig   albuterol (VENTOLIN HFA) 108 (90 Base) MCG/ACT inhaler Inhale 2 puffs into the lungs every 6 (six) hours as needed for wheezing or shortness of breath.   amLODipine (NORVASC) 10 MG tablet Take by mouth.   BREO ELLIPTA 200-25 MCG/ACT AEPB USE 1 INHALATION ORALLY    DAILY   carvedilol (COREG) 12.5 MG tablet Take by mouth.   Cholecalciferol 50 MCG (2000 UT) CAPS Take by mouth.   ferrous sulfate 325 (65 FE) MG tablet Take by mouth.   fluticasone (CUTIVATE) 0.05 % cream SMARTSIG:Topical 1-2 Times Daily PRN   levothyroxine (SYNTHROID) 75 MCG tablet Take by mouth.   liraglutide (VICTOZA) 18 MG/3ML SOPN INJECT 0.6 MG DAILY FOR 1 WEEK THEN 1.2 MG DAILY FOR 1 WEEK, THEN 1.8 MG DAILY FROM THEN ON   Magnesium 400 MG CAPS    metFORMIN (GLUCOPHAGE) 500 MG tablet Take 1,000 mg by mouth daily with breakfast.   mometasone (ELOCON) 0.1 % ointment Apply sparingly to affected area 3 times weekly as directed.   montelukast (SINGULAIR) 10 MG tablet TAKE 1 TABLET AT BEDTIME   OFEV 150 MG CAPS TAKE 1 CAPSULE BY MOUTH  TWICE DAILY WITH FOOD.   omeprazole (PRILOSEC) 40 MG capsule Take by mouth.   sertraline (ZOLOFT) 50 MG tablet Take by mouth.   rosuvastatin (CRESTOR) 40 MG tablet Take 1 tablet (40 mg total) by mouth daily.   [DISCONTINUED] predniSONE (DELTASONE) 20 MG tablet Take 1 tablet (20 mg total) by mouth daily with breakfast.   No facility-administered encounter medications on file as of 02/18/2022.   Physical Exam: Blood pressure (!) 142/58, pulse 85, temperature 98.4 F (36.9 C), temperature source Oral, height 5'  6" (1.676 m), weight 152 lb 3.2 oz (69 kg), SpO2 96 %. Gen:      No acute distress HEENT:  EOMI, sclera anicteric Neck:     No masses; no thyromegaly Lungs:   Bibasal crackles CV:         Regular rate and rhythm; no murmurs Abd:      + bowel sounds; soft, non-tender; no palpable masses, no distension Ext:    No edema; adequate peripheral perfusion Skin:      Warm and dry; no rash Neuro: alert and oriented x 3 Psych: normal mood and affect   Data Reviewed: Imaging: CT chest 06/04/2018-chronic interstitial changes with septal thickening, volume loss and traction bronchiolectasis.    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  High-resolution CT 04/17/2021-mild pulmonary fibrosis and probable UIP pattern with progression compared to prior.  VQ scan 08/23/21-negative for pulmonary embolism I have reviewed the images personally.  PFTs: 02/11/19 FVC 2.24 L (75%), FEV1 1.71 L (74%), FEV1/FVC-77% TLC-3.45 (66%) predicted, DLCO-12.3 (58%)  11/12/2019 FVC 2.24 [68%], FEV1 1.89 [76%], F/F 85, TLC 3.67 [69%], DLCO 14.56 [69%]  10/03/2020 FVC 2.19 [61%], FEV1 1.85 [95%], F/F 84, TLC 3.57 [5%], DLCO 14.17 [69%]  02/18/2022 FVC 2.19 [69%], FEV1 1.88 [79%], F/F 86, TLC 4.23 [79%], DLCO 12.36 [60%] Minimal restriction, moderate diffusion defect  6-minute walk test 04/26/2019- 1365 m  Labs: CTD serologies 11/01/2019 significant for ANA 1:40 nucleolar speckled  Sleep: Home study 11/17/2019-mild sleep apnea, AHI 10.8.  O2 desaturations to 83%.  09/15/2021-overnight on CPAP and 2 L oxygen Duration of study 5 hours 14 minutes Time spent less than 88% 0.4 minutes Nadir O2 sat of 86%   09/16/2021-overnight on room air Duration of study 10 hours 39 minutes Time spent less than 88% 98 minutes Nadir O2 sat of 79%  Assessment:  Pulmonary fibrosis High-res CT reviewed with probable UIP pattern pulmonary fibrosis. Continue Ofev.  We considered further work-up with biopsy  but have deferred after discussion with patient. Serologies notable for borderline elevation in ANA.  This is likely nonspecific.  She has seen Dr. Corliss Skains from rheumatology with no evidence of autoimmune process  Continue on Ofev.  Recent comprehensive metabolic panel last month at primary care was normal [reviewed in carelink] He is having diarrhea with Ofev and would like to go to 100 mg twice a day  Mild sleep apnea Continue AutoSet CPAP.  Download reviewed with good compliance Download reviewed and with CPAP she has adequate oxygen at night and does not need supplemental oxygen.   Plan/Recommendations: Continue Ofev, reduce dose to 100 mg twice daily CPAP  Chilton Greathouse MD [facility] 02/18/2022, 2:18 PM  CC: Joneen Caraway Cromar*

## 2022-02-18 NOTE — Telephone Encounter (Addendum)
Prescription for Ofev 100mg  twice daily sent to [facility].  CMET on 01/22/22 - LFTs wnl  Called patient to notify. She verbalized understanding  01/24/22, PharmD, MPH, BCPS, CPP Clinical Pharmacist (Rheumatology and Pulmonology)  ----- Message from Chesley Mires, MD sent at 02/18/2022  2:31 PM EDT ----- Can you reduce dose of Ofev to 100 mg twice daily as she is having diarrhea.  Thanks.

## 2022-02-18 NOTE — Patient Instructions (Signed)
Full PFT Performed Today  

## 2022-02-18 NOTE — Progress Notes (Signed)
Full PFT Performed Today  

## 2022-03-11 ENCOUNTER — Telehealth: Payer: Self-pay | Admitting: Pulmonary Disease

## 2022-03-12 NOTE — Telephone Encounter (Signed)
[facility] form received in Dr. Shirlee More box.  Will have Dr. Isaiah Serge complete and sign when he returns to office 03/20/22.

## 2022-03-19 ENCOUNTER — Other Ambulatory Visit: Payer: Self-pay | Admitting: Pulmonary Disease

## 2022-03-20 NOTE — Telephone Encounter (Signed)
Signed form received from Dr. Karie Soda with patient to let her know form will be faxed to [facility] and I will mail patient original form for her records.  Copy made for scan. Nothing further at this time.

## 2022-05-22 ENCOUNTER — Encounter: Payer: Self-pay | Admitting: Pulmonary Disease

## 2022-05-22 ENCOUNTER — Ambulatory Visit (INDEPENDENT_AMBULATORY_CARE_PROVIDER_SITE_OTHER): Payer: Medicare Other | Admitting: Pulmonary Disease

## 2022-05-22 VITALS — BP 136/78 | HR 75 | Temp 97.7°F | Ht 66.0 in | Wt 159.0 lb

## 2022-05-22 DIAGNOSIS — Z5181 Encounter for therapeutic drug level monitoring: Secondary | ICD-10-CM | POA: Diagnosis not present

## 2022-05-22 DIAGNOSIS — I2510 Atherosclerotic heart disease of native coronary artery without angina pectoris: Secondary | ICD-10-CM

## 2022-05-22 DIAGNOSIS — Z23 Encounter for immunization: Secondary | ICD-10-CM

## 2022-05-22 DIAGNOSIS — J849 Interstitial pulmonary disease, unspecified: Secondary | ICD-10-CM

## 2022-05-22 DIAGNOSIS — J84112 Idiopathic pulmonary fibrosis: Secondary | ICD-10-CM | POA: Diagnosis not present

## 2022-05-22 DIAGNOSIS — I2584 Coronary atherosclerosis due to calcified coronary lesion: Secondary | ICD-10-CM | POA: Diagnosis not present

## 2022-05-22 MED ORDER — RSVPREF3 VAC RECOMB ADJUVANTED 120 MCG/0.5ML IM SUSR: 0.5000 mL | Freq: Once | INTRAMUSCULAR | 0 refills | Status: AC

## 2022-05-22 MED ORDER — BENZONATATE 200 MG PO CAPS: 200.0000 mg | ORAL_CAPSULE | Freq: Two times a day (BID) | ORAL | 1 refills | Status: DC | PRN

## 2022-05-22 NOTE — Progress Notes (Signed)
Kathryn Scott    [id number]    10-27-1945  Primary Care Physician:Burgess, Waverly Ferrari, MD  Referring Physician: Gareth Eagle, MD 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  She is to follow with Dr. Pryor Ochoa at [facility] in [place] with CT scan showing UIP fibrosis.  She was started on Ofev 2020 however developed epistaxis requiring balloon.  Stop her Ofev and aspirin briefly, evaluated by ENT and initiated again with lower dose of Ofev at 100 twice daily around December 2020 which is then increased to 150 mg twice daily in April 2021 with no recurrence of epistaxis  History notable for carpal tunnel, trigger finger for which she had recent surgery on the right hand.  Evaluated 40 years ago for dry eyes and eye infection which is thought to be Sjogren's but negative on rheumatologic evaluation and ?  lip biopsy.  Complains of dyspnea on exertion which is chronic.  No cough, sputum production She has morning stiffness in the small joints of the hand and some difficulty swallowing.  Denies any Raynaud's symptoms, rash Also has daytime somnolence, fatigue in the morning, witnessed snoring and is requesting an evaluation for sleep apnea. Evaluated by Dr. Estanislado Pandy, rheumatology in July 24, 2021 who ordered AVISE labs with no evidence of autoimmune process.  Underwent high altitude testing at [facility] in summer of 2023 and was told that she needs supplemental oxygen on exertion at high altitude.  She was placed on supplemental oxygen as needed and went on a trip to [place] which was fabulous per the patient.  Pets: Has dogs, no birds Occupation: Retired Government social research officer for [employer] Exposures: Had mold in the old house but moved out of it around 2019.  No  ongoing exposures.  No down pillows or comforters Smoking history: 60 pack year smoker, quit in 1987 Travel history: Originally from [place]. No recent travel Relevant family history: Father died of lung cancer.  He was a smoker.  Interim history: She contracted COVID-19 and given molnupiravir in early July 2023.  She is recovered well from this  Ofev reduced to 100 mg twice daily as she was having diarrhea at the higher dose.  She has done well with this change with improvement in symptoms and she is gaining weight Complains of ongoing nonproductive cough Notes that her oxygen level drops while driving.  Outpatient Encounter Medications as of 05/22/2022  Medication Sig   albuterol (VENTOLIN HFA) 108 (90 Base) MCG/ACT inhaler Inhale 2 puffs into the lungs every 6 (six) hours as needed for wheezing or shortness of breath.   amLODipine (NORVASC) 10 MG tablet Take by mouth.   BREO ELLIPTA 200-25 MCG/ACT AEPB USE 1 INHALATION ORALLY    DAILY   carvedilol (COREG) 12.5 MG tablet Take by mouth.   Cholecalciferol 50 MCG (2000 UT) CAPS Take by mouth.   ferrous sulfate 325 (65 FE) MG tablet Take by mouth.   fluticasone (CUTIVATE) 0.05 % cream SMARTSIG:Topical 1-2 Times Daily PRN   levothyroxine (SYNTHROID) 75 MCG tablet Take by mouth.   liraglutide (VICTOZA) 18 MG/3ML SOPN INJECT 0.6 MG DAILY FOR 1 WEEK THEN 1.2 MG DAILY FOR 1 WEEK, THEN 1.8 MG DAILY FROM THEN ON   Magnesium 400 MG CAPS    metFORMIN (GLUCOPHAGE) 500 MG tablet  Take 1,000 mg by mouth daily with breakfast.   mometasone (ELOCON) 0.1 % ointment Apply sparingly to affected area 3 times weekly as directed.   montelukast (SINGULAIR) 10 MG tablet TAKE 1 TABLET AT BEDTIME   Nintedanib (OFEV) 100 MG CAPS Take 1 capsule (100 mg total) by mouth 2 (two) times daily. **NOTE DOSE REDUCTION**   omeprazole (PRILOSEC) 40 MG capsule Take by mouth.   sertraline (ZOLOFT) 50 MG tablet Take by mouth.   rosuvastatin (CRESTOR) 40 MG tablet Take 1 tablet  (40 mg total) by mouth daily.   No facility-administered encounter medications on file as of 05/22/2022.   Physical Exam: Blood pressure 136/78, pulse 75, temperature 97.7 F (36.5 C), temperature source Oral, height 5\' 6"  (1.676 m), weight 159 lb (72.1 kg), SpO2 98 %. Gen:      No acute distress HEENT:  EOMI, sclera anicteric Neck:     No masses; no thyromegaly Lungs:    Bibasal crackles CV:         Regular rate and rhythm; no murmurs Abd:      + bowel sounds; soft, non-tender; no palpable masses, no distension Ext:    No edema; adequate peripheral perfusion Skin:      Warm and dry; no rash Neuro: alert and oriented x 3 Psych: normal mood and affect   Data Reviewed: Imaging: CT chest 06/04/2018-chronic interstitial changes with septal thickening, volume loss and traction bronchiolectasis.    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  High-resolution CT 04/17/2021-mild pulmonary fibrosis and probable UIP pattern with progression compared to prior.  VQ scan 08/23/21-negative for pulmonary embolism I have reviewed the images personally.  PFTs: 02/11/19 FVC 2.24 L (75%), FEV1 1.71 L (74%), FEV1/FVC-77% TLC-3.45 (66%) predicted, DLCO-12.3 (58%)  11/12/2019 FVC 2.24 [68%], FEV1 1.89 [76%], F/F 85, TLC 3.67 [69%], DLCO 14.56 [69%]  10/03/2020 FVC 2.19 [61%], FEV1 1.85 [95%], F/F 84, TLC 3.57 [5%], DLCO 14.17 [69%]  02/18/2022 FVC 2.19 [69%], FEV1 1.88 [79%], F/F 86, TLC 4.23 [79%], DLCO 12.36 [60%] Minimal restriction, moderate diffusion defect  6-minute walk test 04/26/2019- 1365 m  Labs: CTD serologies 11/01/2019 significant for ANA 1:40 nucleolar speckled  Sleep: Home study 11/17/2019-mild sleep apnea, AHI 10.8.  O2 desaturations to 83%.  09/15/2021-overnight on CPAP and 2 L oxygen Duration of study 5 hours 14 minutes Time spent less than 88% 0.4 minutes Nadir O2 sat of 86%   09/16/2021-overnight on room air Duration of study 10 hours 39  minutes Time spent less than 88% 98 minutes Nadir O2 sat of 79%  Assessment:  Pulmonary fibrosis High-res CT reviewed with probable UIP pattern pulmonary fibrosis. Continue Ofev.  We considered further work-up with biopsy but have deferred after discussion with patient. Serologies notable for borderline elevation in ANA.  This is likely nonspecific.  She has seen Dr. 11/14/2021 from rheumatology with no evidence of autoimmune process  Continue on Ofev at a reduced dose.  She did not tolerate a higher dose due to diarrhea.  Recent comprehensive metabolic panel in June at primary care was normal [reviewed in carelink]  With a drop in O2 sats while driving I advised her to use her portable concentrator on long drives We will continue to monitor closely Order CT in 3 months and return to clinic Tessalon for cough  She is interested in participating in clinical trials for IPF.  We will send a message to the research team.  Mild sleep apnea Continue AutoSet CPAP.  Download reviewed with  good compliance Download reviewed and with CPAP she has adequate oxygen at night and does not need supplemental oxygen.   Plan/Recommendations: Continue Ofev at 100 mg twice daily Tessalon for cough Follow-up high-res CT CPAP  Chilton Greathouse MD [facility] 05/22/2022, 1:11 PM  CC: Joneen Caraway Cromar*

## 2022-05-22 NOTE — Patient Instructions (Signed)
I am glad you are doing better on lower dose of Ofev Your labs in June at [facility] were normal Will order Tessalon 200 mg twice daily as needed for cough We will order high-res CT in the next 3 to 4 months.  Return to clinic after CT scan.

## 2022-05-22 NOTE — Addendum Note (Signed)
Addended by: Chanetta Marshall on: 05/22/2022 01:52 PM   Modules accepted: Orders

## 2022-06-25 ENCOUNTER — Telehealth: Payer: Self-pay | Admitting: *Deleted

## 2022-06-25 NOTE — Telephone Encounter (Signed)
patient would like to discuss being a candidate for pulmonix.    Patient would also like to adjust medications. Patient states she has significant diahhrea when taking OSEV and would like to see if she can switch to something else possibly Colespipol?    [phone]

## 2022-06-25 NOTE — Telephone Encounter (Signed)
Called and spoke with pt who states that she has been having diarrhea even after the dose of the OFEV was reduced. Pt said she wonders if this could possibly be from IBS. Stated that her daughter-n-law mentioned to her that she could possibly take colestipol. Stated to pt that she could discuss that with either her PCP or GI if she had a GI doctor and she verbalized understanding. Stated to pt to make sure she is taking imodium to help with the diarrhea and she stated that she does.   Also while speaking with pt, she stated that she discussed research with Dr. Isaiah Serge at last OV but has not heard anything about this if she would be able to be considered a candidate for a research study. Stated to pt that I would check with MR about this and she verbalized understanding. Routing to MR.

## 2022-07-02 NOTE — Telephone Encounter (Signed)
Spoke with pt and reviewed Dr. Jane Canary advice. Pt states she had no commitments during December and would love to participate.  Routing to Dr. Marchelle Gearing as Lorain Childes. Nothing further needed at this time.

## 2022-07-02 NOTE — Telephone Encounter (Signed)
ATC LVMTCB x 1  

## 2022-07-02 NOTE — Telephone Encounter (Signed)
Attempted to call pt but unable to reach. Left message for her to return call. 

## 2022-07-02 NOTE — Telephone Encounter (Signed)
Yes we can consider her for research.  Probably be every 2024 before we can include her for a trial.  We have various trials for pulmonary fibrosis and it would be admirable for her to participate to help develop new drugs.  Please let her know that I will give her name to the research team and then we will do what is called "prescreen" so we can choose the right study for her and give her a call  There is a possibility we might be able to get her in this month of December itself.   How available if she?   Please send message back  [address] [place] [address]

## 2022-07-02 NOTE — Telephone Encounter (Signed)
Patient is returning phone call. Patient phone number is [phone].

## 2022-07-25 ENCOUNTER — Other Ambulatory Visit: Payer: Self-pay | Admitting: Pulmonary Disease

## 2022-07-25 DIAGNOSIS — J84112 Idiopathic pulmonary fibrosis: Secondary | ICD-10-CM

## 2022-07-25 DIAGNOSIS — Z5181 Encounter for therapeutic drug level monitoring: Secondary | ICD-10-CM

## 2022-07-25 DIAGNOSIS — J849 Interstitial pulmonary disease, unspecified: Secondary | ICD-10-CM

## 2022-07-25 NOTE — Telephone Encounter (Signed)
Refill of Ofev is pending labs. Patient will go to [facility] in [place].  She states she has 2-3 weeks of medication left.  Lab orders faxed P: [phone] Fax: [phone]  Chesley Mires, PharmD, MPH, BCPS Clinical Pharmacist (Rheumatology and Pulmonology)

## 2022-07-31 LAB — HEPATIC FUNCTION PANEL
ALT: 22 IU/L (ref 0–32)
AST: 21 IU/L (ref 0–40)
Albumin: 3.9 g/dL (ref 3.8–4.8)
Alkaline Phosphatase: 97 IU/L (ref 44–121)
Bilirubin Total: <0.2 mg/dL (ref 0.0–1.2)
Bilirubin, Direct: <0.1 mg/dL (ref 0.00–0.40)
Total Protein: 7.2 g/dL (ref 6.0–8.5)

## 2022-08-01 NOTE — Telephone Encounter (Signed)
Latest Ref Rng & Units 07/30/2022   12:30 PM 11/05/2021    3:25 PM 05/08/2021    2:24 PM  Hepatic Function  Total Protein 6.0 - 8.5 g/dL 7.2  7.6  7.3   Albumin 3.8 - 4.8 g/dL 3.9  3.7  3.9   AST 0 - 40 IU/L 21  18  32   ALT 0 - 32 IU/L 22  14  33   Alk Phosphatase 44 - 121 IU/L 97  75  69   Total Bilirubin 0.0 - 1.2 mg/dL <0.2  0.3  0.3   Bilirubin, Direct 0.00 - 0.40 mg/dL <0.10       LFTs on 07/30/22 wnl  [facility]ill for Ofev 159m [facility]  DKnox Saliva PharmD, MPH, BCPS, CPP Clinical Pharmacist (Rheumatology and Pulmonology)

## 2022-08-02 ENCOUNTER — Telehealth: Payer: Self-pay | Admitting: Pulmonary Disease

## 2022-08-02 NOTE — Telephone Encounter (Signed)
Pt has CT on 1/19. She received a new ID number for her BCBS supplement for the upcoming new year. Because the old ID is still in affect, I am unable to add the new ID for pt. FYI for the new year so that pt can still be seen for CT. BCBS Supplement, Member ID [id number]

## 2022-08-02 NOTE — Telephone Encounter (Signed)
I spoke to the pt & thanked her for updating [place].  Please update this when we return to the office next year.

## 2022-08-06 ENCOUNTER — Telehealth: Payer: Self-pay | Admitting: Pulmonary Disease

## 2022-08-06 ENCOUNTER — Other Ambulatory Visit: Payer: Self-pay | Admitting: Pulmonary Disease

## 2022-08-06 MED ORDER — PAXLOVID (300/100) 20 X 150 MG & 10 X 100MG PO TBPK: 3.0000 | ORAL_TABLET | Freq: Two times a day (BID) | ORAL | 0 refills | Status: DC

## 2022-08-06 NOTE — Telephone Encounter (Signed)
Called and spoke with pt who states she had a runny nose and cough which began yesterday 1/1. Pt did a covid test which was negative.   Pt now has symptoms including a sore throat, still with runny nose and cough, and also has been vomiting.  Pt took another covid test today 1/2 which came back positive. Pt is wanting to know what could be recommended.  Dr. Vaughan Browner, please advise

## 2022-08-06 NOTE — Telephone Encounter (Signed)
I have sent in a prescription for Paxlovid to her pharmacy. Agree with over-the-counter Mucinex and cough medication, decongestants

## 2022-08-06 NOTE — Telephone Encounter (Signed)
Called and spoke with pt letting her know recs per Dr. Mannam and she verbalized understanding. Nothing further needed. 

## 2022-08-16 ENCOUNTER — Telehealth: Payer: Self-pay

## 2022-08-16 NOTE — Telephone Encounter (Signed)
I was unable to add coverage for pt. I ran the information in [place]. The plan came back to someone else. Who is not a patient here.  Unable to verify coverage at this time

## 2022-08-16 NOTE — Telephone Encounter (Signed)
Received fax from [facility] stating that pt has received a temporary supply of medication but that a new PA would be required.  Submitted a Prior Authorization request to [facility] for OFEV via CoverMyMeds. Will update once we receive a response.  Key: [id number]

## 2022-08-19 ENCOUNTER — Other Ambulatory Visit (HOSPITAL_COMMUNITY): Payer: Self-pay

## 2022-08-19 NOTE — Telephone Encounter (Signed)
Received notification from [facility] regarding a prior authorization for [place]. Authorization has been APPROVED from 08/16/2022 until further notice. Approval letter sent to scan center.  Unable to run test claim because refill too soon. Patient  may be able to continue to  fill through [facility] (pulmonary fibrosis team): [phone]  Authorization # [id number]  Knox Saliva, PharmD, MPH, BCPS, CPP Clinical Pharmacist (Rheumatology and Pulmonology)

## 2022-08-23 ENCOUNTER — Ambulatory Visit (HOSPITAL_COMMUNITY)
Admission: RE | Admit: 2022-08-23 | Discharge: 2022-08-23 | Disposition: A | Discharge status: Home / Self Care | Payer: Medicare Other | Source: Ambulatory Visit | Attending: Pulmonary Disease | Admitting: Pulmonary Disease

## 2022-08-23 DIAGNOSIS — J849 Interstitial pulmonary disease, unspecified: Secondary | ICD-10-CM

## 2022-08-28 ENCOUNTER — Ambulatory Visit (INDEPENDENT_AMBULATORY_CARE_PROVIDER_SITE_OTHER): Payer: Medicare Other | Admitting: Pulmonary Disease

## 2022-08-28 ENCOUNTER — Encounter: Payer: Self-pay | Admitting: Pulmonary Disease

## 2022-08-28 VITALS — BP 114/70 | HR 70 | Temp 98.0°F | Ht 66.0 in | Wt 157.8 lb

## 2022-08-28 DIAGNOSIS — J84112 Idiopathic pulmonary fibrosis: Secondary | ICD-10-CM | POA: Diagnosis not present

## 2022-08-28 DIAGNOSIS — Z5181 Encounter for therapeutic drug level monitoring: Secondary | ICD-10-CM

## 2022-08-28 NOTE — Patient Instructions (Signed)
I am glad recovered well from the [place] His CT scan shows stable scarring in the lung which is good news Continue therapy as prescribed Follow-up in 6 months

## 2022-08-28 NOTE — Progress Notes (Signed)
Kathryn Scott    [id number]    Sep 28, 1945  Primary Care Physician:Burgess, Roxana Hires, MD  Referring Physician: Casey Burkitt, MD 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  She is to follow with Dr. Leonor Liv at [facility] in [place] with CT scan showing UIP fibrosis.  She was started on Ofev 2020 however developed epistaxis requiring balloon.  Stop her Ofev and aspirin briefly, evaluated by ENT and initiated again with lower dose of Ofev at 100 twice daily around December 2020 which is then increased to 150 mg twice daily in April 2021 with no recurrence of epistaxis  History notable for carpal tunnel, trigger finger for which she had recent surgery on the right hand.  Evaluated 40 years ago for dry eyes and eye infection which is thought to be Sjogren's but negative on rheumatologic evaluation and ?  lip biopsy.  Complains of dyspnea on exertion which is chronic.  No cough, sputum production She has morning stiffness in the small joints of the hand and some difficulty swallowing.  Denies any Raynaud's symptoms, rash Also has daytime somnolence, fatigue in the morning, witnessed snoring and is requesting an evaluation for sleep apnea. Evaluated by Dr. Corliss Skains, rheumatology in July 24, 2021 who ordered AVISE labs with no evidence of autoimmune process.  Underwent high altitude testing at [facility] in summer of 2023 and was told that she needs supplemental oxygen on exertion at high altitude.  She was placed on supplemental oxygen as needed and went on a trip to [place] which was fabulous per the patient.  Ofev reduced to 100 mg twice daily in July 2023 as she was having diarrhea at the higher dose.  She has done well with this change with improvement in symptoms and  she is gaining weight  Pets: Has dogs, no birds Occupation: Retired Emergency planning/management officer for [employer] Exposures: Had mold in the old house but moved out of it around 2019.  No ongoing exposures.  No down pillows or comforters Smoking history: 60 pack year smoker, quit in 1987 Travel history: Originally from [place]. No recent travel Relevant family history: Father died of lung cancer.  He was a smoker.  Interim history: She contracted COVID-19 and given molnupiravir in early July 2023.   Contracted COVID again in early January 2024 and was given Paxlovid  She has recovered well from the infection.  Complains of ongoing baseline nonproductive cough with some rib tenderness She notes transient oxygen desats to mid 80s on her O2 ring which recovers within less than a minute.  Outpatient Encounter Medications as of 08/28/2022  Medication Sig   albuterol (VENTOLIN HFA) 108 (90 Base) MCG/ACT inhaler Inhale 2 puffs into the lungs every 6 (six) hours as needed for wheezing or shortness of breath.   amLODipine (NORVASC) 10 MG tablet Take by mouth.   BREO ELLIPTA 200-25 MCG/ACT AEPB USE 1 INHALATION ORALLY    DAILY   carvedilol (COREG) 12.5 MG tablet Take by mouth.   Cholecalciferol 50 MCG (2000 UT) CAPS Take by mouth.   ferrous sulfate 325 (65 FE) MG tablet Take by mouth.   fluticasone (CUTIVATE) 0.05 % cream SMARTSIG:Topical 1-2 Times Daily PRN   levothyroxine (SYNTHROID) 75 MCG tablet Take by mouth.   liraglutide (VICTOZA) 18 MG/3ML SOPN INJECT  0.6 MG DAILY FOR 1 WEEK THEN 1.2 MG DAILY FOR 1 WEEK, THEN 1.8 MG DAILY FROM THEN ON   Magnesium 400 MG CAPS    metFORMIN (GLUCOPHAGE) 500 MG tablet Take 1,000 mg by mouth daily with breakfast.   mometasone (ELOCON) 0.1 % ointment Apply sparingly to affected area 3 times weekly as directed.   montelukast (SINGULAIR) 10 MG tablet TAKE 1 TABLET AT BEDTIME   Nintedanib (OFEV) 100 MG CAPS TAKE 1 CAPSULE BY MOUTH TWICE DAILY WITH FOOD.   omeprazole  (PRILOSEC) 40 MG capsule Take by mouth.   sertraline (ZOLOFT) 50 MG tablet Take by mouth.   rosuvastatin (CRESTOR) 40 MG tablet Take 1 tablet (40 mg total) by mouth daily.   [DISCONTINUED] benzonatate (TESSALON) 200 MG capsule Take 1 capsule (200 mg total) by mouth 2 (two) times daily as needed for cough.   [DISCONTINUED] nirmatrelvir & ritonavir (PAXLOVID, 300/100,) 20 x 150 MG & 10 x 100MG  TBPK TAKE AS DIRECTED ON PACK FOR 5 DAYS   No facility-administered encounter medications on file as of 08/28/2022.   Physical Exam: Blood pressure 114/70, pulse 70, temperature 98 F (36.7 C), temperature source Oral, height 5\' 6"  (1.676 m), weight 157 lb 12.8 oz (71.6 kg), SpO2 99 %. Gen:      No acute distress HEENT:  EOMI, sclera anicteric Neck:     No masses; no thyromegaly Lungs:    Bibasal crackles CV:         Regular rate and rhythm; no murmurs Abd:      + bowel sounds; soft, non-tender; no palpable masses, no distension Ext:    No edema; adequate peripheral perfusion Skin:      Warm and dry; no rash Neuro: alert and oriented x 3 Psych: normal mood and affect   Data Reviewed: Imaging: CT chest 06/04/2018-chronic interstitial changes with septal thickening, volume loss and traction bronchiolectasis.    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  High-resolution CT 04/17/2021-mild pulmonary fibrosis and probable UIP pattern with progression compared to prior.  VQ scan 08/23/21-negative for pulmonary embolism  High-resolution CT 08/23/2022-unchanged pulmonary fibrosis which is stable compared to 2022. I have reviewed the images personally.  PFTs: 02/11/19 FVC 2.24 L (75%), FEV1 1.71 L (74%), FEV1/FVC-77% TLC-3.45 (66%) predicted, DLCO-12.3 (58%)  11/12/2019 FVC 2.24 [68%], FEV1 1.89 [76%], F/F 85, TLC 3.67 [69%], DLCO 14.56 [69%]  10/03/2020 FVC 2.19 [61%], FEV1 1.85 [95%], F/F 84, TLC 3.57 [5%], DLCO 14.17 [69%]  02/18/2022 FVC 2.19 [69%], FEV1 1.88 [79%],  F/F 86, TLC 4.23 [79%], DLCO 12.36 [60%] Minimal restriction, moderate diffusion defect  6-minute walk test 04/26/2019- 4 m  Labs: CTD serologies 11/01/2019 significant for ANA 1:40 nucleolar speckled  Hepatic panel 07/30/2022-normal  Sleep: Home study 11/17/2019-mild sleep apnea, AHI 10.8.  O2 desaturations to 83%.  09/15/2021-overnight on CPAP and 2 L oxygen Duration of study 5 hours 14 minutes Time spent less than 88% 0.4 minutes Nadir O2 sat of 86%   09/16/2021-overnight on room air Duration of study 10 hours 39 minutes Time spent less than 88% 98 minutes Nadir O2 sat of 79%  Assessment:  Pulmonary fibrosis High-res CT reviewed with probable UIP pattern pulmonary fibrosis. Continue Ofev.  We considered further work-up with biopsy but have deferred after discussion with patient. Serologies notable for borderline elevation in ANA.  This is likely nonspecific.  She has seen Dr. Estanislado Pandy from rheumatology with no evidence of autoimmune process  Continue on Ofev at a reduced dose.  She did not tolerate a higher dose due to diarrhea.  Recent comprehensive metabolic panel in June at primary care was normal [reviewed in carelink]  Monitor transient drops in O2 sats.  She does have a portable concentrator at home which she can use CT reviewed shows stable pulmonary fibrosis  She is interested in participating in clinical trials for IPF.  We will send a message to the research team.  Mild sleep apnea Continue AutoSet CPAP.  Download reviewed with good compliance Download reviewed and with CPAP she has adequate oxygen at night and does not need supplemental oxygen.   Plan/Recommendations: Continue Ofev at 100 mg twice daily CPAP  Marshell Garfinkel MD [facility] 08/28/2022, 9:40 AM  CC: Kirstie Peri Cromar*

## 2022-11-20 ENCOUNTER — Ambulatory Visit: Payer: Medicare Other | Admitting: Cardiology

## 2022-11-20 ENCOUNTER — Encounter: Payer: Self-pay | Admitting: Cardiology

## 2022-11-20 VITALS — BP 131/76 | HR 83 | Resp 16 | Ht 66.0 in | Wt 159.2 lb

## 2022-11-20 DIAGNOSIS — I2510 Atherosclerotic heart disease of native coronary artery without angina pectoris: Secondary | ICD-10-CM

## 2022-11-20 DIAGNOSIS — R072 Precordial pain: Secondary | ICD-10-CM

## 2022-11-20 DIAGNOSIS — E782 Mixed hyperlipidemia: Secondary | ICD-10-CM

## 2022-11-20 MED ORDER — FENOFIBRATE 48 MG PO TABS: 48.0000 mg | ORAL_TABLET | Freq: Every day | ORAL | 3 refills | Status: DC

## 2022-11-20 NOTE — Progress Notes (Signed)
Patient referred by Cyndi Lennert* for coronary artery calcification  Subjective:   Kathryn Scott, female    DOB: 09-Oct-1945, 77 y.o.   MRN: [id number]   Chief Complaint  Patient presents with   Coronary artery calcification   Follow-up    1 year    HPI  77 y.o. Caucasian female with hypertension, controlled type II DM, family history of CAD, asthma, hyperlipidemia, allergies, IPF, coronary calcification  Patient has had unchanged dyspnea with mild progression of her IPF on recent high-resolution CT chest.  In addition, she has also noticed episodes of chest pain, that have occurred both with rest and exertion, lasting for few seconds to minutes.  Reviewed recent test results with the patient, details below.     Current Outpatient Medications:    albuterol (VENTOLIN HFA) 108 (90 Base) MCG/ACT inhaler, Inhale 2 puffs into the lungs every 6 (six) hours as needed for wheezing or shortness of breath., Disp: 8 g, Rfl: 6   amLODipine (NORVASC) 10 MG tablet, Take by mouth., Disp: , Rfl:    BREO ELLIPTA 200-25 MCG/ACT AEPB, USE 1 INHALATION ORALLY    DAILY, Disp: 180 each, Rfl: 2   carvedilol (COREG) 12.5 MG tablet, Take by mouth., Disp: , Rfl:    Cholecalciferol 50 MCG (2000 UT) CAPS, Take by mouth., Disp: , Rfl:    ferrous sulfate 325 (65 FE) MG tablet, Take by mouth., Disp: , Rfl:    fluticasone (CUTIVATE) 0.05 % cream, SMARTSIG:Topical 1-2 Times Daily PRN, Disp: , Rfl:    levothyroxine (SYNTHROID) 75 MCG tablet, Take by mouth., Disp: , Rfl:    liraglutide (VICTOZA) 18 MG/3ML SOPN, INJECT 0.6 MG DAILY FOR 1 WEEK THEN 1.2 MG DAILY FOR 1 WEEK, THEN 1.8 MG DAILY FROM THEN ON, Disp: , Rfl:    Magnesium 400 MG CAPS, , Disp: , Rfl:    metFORMIN (GLUCOPHAGE) 500 MG tablet, Take 500 mg by mouth 3 (three) times daily., Disp: , Rfl:    mometasone (ELOCON) 0.1 % ointment, Apply sparingly to affected area 3 times weekly as directed., Disp: , Rfl:    montelukast (SINGULAIR) 10 MG  tablet, TAKE 1 TABLET AT BEDTIME, Disp: 90 tablet, Rfl: 1   Nintedanib (OFEV) 100 MG CAPS, TAKE 1 CAPSULE BY MOUTH TWICE DAILY WITH FOOD., Disp: 60 capsule, Rfl: 5   omeprazole (PRILOSEC) 40 MG capsule, Take by mouth., Disp: , Rfl:    rosuvastatin (CRESTOR) 40 MG tablet, Take 1 tablet (40 mg total) by mouth daily., Disp: 90 tablet, Rfl: 3   sertraline (ZOLOFT) 50 MG tablet, Take by mouth., Disp: , Rfl:   Cardiovascular and other pertinent studies:  EKG 11/20/2022: Sinus rhythm 79 bpm Occasional PAC    CT Chest 04/17/2021: 1. Redemonstrated mild pulmonary fibrosis in a pattern with apical to basal gradient, featuring irregular peripheral interstitial opacity, traction bronchiectasis, and areas of subpleural bronchiolectasis without clear evidence of honeycombing. Fibrotic findings are slightly worsened compared to prior examination. Findings remain categorized as probable UIP per consensus guidelines: Diagnosis of Idiopathic Pulmonary Fibrosis: An Official ATS/ERS/JRS/ALAT Clinical Practice Guideline. Am Rosezetta Schlatter Crit Care Med Vol 198, Iss 5, [phone], Apr 05 2017. 2. Coronary artery disease.   Aortic Atherosclerosis (ICD10-I70.0).   Lexiscan nuclear stress test 11/2017: 1. This is a normal myocardial perfusion study with no evidence of reversible ischemia or infarction. This is a low risk study.  2. There is no prior study available for comparison  3. Based on gated SPECT, LEFT ventricular  ejection fraction is  74%  4. There is no transient ischemic dilation.    Recent labs: 09/02/2022: Glucose 131, BUN/Cr 16/1.1. EGFR 53. Na/K 138/4.8. Rest of the CMP normal H/H 12/39. MCV 87. Platelets 394 HbA1C 6.1% Chol 114, TG 308, HDL 38, LDL 31  01/2022: TSH 3.8 normal   Review of Systems  Cardiovascular:  Positive for chest pain and dyspnea on exertion (Chronic, stable). Negative for leg swelling, palpitations and syncope.         Vitals:   11/20/22 1238  BP: 131/76  Pulse: 83   Resp: 16  SpO2: 96%     Objective:   Physical Exam Vitals and nursing note reviewed.  Constitutional:      General: She is not in acute distress. Neck:     Vascular: No JVD.  Cardiovascular:     Rate and Rhythm: Normal rate and regular rhythm.     Heart sounds: Normal heart sounds. No murmur heard. Pulmonary:     Effort: Pulmonary effort is normal.     Breath sounds: Normal breath sounds. No wheezing or rales.  Musculoskeletal:     Right lower leg: No edema.     Left lower leg: No edema.      ICD-10-CM   1. Coronary artery calcification  I25.10 EKG 12-Lead   I25.84           Assessment & Recommendations:    77 y.o. Caucasian female with hypertension, controlled type II DM, family history of CAD, asthma, hyperlipidemia, allergies, IPF, referred for evaluation of coronary artery disease  Chest pain: Occasional episodes of precordial pain, which are new.  Baseline exertional dyspnea is relatively unchanged, likely reactive. Given her coronary calcification, and the stress test being in 2018, will repeat Lexiscan nuclear stress test.  She is unable to exercise on treadmill due to her baseline exertional dyspnea due to IPF.  TG remains elevated but improved.  She had epistaxis with Vascepa.  In addition to Crestor 40 mg daily, added Tricor 45 mg daily. Repeat lipid panel in 3 months.    F/u in 3 months   Elder Negus, MD Pager: [phone] Office: [phone]

## 2022-12-05 ENCOUNTER — Ambulatory Visit: Payer: Medicare Other

## 2022-12-05 ENCOUNTER — Other Ambulatory Visit: Payer: Self-pay

## 2022-12-05 DIAGNOSIS — R072 Precordial pain: Secondary | ICD-10-CM

## 2022-12-05 DIAGNOSIS — I2510 Atherosclerotic heart disease of native coronary artery without angina pectoris: Secondary | ICD-10-CM

## 2022-12-05 MED ORDER — ROSUVASTATIN CALCIUM 40 MG PO TABS
40.0000 mg | ORAL_TABLET | Freq: Every day | ORAL | 3 refills | Status: DC
Start: 2022-12-05 — End: 2023-02-24

## 2023-02-12 ENCOUNTER — Other Ambulatory Visit (HOSPITAL_COMMUNITY): Payer: Self-pay | Admitting: Cardiology

## 2023-02-12 ENCOUNTER — Telehealth: Payer: Self-pay | Admitting: Pulmonary Disease

## 2023-02-12 NOTE — Telephone Encounter (Signed)
PT asking [place] to call in two RX's:  Montelukast & Breo  She changed to a mail order pharm this year so they need RX's. [facility]  Any questions for her call [phone]

## 2023-02-13 LAB — LIPID PANEL
Chol/HDL Ratio: 2.3 ratio (ref 0.0–4.4)
Cholesterol, Total: 136 mg/dL (ref 100–199)
HDL: 58 mg/dL (ref >39)
LDL Chol Calc (NIH): 40 mg/dL (ref 0–99)
Triglycerides: 248 mg/dL — ABNORMAL HIGH (ref 0–149)
VLDL Cholesterol Cal: 38 mg/dL (ref 5–40)

## 2023-02-17 ENCOUNTER — Other Ambulatory Visit: Payer: Self-pay

## 2023-02-17 MED ORDER — MONTELUKAST SODIUM 10 MG PO TABS: 10.0000 mg | ORAL_TABLET | Freq: Every day | ORAL | 3 refills | Status: DC

## 2023-02-17 MED ORDER — FLUTICASONE FUROATE-VILANTEROL 200-25 MCG/ACT IN AEPB: 1.0000 | INHALATION_SPRAY | Freq: Every day | RESPIRATORY_TRACT | 3 refills | Status: DC

## 2023-02-17 NOTE — Telephone Encounter (Signed)
Spoke with patient. Advised refills of Breo and Singular have been sent to pharmacy. NFN

## 2023-02-17 NOTE — Telephone Encounter (Signed)
Refills of Singular and Breo have been sent to pharmacy. NFN

## 2023-02-19 ENCOUNTER — Ambulatory Visit: Payer: Medicare Other | Admitting: Cardiology

## 2023-02-24 ENCOUNTER — Ambulatory Visit: Payer: Medicare Other | Admitting: Cardiology

## 2023-02-24 ENCOUNTER — Encounter: Payer: Self-pay | Admitting: Pulmonary Disease

## 2023-02-24 ENCOUNTER — Ambulatory Visit (INDEPENDENT_AMBULATORY_CARE_PROVIDER_SITE_OTHER): Payer: Medicare Other | Admitting: Pulmonary Disease

## 2023-02-24 ENCOUNTER — Encounter: Payer: Self-pay | Admitting: Cardiology

## 2023-02-24 VITALS — BP 124/62 | HR 74 | Temp 97.8°F | Ht 66.0 in | Wt 166.0 lb

## 2023-02-24 DIAGNOSIS — Z5181 Encounter for therapeutic drug level monitoring: Secondary | ICD-10-CM

## 2023-02-24 DIAGNOSIS — J84112 Idiopathic pulmonary fibrosis: Secondary | ICD-10-CM

## 2023-02-24 DIAGNOSIS — I2510 Atherosclerotic heart disease of native coronary artery without angina pectoris: Secondary | ICD-10-CM

## 2023-02-24 DIAGNOSIS — E782 Mixed hyperlipidemia: Secondary | ICD-10-CM

## 2023-02-24 MED ORDER — ROSUVASTATIN CALCIUM 40 MG PO TABS
40.0000 mg | ORAL_TABLET | Freq: Every day | ORAL | 3 refills | Status: AC
Start: 2023-02-24 — End: 2024-02-25

## 2023-02-24 MED ORDER — FENOFIBRATE 120 MG PO TABS: 120.0000 mg | ORAL_TABLET | Freq: Every day | ORAL | 3 refills | Status: DC

## 2023-02-24 NOTE — Addendum Note (Signed)
Addended by: Jacquiline Doe on: 02/24/2023 10:01 AM   Modules accepted: Orders

## 2023-02-24 NOTE — Progress Notes (Signed)
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  She is to follow with Dr. Leonor Liv at [facility] in [place] with CT scan showing UIP fibrosis.  She was started on Ofev 2020 however developed epistaxis requiring balloon.  Stop her Ofev and aspirin briefly, evaluated by ENT and initiated again with lower dose of Ofev at 100 twice daily around December 2020 which is then increased to 150 mg twice daily in April 2021 with no recurrence of epistaxis  History notable for carpal tunnel, trigger finger for which she had recent surgery on the right hand.  Evaluated 40 years ago for dry eyes and eye infection which is thought to be Sjogren's but negative on rheumatologic evaluation and ?  lip biopsy.  Complains of dyspnea on exertion which is chronic.  No cough, sputum production She has morning stiffness in the small joints of the hand and some difficulty swallowing.  Denies any Raynaud's symptoms, rash Also has daytime somnolence, fatigue in the morning, witnessed snoring and is requesting an evaluation for sleep apnea. Evaluated by Dr. Corliss Skains, rheumatology in July 24, 2021 who ordered AVISE labs with no evidence of autoimmune process.  Underwent high altitude testing at [facility] in summer of 2023 and was told that she needs supplemental oxygen on exertion at high altitude.  She was placed on supplemental oxygen as needed and went on a trip to [place] which was fabulous per the patient.  Ofev reduced to 100 mg twice daily in July 2023 as she was having diarrhea at the higher dose.  She has done well with this change with improvement in symptoms and  she is gaining weight  She contracted COVID-19 and given molnupiravir in early July 2023.   Contracted COVID again in early January 2024 and was given Paxlovid  Pets: Has dogs, no birds Occupation: Retired Emergency planning/management officer for [employer] Exposures: Had mold in the old house but moved out of it around 2019.  No ongoing exposures.  No down pillows or comforters Smoking history: 60 pack year smoker, quit in 1987 Travel history: Originally from [place]. No recent travel Relevant family history: Father died of lung cancer.  He was a smoker.  Interim history: She notes transient oxygen desats to mid 80s on her O2 ring which recovers within less than a minute. Otherwise she is stable with chronic stable dyspnea on exertion.  Outpatient Encounter Medications as of 02/24/2023  Medication Sig   albuterol (VENTOLIN HFA) 108 (90 Base) MCG/ACT inhaler Inhale 2 puffs into the lungs every 6 (six) hours as needed for wheezing or shortness of breath.   amLODipine (NORVASC) 10 MG tablet Take by mouth.   carvedilol (COREG) 12.5 MG tablet Take by mouth.   Cholecalciferol 50 MCG (2000 UT) CAPS Take by mouth.   fenofibrate (TRICOR) 48 MG tablet Take 1 tablet (48 mg total) by mouth daily.   ferrous sulfate 325 (65 FE) MG tablet Take by mouth.   fluticasone (CUTIVATE) 0.05 % cream SMARTSIG:Topical 1-2 Times Daily PRN   fluticasone furoate-vilanterol (BREO ELLIPTA) 200-25 MCG/ACT AEPB Inhale 1 puff into the lungs daily.   levothyroxine (SYNTHROID) 75 MCG tablet Take by  mouth.   liraglutide (VICTOZA) 18 MG/3ML SOPN INJECT 0.6 MG DAILY FOR 1 WEEK THEN 1.2 MG DAILY FOR 1 WEEK, THEN 1.8 MG DAILY FROM THEN ON   Magnesium 400 MG CAPS    metFORMIN (GLUCOPHAGE) 500 MG tablet Take 500 mg by mouth 3 (three) times daily.   mometasone (ELOCON) 0.1 % ointment Apply sparingly to affected area 3 times weekly as directed.   montelukast (SINGULAIR) 10 MG tablet Take 1 tablet (10 mg total) by mouth at bedtime.    Nintedanib (OFEV) 100 MG CAPS TAKE 1 CAPSULE BY MOUTH TWICE DAILY WITH FOOD.   omeprazole (PRILOSEC) 40 MG capsule Take by mouth.   rosuvastatin (CRESTOR) 40 MG tablet Take 1 tablet (40 mg total) by mouth daily.   sertraline (ZOLOFT) 50 MG tablet Take by mouth.   No facility-administered encounter medications on file as of 02/24/2023.   Physical Exam: Blood pressure 124/62, pulse 74, temperature 97.8 F (36.6 C), temperature source Oral, height 5\' 6"  (1.676 m), weight 166 lb (75.3 kg), SpO2 96%. Gen:      No acute distress HEENT:  EOMI, sclera anicteric Neck:     No masses; no thyromegaly Lungs:    Clear to auscultation bilaterally; normal respiratory effort CV:         Regular rate and rhythm; no murmurs Abd:      + bowel sounds; soft, non-tender; no palpable masses, no distension Ext:    No edema; adequate peripheral perfusion Skin:      Warm and dry; no rash Neuro: alert and oriented x 3 Psych: normal mood and affect   Data Reviewed: Imaging: CT chest 06/04/2018-chronic interstitial changes with septal thickening, volume loss and traction bronchiolectasis.    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  High-resolution CT 04/17/2021-mild pulmonary fibrosis and probable UIP pattern with progression compared to prior.  VQ scan 08/23/21-negative for pulmonary embolism  High-resolution CT 08/23/2022-unchanged pulmonary fibrosis which is stable compared to 2022. I have reviewed the images personally.  PFTs: 02/11/19 FVC 2.24 L (75%), FEV1 1.71 L (74%), FEV1/FVC-77% TLC-3.45 (66%) predicted, DLCO-12.3 (58%)  11/12/2019 FVC 2.24 [68%], FEV1 1.89 [76%], F/F 85, TLC 3.67 [69%], DLCO 14.56 [69%]  10/03/2020 FVC 2.19 [61%], FEV1 1.85 [95%], F/F 84, TLC 3.57 [5%], DLCO 14.17 [69%]  02/18/2022 FVC 2.19 [69%], FEV1 1.88 [79%], F/F 86, TLC 4.23 [79%], DLCO 12.36 [60%] Minimal restriction, moderate diffusion defect  6-minute walk test 04/26/2019- 1365  m  Labs: CTD serologies 11/01/2019 significant for ANA 1:40 nucleolar speckled  Hepatic panel 07/30/2022-normal  Sleep: Home study 11/17/2019-mild sleep apnea, AHI 10.8.  O2 desaturations to 83%.  09/15/2021-overnight on CPAP and 2 L oxygen Duration of study 5 hours 14 minutes Time spent less than 88% 0.4 minutes Nadir O2 sat of 86%   09/16/2021-overnight on room air Duration of study 10 hours 39 minutes Time spent less than 88% 98 minutes Nadir O2 sat of 79%  Assessment:  Pulmonary fibrosis High-res CT reviewed with probable UIP pattern pulmonary fibrosis. Continue Ofev.  We considered further work-up with biopsy but have deferred after discussion with patient. Serologies notable for borderline elevation in ANA.  This is likely nonspecific.  She has seen Dr. Corliss Skains from rheumatology with no evidence of autoimmune process  Continue on Ofev at a reduced dose.  She did not tolerate a higher dose due to diarrhea.   Comprehensive metabolic panel at her primary care office stable.  She will get repeat labs drawn next week during her  primary care visit.  Monitor transient drops in O2 sats.  She does have a portable concentrator at home which she can use CT reviewed shows stable pulmonary fibrosis  Mild sleep apnea Continue AutoSet CPAP.  Download reviewed with good compliance Download reviewed and with CPAP she has adequate oxygen at night and does not need supplemental oxygen.   Plan/Recommendations: Continue Ofev at 100 mg twice daily CPAP High-res CT, PFTs in 6 months.  Chilton Greathouse MD [facility] 02/24/2023, 9:45 AM  CC: Joneen Caraway Cromar*

## 2023-02-24 NOTE — Progress Notes (Signed)
Patient referred by Cyndi Lennert* for coronary artery calcification  Subjective:   Kathryn Scott, female    DOB: [birth year], 77 y.o.   MRN: [id number]   Chief Complaint  Patient presents with   Coronary artery calcification   Hyperlipidemia   Follow-up    HPI  77 y.o. Caucasian female with hypertension, controlled type II DM, family history of CAD, asthma, hyperlipidemia, allergies, IPF, coronary calcification  Patient still has occasional chest pain. Reviewed recent test results with the patient, details below.     Current Outpatient Medications:    albuterol (VENTOLIN HFA) 108 (90 Base) MCG/ACT inhaler, Inhale 2 puffs into the lungs every 6 (six) hours as needed for wheezing or shortness of breath., Disp: 8 g, Rfl: 6   amLODipine (NORVASC) 10 MG tablet, Take by mouth., Disp: , Rfl:    carvedilol (COREG) 12.5 MG tablet, Take by mouth., Disp: , Rfl:    Cholecalciferol 50 MCG (2000 UT) CAPS, Take by mouth., Disp: , Rfl:    fenofibrate (TRICOR) 48 MG tablet, Take 1 tablet (48 mg total) by mouth daily., Disp: 90 tablet, Rfl: 3   ferrous sulfate 325 (65 FE) MG tablet, Take by mouth., Disp: , Rfl:    fluticasone (CUTIVATE) 0.05 % cream, SMARTSIG:Topical 1-2 Times Daily PRN, Disp: , Rfl:    fluticasone furoate-vilanterol (BREO ELLIPTA) 200-25 MCG/ACT AEPB, Inhale 1 puff into the lungs daily., Disp: 180 each, Rfl: 3   levothyroxine (SYNTHROID) 75 MCG tablet, Take by mouth., Disp: , Rfl:    liraglutide (VICTOZA) 18 MG/3ML SOPN, INJECT 0.6 MG DAILY FOR 1 WEEK THEN 1.2 MG DAILY FOR 1 WEEK, THEN 1.8 MG DAILY FROM THEN ON, Disp: , Rfl:    Magnesium 400 MG CAPS, , Disp: , Rfl:    metFORMIN (GLUCOPHAGE) 500 MG tablet, Take 500 mg by mouth 3 (three) times daily., Disp: , Rfl:    mometasone (ELOCON) 0.1 % ointment, Apply sparingly to affected area 3 times weekly as directed., Disp: , Rfl:    montelukast (SINGULAIR) 10 MG tablet, Take 1 tablet (10 mg total) by mouth at bedtime.,  Disp: 90 tablet, Rfl: 3   Nintedanib (OFEV) 100 MG CAPS, TAKE 1 CAPSULE BY MOUTH TWICE DAILY WITH FOOD., Disp: 60 capsule, Rfl: 5   omeprazole (PRILOSEC) 40 MG capsule, Take by mouth., Disp: , Rfl:    rosuvastatin (CRESTOR) 40 MG tablet, Take 1 tablet (40 mg total) by mouth daily., Disp: 90 tablet, Rfl: 3   sertraline (ZOLOFT) 50 MG tablet, Take by mouth., Disp: , Rfl:   Cardiovascular and other pertinent studies:  EKG 11/20/2022: Sinus rhythm 79 bpm Occasional PAC    Lexiscan Tetrofosmin stress test 12/05/2022: Lexiscan nuclear stress test performed using 1-day protocol. Normal myocardial perfusion. Stress LVEF 73%. Low risk study.   CT Chest 04/17/2021: 1. Redemonstrated mild pulmonary fibrosis in a pattern with apical to basal gradient, featuring irregular peripheral interstitial opacity, traction bronchiectasis, and areas of subpleural bronchiolectasis without clear evidence of honeycombing. Fibrotic findings are slightly worsened compared to prior examination. Findings remain categorized as probable UIP per consensus guidelines: Diagnosis of Idiopathic Pulmonary Fibrosis: An Official ATS/ERS/JRS/ALAT Clinical Practice Guideline. Am Rosezetta Schlatter Crit Care Med Vol 198, Iss 5, [id number], Apr 05 2017. 2. Coronary artery disease.   Aortic Atherosclerosis (ICD10-I70.0).   Recent labs: 02/12/2023: Chol 136, TG 248, HDL 58, LDL 40  09/02/2022: Glucose 131, BUN/Cr 16/1.1. EGFR 53. Na/K 138/4.8. Rest of the CMP normal H/H 12/39. MCV 87. Platelets 394  HbA1C 6.1% Chol 114, TG 308, HDL 38, LDL 31  01/2022: TSH 3.8 normal   Review of Systems  Cardiovascular:  Negative for chest pain, dyspnea on exertion, leg swelling, palpitations and syncope.         Vitals:   02/24/23 1422  BP: 136/71  Pulse: 70  Resp: 16  SpO2: 96%      Objective:   Physical Exam Vitals and nursing note reviewed.  Constitutional:      General: She is not in acute distress. Neck:     Vascular: No  JVD.  Cardiovascular:     Rate and Rhythm: Normal rate and regular rhythm.     Heart sounds: Normal heart sounds. No murmur heard. Pulmonary:     Effort: Pulmonary effort is normal.     Breath sounds: Normal breath sounds. No wheezing or rales.  Musculoskeletal:     Right lower leg: No edema.     Left lower leg: No edema.    Visit diagnoses:   ICD-10-CM   1. Coronary artery calcification  I25.10 fenofibrate 120 MG TABS   I25.84 rosuvastatin (CRESTOR) 40 MG tablet    2. Mixed hyperlipidemia  E78.2 fenofibrate 120 MG TABS    Lipid panel           Assessment & Recommendations:    77 y.o. Caucasian female with hypertension, controlled type II DM, family history of CAD, asthma, hyperlipidemia, allergies, IPF, referred for evaluation of coronary artery disease  Chest pain: No ischemia on stress testing (12/2022). Likely noncardiac chest pain.  Mixed hyperlipidemia: TG improved on  Crestor 40 mg daily, Tricor 45 mg daily. Increase Tricor to 120 mg daily. Repeat lipid panel in 6 months.    F/u in 6 months   Elder Negus, MD Pager: [phone] Office: [phone]

## 2023-02-24 NOTE — Patient Instructions (Signed)
I am glad you are stable with your breathing Please get liver test at your primary care visit next week Will get a high-res CT and PFTs in 6 months Return to clinic after these tests

## 2023-03-03 ENCOUNTER — Other Ambulatory Visit: Payer: Self-pay | Admitting: Pulmonary Disease

## 2023-03-03 DIAGNOSIS — J84112 Idiopathic pulmonary fibrosis: Secondary | ICD-10-CM

## 2023-03-04 NOTE — Telephone Encounter (Signed)
Patient states she had labs compelted this morning with PCP at [facility]. Refill for Ofev pending LFT update  Chesley Mires, PharmD, MPH, BCPS, CPP Clinical Pharmacist (Rheumatology and Pulmonology)

## 2023-03-05 NOTE — Telephone Encounter (Signed)
CMET on 03/04/2023 - LFTs wnl  Refill for Ofev 100mg  twice daily sent to [facility].  Component Value Ref Range  Glucose 120 (H) 70 - 99 mg/dL  BUN 26 8 - 27 mg/dL  Creatinine 9.56 (H) 2.13 - 1.00 mg/dL  eGFR 53 (L) >08 MV/HQI/6.96  BUN/Creatinine Ratio 24 12 - 28  Sodium 141 134 - 144 mmol/L  Potassium 4.8 3.5 - 5.2 mmol/L  Chloride 103 96 - 106 mmol/L  CO2 21 20 - 29 mmol/L  CALCIUM 9.8 8.7 - 10.3 mg/dL  Total Protein 7.0 6.0 - 8.5 g/dL  Albumin, Serum 4.0 3.8 - 4.8 g/dL  Globulin, Total 3.0 1.5 - 4.5 g/dL  Total Bilirubin 0.2 0.0 - 1.2 mg/dL  Alkaline Phosphatase 82 44 - 121 IU/L  AST 24 0 - 40 IU/L  ALT (SGPT) 20    Nitesh Pitstick, PharmD, MPH, BCPS, CPP Clinical Pharmacist (Rheumatology and Pulmonology)

## 2023-03-26 ENCOUNTER — Ambulatory Visit: Payer: BLUE CROSS/BLUE SHIELD | Admitting: Pulmonary Disease

## 2023-07-25 ENCOUNTER — Ambulatory Visit (HOSPITAL_COMMUNITY)
Admission: RE | Admit: 2023-07-25 | Discharge: 2023-07-25 | Disposition: A | Discharge status: Home / Self Care | Payer: Medicare Other | Source: Ambulatory Visit | Attending: Pulmonary Disease | Admitting: Pulmonary Disease

## 2023-07-25 DIAGNOSIS — J84112 Idiopathic pulmonary fibrosis: Secondary | ICD-10-CM | POA: Insufficient documentation

## 2023-08-19 ENCOUNTER — Encounter: Payer: Self-pay | Admitting: Pulmonary Disease

## 2023-08-19 ENCOUNTER — Ambulatory Visit: Payer: Medicare Other | Admitting: Pulmonary Disease

## 2023-08-19 VITALS — BP 140/70 | HR 76 | Ht 66.0 in | Wt 170.0 lb

## 2023-08-19 DIAGNOSIS — G4733 Obstructive sleep apnea (adult) (pediatric): Secondary | ICD-10-CM | POA: Diagnosis not present

## 2023-08-19 DIAGNOSIS — R635 Abnormal weight gain: Secondary | ICD-10-CM

## 2023-08-19 DIAGNOSIS — J84112 Idiopathic pulmonary fibrosis: Secondary | ICD-10-CM

## 2023-08-19 DIAGNOSIS — Z5181 Encounter for therapeutic drug level monitoring: Secondary | ICD-10-CM

## 2023-08-19 LAB — PULMONARY FUNCTION TEST
DL/VA % pred: 85 %
DL/VA: 3.45 ml/min/mmHg/L
DLCO cor % pred: 50 %
DLCO cor: 10.25 ml/min/mmHg
DLCO unc % pred: 50 %
DLCO unc: 10.25 ml/min/mmHg
FEF 25-75 Post: 2.19 L/s
FEF 25-75 Pre: 2.07 L/s
FEF2575-%Change-Post: 5 %
FEF2575-%Pred-Post: 123 %
FEF2575-%Pred-Pre: 116 %
FEV1-%Change-Post: 1 %
FEV1-%Pred-Post: 74 %
FEV1-%Pred-Pre: 73 %
FEV1-Post: 1.71 L
FEV1-Pre: 1.69 L
FEV1FVC-%Change-Post: -1 %
FEV1FVC-%Pred-Pre: 111 %
FEV6-%Change-Post: 2 %
FEV6-%Pred-Post: 70 %
FEV6-%Pred-Pre: 69 %
FEV6-Post: 2.06 L
FEV6-Pre: 2.01 L
FEV6FVC-%Change-Post: 0 %
FEV6FVC-%Pred-Post: 104 %
FEV6FVC-%Pred-Pre: 104 %
FVC-%Change-Post: 2 %
FVC-%Pred-Post: 67 %
FVC-%Pred-Pre: 65 %
FVC-Post: 2.07 L
FVC-Pre: 2.02 L
Post FEV1/FVC ratio: 82 %
Post FEV6/FVC ratio: 99 %
Pre FEV1/FVC ratio: 84 %
Pre FEV6/FVC Ratio: 100 %
RV % pred: 62 %
RV: 1.49 L
TLC % pred: 64 %
TLC: 3.45 L

## 2023-08-19 NOTE — Progress Notes (Addendum)
 Kathryn Scott    [id number]    1945-12-02  Primary Care Physician:Burgess, [name], MD  Referring Physician: Carvin [name], MD [address] [place],  [place] [address]  Chief complaint:  Follow-up for for IPF on Ofev   HPI: Past medical history of hypertension, asthma, hyperlipidemia, allergies, IPF  Diagnosed with IPF in 2017.  She is to follow with Dr. Albina Aliment at [facility] in [place] with CT scan showing UIP fibrosis.  She was started on Ofev  2020 however developed epistaxis requiring balloon.  Stop her Ofev  and aspirin briefly, evaluated by ENT and initiated again with lower dose of Ofev  at 100 twice daily around December 2020 which is then increased to 150 mg twice daily in April 2021 with no recurrence of epistaxis  History notable for carpal tunnel, trigger finger for which she had recent surgery on the right hand.  Evaluated 40 years ago for dry eyes and eye infection which is thought to be Sjogren's but negative on rheumatologic evaluation and ?  lip biopsy.  Complains of dyspnea on exertion which is chronic.  No cough, sputum production She has morning stiffness in the small joints of the hand and some difficulty swallowing.  Denies any Raynaud's symptoms, rash Also has daytime somnolence, fatigue in the morning, witnessed snoring and is requesting an evaluation for sleep apnea. Evaluated by Dr. Dolphus, rheumatology in July 24, 2021 who ordered AVISE labs with no evidence of autoimmune process.  Underwent high altitude testing at [facility] in summer of 2023 and was told that she needs supplemental oxygen on exertion at high altitude.  She was placed on supplemental oxygen as needed and went on a trip to [place]  which was fabulous per the patient.  Ofev  reduced to 100 mg twice daily in July 2023 as she was having diarrhea at the higher dose.  She has done well with this change with improvement in symptoms and  she is gaining weight  She contracted COVID-19 and given molnupiravir  in early July 2023.   Contracted COVID again in early January 2024 and was given Paxlovid   Pets: Has dogs, no birds Occupation: Retired emergency planning/management officer for [employer] Exposures: Had mold in the old house but moved out of it around 2019.  No ongoing exposures.  No down pillows or comforters Smoking history: 60 pack year smoker, quit in 1987 Travel history: Originally from [place] . No recent travel Relevant family history: Father died of lung cancer.  He was a smoker.  Interim history: Discussed the use of AI scribe software for clinical note transcription with the patient, who gave verbal consent to proceed.  The patient, with a history of idiopathic pulmonary fibrosis (IPF) and on Ofev , presents with concerns about recent CT scan results. She reports reading that the scan was indicative of pulmonary arterial hypertension (PAH) and that her heart and pulmonary trunk were enlarged. She also expresses concern about her family history of heart disease.  The patient reports that her breathing has been stable on Ofev , but she has noticed increased shortness of breath, particularly when exerting herself, such as when preparing Thanksgiving dinner. She also reports a steady weight gain over the past year, particularly in her midsection, which she believes may be affecting her breathing.  The patient also mentions that she has an upcoming appointment with her cardiologist and a primary care physician for an annual check-up. She has not had any labs drawn since July.  Outpatient Encounter Medications as of 08/19/2023  Medication Sig   albuterol  (VENTOLIN  HFA) 108 (90 Base) MCG/ACT inhaler Inhale 2 puffs into the lungs every 6 (six) hours as needed for wheezing or shortness of breath.   amLODipine  (NORVASC ) 10 MG tablet Take by mouth.   carvedilol  (COREG ) 12.5 MG tablet Take 12.5 mg by mouth 2 (two) times daily with a meal.    Cholecalciferol 50 MCG (2000 UT) CAPS Take by mouth.   fenofibrate  120 MG TABS Take 1 tablet (120 mg total) by mouth daily.   ferrous sulfate 325 (65 FE) MG tablet Take by mouth.   fluticasone  furoate-vilanterol (BREO ELLIPTA ) 200-25 MCG/ACT AEPB Inhale 1 puff into the lungs daily.   levothyroxine  (SYNTHROID ) 75 MCG tablet Take by mouth.   liraglutide (VICTOZA) 18 MG/3ML SOPN INJECT 0.6 MG DAILY FOR 1 WEEK THEN 1.2 MG DAILY FOR 1 WEEK, THEN 1.8 MG DAILY FROM THEN ON   Magnesium  400 MG CAPS    metFORMIN (GLUCOPHAGE) 500 MG tablet Take 500 mg by mouth 3 (three) times daily.   mometasone (ELOCON) 0.1 % ointment Apply sparingly to affected area 3 times weekly as directed.   montelukast  (SINGULAIR ) 10 MG tablet Take 1 tablet (10 mg total) by mouth at bedtime.   Nintedanib  (OFEV ) 100 MG CAPS TAKE 1 CAPSULE BY MOUTH TWICE DAILY WITH FOOD.   omeprazole (PRILOSEC) 40 MG capsule Take by mouth.   rosuvastatin  (CRESTOR ) 40 MG tablet Take 1 tablet (40 mg total) by mouth daily.   sertraline  (ZOLOFT ) 50 MG tablet Take by mouth.   [DISCONTINUED] fluticasone  (CUTIVATE) 0.05 % cream SMARTSIG:Topical 1-2 Times Daily PRN   No facility-administered encounter medications on file as of 08/19/2023.   Physical Exam: Blood pressure (!) 140/70, pulse 76, height 5' 6 (1.676 m), weight 170 lb (77.1 kg), SpO2 97%. Gen:      No acute distress HEENT:  EOMI, sclera anicteric Neck:     No masses; no thyromegaly Lungs:    Clear to auscultation bilaterally; normal respiratory effort CV:         Regular rate and rhythm; no murmurs Abd:      + bowel sounds; soft, non-tender; no palpable masses, no distension Ext:    No edema; adequate peripheral perfusion Skin:      Warm and dry; no rash Neuro: alert and oriented x 3 Psych: normal mood and affect   Data Reviewed: Imaging: CT chest 06/04/2018-chronic interstitial changes with septal thickening, volume loss and traction bronchiolectasis.    High-resolution CT  11/01/2019-pulmonary fibrosis in probable UIP pattern.  High-resolution CT 04/17/2021-mild pulmonary fibrosis and probable UIP pattern with progression compared to prior.  VQ scan 08/23/21-negative for pulmonary embolism  High-resolution CT 08/23/2022-unchanged pulmonary fibrosis which is stable compared to 2022.  High resolution CT 07/25/2023-stable pattern of pulmonary fibrosis, enlarged PA I have reviewed the images personally.  PFTs: 02/11/19 FVC 2.24 L (75%), FEV1 1.71 L (74%), FEV1/FVC-77% TLC-3.45 (66%) predicted, DLCO-12.3 (58%)  11/12/2019 FVC 2.24 [68%], FEV1 1.89 [76%], F/F 85, TLC 3.67 [69%], DLCO 14.56 [69%]  10/03/2020 FVC 2.19 [61%], FEV1 1.85 [95%], F/F 84, TLC 3.57 [5%], DLCO 14.17 [69%]  02/18/2022 FVC 2.19 [69%], FEV1 1.88 [79%], F/F 86, TLC 4.23 [79%], DLCO 12.36 [60%] Minimal restriction, moderate diffusion defect  08/19/2023 FVC 2.07 [67%], FEV1 1.71 [74%], F/F82, TLC 3.45 [64%], DLCO 10.25 [50%] Mild restriction, severe diffusion defect  6-minute walk test 04/26/2019- 1365 m  Labs: CTD serologies 11/01/2019 significant for ANA 1:40 nucleolar speckled  Hepatic panel 07/30/2022-normal  Sleep: Home study 11/17/2019-mild sleep apnea, AHI 10.8.  O2 desaturations to 83%.  09/15/2021-overnight on CPAP and 2 L oxygen Duration of study 5 hours 14 minutes Time spent less than 88% 0.4 minutes Nadir O2 sat of 86%   09/16/2021-overnight on room air Duration of study 10 hours 39 minutes Time spent less than 88% 98 minutes Nadir O2 sat of 79%  Assessment:  Pulmonary fibrosis High-res CT reviewed with probable UIP pattern pulmonary fibrosis. Continue Ofev .  We considered further work-up with biopsy but have deferred after discussion with patient. Serologies notable for borderline elevation in ANA.  This is likely nonspecific.  She has seen Dr. Dolphus from rheumatology with no evidence of autoimmune process  Continue on Ofev  at a reduced dose.  She did not tolerate a  higher dose due to diarrhea.   Comprehensive metabolic panel at her primary care office stable.  She will get repeat labs drawn next month during her primary care visit.  Monitor transient drops in O2 sats.  She does have a portable concentrator at home which she can use  Mild sleep apnea Continue AutoSet CPAP.  Download reviewed with good compliance Download reviewed and with CPAP she has adequate oxygen at night and does not need supplemental oxygen.   Possible Pulmonary Hypertension (PH) CT scan showed enlarged pulmonary artery, indicative of possible PAH.  PFTs showed drop in diffusion capacity which may be be due to pulmonary vascular disease versus progression of pulmonary fibrosis.  Discussed the need for further testing to confirm diagnosis.  Weight Gain Patient reports steady weight gain over the past year, particularly in midsection. Discussed potential impact on breathing. -Encourage weight management strategies to reduce impact on breathing.  Follow-up in 6 months. Labs to be drawn during upcoming primary care visit in February.        Plan/Recommendations: Continue Ofev  at 100 mg twice daily CPAP Monitor labs Coordinate echocardiogram Follow-up in 6 months  Lonna Coder MD [facility] 08/19/2023, 2:42 PM  CC: Carvin Fallow Cromar*

## 2023-08-19 NOTE — Progress Notes (Signed)
 Full PFT performed today.

## 2023-08-19 NOTE — Patient Instructions (Signed)
 Full PFT performed today.

## 2023-08-19 NOTE — Patient Instructions (Signed)
 VISIT SUMMARY:  During today's visit, we discussed your recent CT scan results and your concerns about possible pulmonary arterial hypertension (PAH). We also reviewed your current treatment for idiopathic pulmonary fibrosis (IPF) and addressed your recent weight gain and its potential impact on your breathing.  YOUR PLAN:  -POSSIBLE PULMONARYHYPERTENSION (PH): Pulmonary hypertension is a condition where the blood pressure in the arteries of the lungs is higher than normal. We need to confirm this diagnosis with further testing. We will order an echocardiogram to evaluate your heart and pulmonary arteries more closely and coordinate with your cardiologist for this test.  -IDIOPATHIC PULMONARY FIBROSIS (IPF): Idiopathic pulmonary fibrosis is a lung disease that causes scarring of the lungs for an unknown reason. Your condition appears stable on Ofev , but you have reported increased shortness of breath. We will continue your current medication and closely monitor your symptoms and lung function.  -WEIGHT GAIN: You have reported a steady weight gain over the past year, especially around your midsection, which may be affecting your breathing. We discussed strategies for managing your weight to help reduce this impact.  INSTRUCTIONS:  Please follow up in 6 months. Additionally, ensure that you have your labs drawn during your upcoming primary care visit in February.

## 2023-08-20 ENCOUNTER — Ambulatory Visit: Payer: BLUE CROSS/BLUE SHIELD | Admitting: Pulmonary Disease

## 2023-08-21 ENCOUNTER — Ambulatory Visit: Payer: BLUE CROSS/BLUE SHIELD | Admitting: Pulmonary Disease

## 2023-08-21 ENCOUNTER — Other Ambulatory Visit: Payer: Self-pay | Admitting: Pulmonary Disease

## 2023-08-21 ENCOUNTER — Telehealth: Payer: Self-pay | Admitting: *Deleted

## 2023-08-21 DIAGNOSIS — J84112 Idiopathic pulmonary fibrosis: Secondary | ICD-10-CM

## 2023-08-21 DIAGNOSIS — I272 Pulmonary hypertension, unspecified: Secondary | ICD-10-CM

## 2023-08-21 DIAGNOSIS — R0609 Other forms of dyspnea: Secondary | ICD-10-CM

## 2023-08-21 NOTE — Telephone Encounter (Signed)
Pts echo is scheduled for 08/25/23 at 0930.  She will see Dr. Rosemary Holms on 08/27/23 as an OV.   Pt made aware of appt date and time by Echo Scheduler.

## 2023-08-21 NOTE — Telephone Encounter (Signed)
Cherylann Banas, RN  P [facility] Cc: [name], LPN Please see Dr. Damian Leavell questions below. Thanks!       Previous Messages    ----- Message ----- From: Kathryn Negus, MD Sent: 08/20/2023  11:24 AM EST To: Kathryn Greathouse, MD; C[facility] Triage  Absolutely.  Staff, any chance we could get echocardiogram before her visit with me on 1/22? If not, okay to get it after visit-sooner the better.  Diagnosis: Pulmonary hypertension, other secondary                 Exertional dyspnea  Thanks MJP ----- Message ----- From: Kathryn Greathouse, MD Sent: 08/19/2023   2:56 PM EST To: Kathryn Negus, MD  She is coming to see you later this month.  Do think we can get an echocardiogram to evaluate pulmonary hypertension?.  Her recent CT shows enlarged PA and I want to evaluate her for group 3 pH.  Thank you.

## 2023-08-21 NOTE — Telephone Encounter (Signed)
Order for echo placed with comment noted in the order for scheduling team to arrange prior to the pts office visit appt with Dr. Rosemary Holms.   Will send staff message now to Echo Scheduler to arrange this appt prior to the pt seeing Dr. Rosemary Holms on 08/27/23.

## 2023-08-25 ENCOUNTER — Ambulatory Visit (HOSPITAL_COMMUNITY): Payer: Medicare Other | Attending: Cardiology

## 2023-08-25 ENCOUNTER — Other Ambulatory Visit (HOSPITAL_COMMUNITY): Payer: BLUE CROSS/BLUE SHIELD

## 2023-08-25 DIAGNOSIS — I272 Pulmonary hypertension, unspecified: Secondary | ICD-10-CM

## 2023-08-25 DIAGNOSIS — R0609 Other forms of dyspnea: Secondary | ICD-10-CM

## 2023-08-25 LAB — ECHOCARDIOGRAM COMPLETE
Area-P 1/2: 3.37 cm2
P 1/2 time: 365 ms
S' Lateral: 2.7 cm

## 2023-08-27 ENCOUNTER — Encounter: Payer: Self-pay | Admitting: Cardiology

## 2023-08-27 ENCOUNTER — Ambulatory Visit: Payer: Medicare Other | Attending: Cardiology | Admitting: Cardiology

## 2023-08-27 VITALS — BP 140/80 | HR 79 | Ht 66.0 in | Wt 170.6 lb

## 2023-08-27 DIAGNOSIS — E781 Pure hyperglyceridemia: Secondary | ICD-10-CM | POA: Diagnosis not present

## 2023-08-27 DIAGNOSIS — I2510 Atherosclerotic heart disease of native coronary artery without angina pectoris: Secondary | ICD-10-CM | POA: Insufficient documentation

## 2023-08-27 DIAGNOSIS — E782 Mixed hyperlipidemia: Secondary | ICD-10-CM | POA: Diagnosis present

## 2023-08-27 NOTE — Progress Notes (Signed)
  Cardiology Office Note:  .   Date:  08/27/2023  ID:  Kathryn Scott, DOB 1945-12-16, MRN [id number] PCP: Kathryn Burkitt, MD  [facility] Providers Cardiologist:  Kathryn Mainland, MD PCP: Kathryn Burkitt, MD  Chief Complaint  Patient presents with   Mixed hyperlipidemia      History of Present Illness: Kathryn Scott is a 78 y.o. female with hypertension, controlled type II DM, asthma, hyperlipidemia, allergies, IPF, coronary calcification, family history of CAD,  Patient has relatively unchanged exertional dyspnea.  She follows up with Dr. Isaiah Scott regularly for IPF.  There was concern raised if she has pulmonary hypertension.  Patient's recent echocardiogram, detailed below, did not show pulmonary hypertension.  Blood pressure is elevated today, generally lower at home.  Also reviewed recent lipid panel results, details below.    Vitals:   08/27/23 1457  BP: (!) 140/80  Pulse: 79  SpO2: 95%     ROS:  Review of Systems  Cardiovascular:  Positive for dyspnea on exertion. Negative for chest pain, leg swelling, palpitations and syncope.     Studies Reviewed: Marland Kitchen        Independently interpreted 02/2023: Chol 136, TG 248, HDL 58, LDL 40  10/2021: Chol 121, TG 272, HDL 40, LDL 40  Echocardiogram 08/25/2023: EF 60-65%. Grade 1 diastolic dysfunction. GLS -25.9%. Mild to mod AI. Estimated PASP 21 mmHg. No evidence of pulmonary hypertension.    Physical Exam:   Physical Exam Vitals and nursing note reviewed.  Constitutional:      General: She is not in acute distress. Neck:     Vascular: No JVD.  Cardiovascular:     Rate and Rhythm: Normal rate and regular rhythm.     Heart sounds: Normal heart sounds. No murmur heard. Pulmonary:     Effort: Pulmonary effort is normal.     Breath sounds: Normal breath sounds. No wheezing or rales.  Musculoskeletal:     Right lower leg: No edema.     Left lower leg: No edema.      VISIT  DIAGNOSES:   ICD-10-CM   1. Mixed hyperlipidemia  E78.2 AMB Referral to [facility] Pharm-D    2. Coronary artery disease involving native coronary artery of native heart without angina pectoris  I25.10 AMB Referral to [facility] Pharm-D    3. Hypertriglyceridemia  E78.1 AMB Referral to [facility] Pharm[facility]D       ASSESSMENT AND PLAN: .    Kathryn Scott is a 78 y.o. female with hypertension, controlled type II DM, asthma, hyperlipidemia, allergies, IPF, coronary calcification, family history of CAD,   Mixed hyperlipidemia: LDL well-controlled, but triglyceride remains elevated at 248 on Crestor 40 mg daily, Tricor 120 mg daily. Known coronary calcification Will refer to lipid clinic.  IPF: Personally reviewed and independently interpreted echocardiogram 08/2023. No evidence of pulmonary hypertension noted. I will share this information with Dr. Isaiah Scott. We can repeat echocardiogram or consider right heart catheterization in future, if there is any clinical worsening to suggest pulmonary hypertension.   Hypertension: I could be component of whitecoat hypertension. Encourage patient to keep a log of blood pressure readings and take it to her PCP visit next month. If blood pressure remains elevated within send 140/85 mmHg, consider addition of ARB/ACEi.      F/u in 6 months  Signed, Elder Negus, MD

## 2023-08-27 NOTE — Patient Instructions (Signed)
Medication Instructions:  You have been referred to our Pharm D clinical for lipid management. *If you need a refill on your cardiac medications before your next appointment, please call your pharmacy*   Follow-Up: At [facility], you and your health needs are our priority.  As part of our continuing mission to provide you with exceptional heart care, we have created designated Provider Care Teams.  These Care Teams include your primary Cardiologist (physician) and Advanced Practice Providers (APPs -  Physician Assistants and Nurse Practitioners) who all work together to provide you with the care you need, when you need it.  We recommend signing up for the patient portal called "MyChart".  Sign up information is provided on this After Visit Summary.  MyChart is used to connect with patients for Virtual Visits (Telemedicine).  Patients are able to view lab/test results, encounter notes, upcoming appointments, etc.  Non-urgent messages can be sent to your provider as well.   To learn more about what you can do with MyChart, go to [email or web address].    Your next appointment:   6 month(s)  Provider:   Dr. Rosemary Holms  Other Instructions   1st Floor: - Lobby - Registration  - Pharmacy  - Lab - Cafe  2nd Floor: - PV Lab - Diagnostic Testing (echo, CT, nuclear med)  3rd Floor: - Vacant  4th Floor: - TCTS (cardiothoracic surgery) - AFib Clinic - Structural Heart Clinic - Vascular Surgery  - Vascular Ultrasound  5th Floor: - HeartCare Cardiology (general and EP) - Clinical Pharmacy for coumadin, hypertension, lipid, weight-loss medications, and med management appointments    Valet parking services will be available as well.

## 2023-08-29 ENCOUNTER — Ambulatory Visit: Payer: Medicare Other

## 2023-09-01 NOTE — Telephone Encounter (Signed)
Refill sent for OFEV to [facility] (pulmonary fibrosis team): [phone]  Dose: 100mg  twice daily  Last OV: 08/19/2023 Provider: Dr. Isaiah Serge Pertinent labs: LFTs on 03/04/23; per most recent note, patient has appt with PCP in Feb 2025  Next OV: 6 months (not yet scheduled)  Chesley Mires, PharmD, MPH, BCPS Clinical Pharmacist (Rheumatology and Pulmonology)

## 2023-09-05 ENCOUNTER — Ambulatory Visit: Payer: Medicare Other | Attending: Cardiology | Admitting: Pharmacist

## 2023-09-05 ENCOUNTER — Telehealth: Payer: Self-pay | Admitting: Pharmacy Technician

## 2023-09-05 ENCOUNTER — Other Ambulatory Visit (HOSPITAL_COMMUNITY): Payer: Self-pay

## 2023-09-05 DIAGNOSIS — E781 Pure hyperglyceridemia: Secondary | ICD-10-CM | POA: Diagnosis present

## 2023-09-05 NOTE — Telephone Encounter (Signed)
Pharmacy Patient Advocate Encounter   Received notification from  cc'd charts  that prior authorization for VASCEPA is required/requested.   Insurance verification completed.   The patient is insured through  [facility] MEDICARE  .   Per test claim: The current 30 day co-pay is, $0.00.  No PA needed at this time. This test claim was processed through [facility]- copay amounts may vary at other pharmacies due to pharmacy/plan contracts, or as the patient moves through the different stages of their insurance plan.      THE GENERIC REJECTED STATING NEEDS A PA

## 2023-09-05 NOTE — Assessment & Plan Note (Signed)
Assessment: LDL-C is well-controlled at 40 Triglycerides elevated chronically last from July to 48 On rosuvastatin 40 mg and fenofibrate 120 Discussed the benefits of Vascepa As long as insurance covers Vascepa or generic cost should not be an issue because she is already had the 2000 our out-of-pocket max  Plan: Submit prior authorization for Vascepa 2 g twice daily Continue rosuvastatin 40 mg daily and fenofibrate 120 mg daily She will get labs at PCP

## 2023-09-05 NOTE — Progress Notes (Signed)
Patient ID: Kathryn Scott                 DOB: Dec 21, 1945                    MRN: [id number]      HPI: Jeraldean Wechter is a 78 y.o. female patient referred to lipid clinic by Dr. Joaquim Nam. PMH is significant for hypertension, controlled type II DM, asthma, hyperlipidemia, allergies, IPF, coronary calcification, family history of CAD.  LDL-C well-controlled but triglycerides elevated therefore patient referred to lipid clinic.  Patient presents to lipid clinic today.  She states her last A1c was 6.1, on Mounjaro.  Blood sugar well-controlled.  Does not do a lot of exercise due to pulmonary fibrosis and getting short of breath very quickly.  She does have oxygen that she uses as needed if her oxygen drops.  Diet low in added sugars and saturated fats.  We discussed the reduce it trial and the potential benefits of Vascepa.  She has already had her $2000 out-of-pocket max for this year therefore cost as long as insurance will approve prior authorization it is not a concern.  Current Medications: Rosuvastatin 40 mg daily, fenofibrate 120 mg daily Intolerances:  Risk Factors: Hypertension, age, diabetes LDL-C goal:  ApoB goal:   Diet: trying to eat more fish Breakfast:  1-2 meals per week of fish Chicken  Rare fried foods Mostly olive oil 2% milk lactaid w/ cereal Low fat yogurt w/ fruit Broccoli, green beans, corn, sweet potato, salads Drinks: decaf unsweet tea w/ splenda, 1 cup coffee w/ splenda, diet soda  Exercise: limited by pulmonary fibrosis  Family History:  Family History  Problem Relation Age of Onset   Stroke Mother    Stroke Father    Heart disease Father    Lung cancer Father    Heart attack Father    Heart attack Brother    Heart disease Brother    Diabetes Son    Stroke Son    Diabetes Son    Diabetes Daughter    Psoriasis Daughter    Allergies Daughter      Social History: ETOH once a month, no tobacco  Labs: Lipid Panel     Component Value Date/Time    CHOL 136 02/12/2023 1048   TRIG 248 (H) 02/12/2023 1048   HDL 58 02/12/2023 1048   CHOLHDL 2.3 02/12/2023 1048   LDLCALC 40 02/12/2023 1048   LABVLDL 38 02/12/2023 1048    Past Medical History:  Diagnosis Date   Asthma    Diabetes mellitus without complication (HCC)    Hypertension     Current Outpatient Medications on File Prior to Visit  Medication Sig Dispense Refill   amLODipine (NORVASC) 10 MG tablet Take by mouth.     carvedilol (COREG) 12.5 MG tablet Take 12.5 mg by mouth 2 (two) times daily with a meal.     Cholecalciferol 50 MCG (2000 UT) CAPS Take by mouth.     fenofibrate 120 MG TABS Take 1 tablet (120 mg total) by mouth daily. 90 tablet 3   ferrous sulfate 325 (65 FE) MG tablet Take by mouth.     fluticasone furoate-vilanterol (BREO ELLIPTA) 200-25 MCG/ACT AEPB Inhale 1 puff into the lungs daily. 180 each 3   levothyroxine (SYNTHROID) 75 MCG tablet Take by mouth.     Magnesium 400 MG CAPS      metFORMIN (GLUCOPHAGE) 500 MG tablet Take 500 mg by mouth daily with breakfast.  mometasone (ELOCON) 0.1 % ointment Apply sparingly to affected area 3 times weekly as directed.     montelukast (SINGULAIR) 10 MG tablet Take 1 tablet (10 mg total) by mouth at bedtime. 90 tablet 3   MOUNJARO 5 MG/0.5ML Pen Inject 5 mg into the skin once a week.     OFEV 100 MG CAPS TAKE 1 CAPSULE BY MOUTH 2 TIMES A DAY WITH FOOD. 60 capsule 1   omeprazole (PRILOSEC) 40 MG capsule Take by mouth.     sertraline (ZOLOFT) 50 MG tablet Take by mouth.     albuterol (VENTOLIN HFA) 108 (90 Base) MCG/ACT inhaler Inhale 2 puffs into the lungs every 6 (six) hours as needed for wheezing or shortness of breath. 8 g 6   rosuvastatin (CRESTOR) 40 MG tablet Take 1 tablet (40 mg total) by mouth daily. 90 tablet 3   No current facility-administered medications on file prior to visit.    Allergies  Allergen Reactions   Clindamycin/Lincomycin    Lisinopril     Swelling/systemic reaction.   Penicillins      hives   Sulfa Antibiotics     Red/itching    Assessment/Plan:  1. Hyperlipidemia -  Hypertriglyceridemia Assessment: LDL-C is well-controlled at 40 Triglycerides elevated chronically last from July to 48 On rosuvastatin 40 mg and fenofibrate 120 Discussed the benefits of Vascepa As long as insurance covers Vascepa or generic cost should not be an issue because she is already had the 2000 our out-of-pocket max  Plan: Submit prior authorization for Vascepa 2 g twice daily Continue rosuvastatin 40 mg daily and fenofibrate 120 mg daily She will get labs at PCP    Thank you,  Olene Floss, Pharm.D, BCACP, CPP [facility] of [facility] [address] [place], [place] [address]  Phone: [phone]; Fax: [phone]

## 2023-09-05 NOTE — Patient Instructions (Addendum)
We will look into coverage of Vascepa I will let you know if there are any clinical trials that you may qualify for. These wont open until the spring or summer

## 2023-09-08 MED ORDER — VASCEPA 1 G PO CAPS: 2.0000 g | ORAL_CAPSULE | Freq: Two times a day (BID) | ORAL | 3 refills | Status: DC

## 2023-09-08 NOTE — Addendum Note (Signed)
Addended by: Malena Peer D on: 09/08/2023 03:56 PM   Modules accepted: Orders

## 2023-09-08 NOTE — Telephone Encounter (Signed)
Spoke with patient and made her aware of approval. Rx with DAW sent to [facility]

## 2023-09-08 NOTE — Telephone Encounter (Signed)
Called pt to let her know Vascepa covered. Will need to confirm what pharmacy she wants it sent to. LVM

## 2023-09-08 NOTE — Addendum Note (Signed)
Addended by: Malena Peer D on: 09/08/2023 04:16 PM   Modules accepted: Orders

## 2023-10-27 ENCOUNTER — Other Ambulatory Visit: Payer: Self-pay | Admitting: Pulmonary Disease

## 2023-10-27 DIAGNOSIS — J84112 Idiopathic pulmonary fibrosis: Secondary | ICD-10-CM

## 2023-10-28 ENCOUNTER — Other Ambulatory Visit: Payer: Self-pay

## 2023-12-01 IMAGING — NM NM PULMONARY PERF PARTICULATE
8 series · 8 of 8 positions shown · non-contrast
Comparison: None.

CLINICAL DATA: Same day chest x-ray

EXAM:
NUCLEAR MEDICINE PERFUSION LUNG SCAN
TECHNIQUE: Perfusion images were obtained in multiple projections after
intravenous injection of radiopharmaceutical.
Ventilation scans intentionally deferred if perfusion scan and chest
x-ray adequate for interpretation during COVID 19 epidemic.
RADIOPHARMACEUTICALS:  4.2 mCi Sc-WWm MAA IV

[Series 1: ant/post perf · 4.14mm/px · 1 of 1 slices shown (1 of 2)]
[im 1/1]
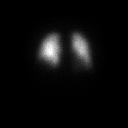

[Series 1: ant/post perf · 4.14mm/px · 1 of 1 slices shown (2 of 2)]
[im 1/1]
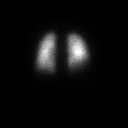

[Series 2: lao/rpo perf · 4.14mm/px · 1 of 1 slices shown (1 of 2)]
[im 1/1]
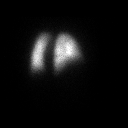

[Series 2: lao/rpo perf · 4.14mm/px · 1 of 1 slices shown (2 of 2)]
[im 1/1]
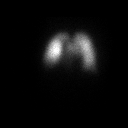

[Series 3: lpo/rao perf · 4.14mm/px · 1 of 1 slices shown (1 of 2)]
[im 1/1]
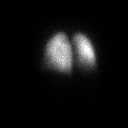

[Series 3: lpo/rao perf · 4.14mm/px · 1 of 1 slices shown (2 of 2)]
[im 1/1]
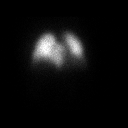

[Series 4: lt lat/rt lat perf · 4.14mm/px · 1 of 1 slices shown (1 of 2)]
[im 1/1]
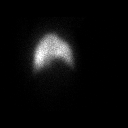

[Series 4: lt lat/rt lat perf · 4.14mm/px · 1 of 1 slices shown (2 of 2)]
[im 1/1]
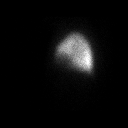

[8 of 8 positions shown; findings below may reference images not displayed]

FINDINGS: No perfusion defects.
IMPRESSION: Pulmonary embolism absent.

## 2023-12-26 ENCOUNTER — Ambulatory Visit: Payer: Self-pay | Admitting: Pulmonary Disease

## 2023-12-26 NOTE — Telephone Encounter (Signed)
 E2C2 Pulmonary Triage - Initial Assessment Questions "Chief Complaint (e.g., cough, sob, wheezing, fever, chills, sweat or additional symptoms) *Go to specific symptom protocol after initial questions. Patient reports she developed cold symptoms about two weeks ago-cough, sore throat, head and chest congestion. Patient endorses her baseline shortness of breath with activity. Patient reports having taken Coricidin HBP Cold and cough until cold symptoms subsided. Only symptom she currently has is cough and chest congestion. Patient reports utilizing Mucinex DM with little improvement. Patient calling primarily to see if there is a different OTC medication that she can try. Patient is given the suggestion of Coricidin HBP chest congestion and cough. Patient's care advise including plenty of fluids including warm liquids. Humidifier usage along with cough drops to help soothe throat. Patient is recommended to Urgent Care if symptoms become worse over the weekend. Patient verbalized understanding and all questions answered.   "How long have symptoms been present?" Started two weeks ago  Have you tested for COVID or Flu? Note: If not, ask patient if a home test can be taken. If so, instruct patient to call back for positive results. No  MEDICINES:   "Have you used any OTC meds to help with symptoms?" Yes If yes, ask "What medications?" Coricidin HBP Cold and Cough Mucinex  "Have you used your inhalers/maintenance medication?" Yes If yes, "What medications?" Albuterol  Breo Ellipta   If inhaler, ask "How many puffs and how often?" Note: Review instructions on medication in the chart. Albuterol    OXYGEN: "Do you wear supplemental oxygen?" No If yes, "How many liters are you supposed to use?"   "Do you monitor your oxygen levels?" Yes If yes, "What is your reading (oxygen level) today?" 94-95%  "What is your usual oxygen saturation reading?"  (Note: Pulmonary O2 sats should be 90% or  greater) 94-95%   Copied from CRM [id number]. Topic: Clinical - Red Word Triage >> Dec 26, 2023  3:59 PM Justina Oman C wrote: Red Word that prompted transfer to Nurse Triage: Patient [phone] came down with a cold a few weeks ago, still having chest congestion, coughing phlegm pale yellow, shortness of breath, wheezing and taking Mucinex DM now. Patient denies pain, fever, headaches, nor dizziness. Patient is asking if she can take something better over the counter. Please advise. Reason for Disposition  Cough has been present for > 3 weeks  Answer Assessment - Initial Assessment Questions 1. ONSET: "When did the cough begin?"      Over 2 weeks ago 2. SEVERITY: "How bad is the cough today?"      Moderate to Severe 3. SPUTUM: "Describe the color of your sputum" (none, dry cough; clear, white, yellow, green)     Pale yellow at times 4. HEMOPTYSIS: "Are you coughing up any blood?" If so ask: "How much?" (flecks, streaks, tablespoons, etc.)     no 5. DIFFICULTY BREATHING: "Are you having difficulty breathing?" If Yes, ask: "How bad is it?" (e.g., mild, moderate, severe)    - MILD: No SOB at rest, mild SOB with walking, speaks normally in sentences, can lie down, no retractions, pulse < 100.    - MODERATE: SOB at rest, SOB with minimal exertion and prefers to sit, cannot lie down flat, speaks in phrases, mild retractions, audible wheezing, pulse 100-120.    - SEVERE: Very SOB at rest, speaks in single words, struggling to breathe, sitting hunched forward, retractions, pulse > 120      Mild-baseline shortness of breath with activity 6. FEVER: "Do you have a  fever?" If Yes, ask: "What is your temperature, how was it measured, and when did it start?"     no 7. CARDIAC HISTORY: "Do you have any history of heart disease?" (e.g., heart attack, congestive heart failure)      HTN 8. LUNG HISTORY: "Do you have any history of lung disease?"  (e.g., pulmonary embolus, asthma, emphysema)     asthma 9. PE  RISK FACTORS: "Do you have a history of blood clots?" (or: recent major surgery, recent prolonged travel, bedridden)     no  Protocols used: Cough - Acute Productive-A-AH

## 2024-01-21 ENCOUNTER — Other Ambulatory Visit: Payer: Self-pay | Admitting: Pulmonary Disease

## 2024-01-21 NOTE — Telephone Encounter (Signed)
 Dr. Waylan Haggard, please advise, Meds not mentioned in last OV note.

## 2024-02-19 ENCOUNTER — Inpatient Hospital Stay (HOSPITAL_BASED_OUTPATIENT_CLINIC_OR_DEPARTMENT_OTHER)
Admission: EM | Admit: 2024-02-19 | Discharge: 2024-02-23 | DRG: 175 | Disposition: A | Discharge status: Home Health | Attending: Internal Medicine | Admitting: Internal Medicine

## 2024-02-19 ENCOUNTER — Emergency Department (HOSPITAL_BASED_OUTPATIENT_CLINIC_OR_DEPARTMENT_OTHER)

## 2024-02-19 ENCOUNTER — Encounter (HOSPITAL_BASED_OUTPATIENT_CLINIC_OR_DEPARTMENT_OTHER): Payer: Self-pay | Admitting: Emergency Medicine

## 2024-02-19 ENCOUNTER — Other Ambulatory Visit: Payer: Self-pay

## 2024-02-19 DIAGNOSIS — Z87891 Personal history of nicotine dependence: Secondary | ICD-10-CM

## 2024-02-19 DIAGNOSIS — I2693 Single subsegmental pulmonary embolism without acute cor pulmonale: Principal | ICD-10-CM | POA: Diagnosis present

## 2024-02-19 DIAGNOSIS — J45909 Unspecified asthma, uncomplicated: Secondary | ICD-10-CM | POA: Diagnosis present

## 2024-02-19 DIAGNOSIS — Z823 Family history of stroke: Secondary | ICD-10-CM

## 2024-02-19 DIAGNOSIS — R001 Bradycardia, unspecified: Secondary | ICD-10-CM | POA: Diagnosis present

## 2024-02-19 DIAGNOSIS — Z9981 Dependence on supplemental oxygen: Secondary | ICD-10-CM

## 2024-02-19 DIAGNOSIS — I2699 Other pulmonary embolism without acute cor pulmonale: Secondary | ICD-10-CM | POA: Diagnosis not present

## 2024-02-19 DIAGNOSIS — Z833 Family history of diabetes mellitus: Secondary | ICD-10-CM

## 2024-02-19 DIAGNOSIS — N179 Acute kidney failure, unspecified: Secondary | ICD-10-CM | POA: Diagnosis present

## 2024-02-19 DIAGNOSIS — Z888 Allergy status to other drugs, medicaments and biological substances status: Secondary | ICD-10-CM

## 2024-02-19 DIAGNOSIS — Z801 Family history of malignant neoplasm of trachea, bronchus and lung: Secondary | ICD-10-CM

## 2024-02-19 DIAGNOSIS — I2510 Atherosclerotic heart disease of native coronary artery without angina pectoris: Secondary | ICD-10-CM | POA: Diagnosis present

## 2024-02-19 DIAGNOSIS — G4733 Obstructive sleep apnea (adult) (pediatric): Secondary | ICD-10-CM | POA: Diagnosis present

## 2024-02-19 DIAGNOSIS — J84112 Idiopathic pulmonary fibrosis: Secondary | ICD-10-CM | POA: Diagnosis present

## 2024-02-19 DIAGNOSIS — Z79899 Other long term (current) drug therapy: Secondary | ICD-10-CM

## 2024-02-19 DIAGNOSIS — Z880 Allergy status to penicillin: Secondary | ICD-10-CM

## 2024-02-19 DIAGNOSIS — Z7951 Long term (current) use of inhaled steroids: Secondary | ICD-10-CM

## 2024-02-19 DIAGNOSIS — Z7985 Long-term (current) use of injectable non-insulin antidiabetic drugs: Secondary | ICD-10-CM

## 2024-02-19 DIAGNOSIS — J189 Pneumonia, unspecified organism: Principal | ICD-10-CM | POA: Diagnosis present

## 2024-02-19 DIAGNOSIS — D649 Anemia, unspecified: Secondary | ICD-10-CM | POA: Diagnosis present

## 2024-02-19 DIAGNOSIS — E119 Type 2 diabetes mellitus without complications: Secondary | ICD-10-CM | POA: Diagnosis present

## 2024-02-19 DIAGNOSIS — Z7901 Long term (current) use of anticoagulants: Secondary | ICD-10-CM

## 2024-02-19 DIAGNOSIS — Z882 Allergy status to sulfonamides status: Secondary | ICD-10-CM

## 2024-02-19 DIAGNOSIS — Z79890 Hormone replacement therapy (postmenopausal): Secondary | ICD-10-CM

## 2024-02-19 DIAGNOSIS — Z881 Allergy status to other antibiotic agents status: Secondary | ICD-10-CM

## 2024-02-19 DIAGNOSIS — Z90710 Acquired absence of both cervix and uterus: Secondary | ICD-10-CM

## 2024-02-19 DIAGNOSIS — I10 Essential (primary) hypertension: Secondary | ICD-10-CM | POA: Diagnosis present

## 2024-02-19 DIAGNOSIS — D72829 Elevated white blood cell count, unspecified: Secondary | ICD-10-CM | POA: Diagnosis present

## 2024-02-19 DIAGNOSIS — I272 Pulmonary hypertension, unspecified: Secondary | ICD-10-CM | POA: Diagnosis present

## 2024-02-19 DIAGNOSIS — Z7984 Long term (current) use of oral hypoglycemic drugs: Secondary | ICD-10-CM

## 2024-02-19 DIAGNOSIS — Z8249 Family history of ischemic heart disease and other diseases of the circulatory system: Secondary | ICD-10-CM

## 2024-02-19 DIAGNOSIS — E039 Hypothyroidism, unspecified: Secondary | ICD-10-CM | POA: Diagnosis present

## 2024-02-19 LAB — COMPREHENSIVE METABOLIC PANEL WITH GFR
ALT: 19 U/L (ref 0–44)
AST: 29 U/L (ref 15–41)
Albumin: 3.4 g/dL — ABNORMAL LOW (ref 3.5–5.0)
Alkaline Phosphatase: 49 U/L (ref 38–126)
Anion gap: 14 (ref 5–15)
BUN: 26 mg/dL — ABNORMAL HIGH (ref 8–23)
CO2: 18 mmol/L — ABNORMAL LOW (ref 22–32)
Calcium: 8.7 mg/dL — ABNORMAL LOW (ref 8.9–10.3)
Chloride: 103 mmol/L (ref 98–111)
Creatinine, Ser: 1.31 mg/dL — ABNORMAL HIGH (ref 0.44–1.00)
GFR, Estimated: 42 mL/min — ABNORMAL LOW (ref >60)
Glucose, Bld: 132 mg/dL — ABNORMAL HIGH (ref 70–99)
Potassium: 4.4 mmol/L (ref 3.5–5.1)
Sodium: 135 mmol/L (ref 135–145)
Total Bilirubin: 0.4 mg/dL (ref 0.0–1.2)
Total Protein: 6.6 g/dL (ref 6.5–8.1)

## 2024-02-19 LAB — CBC
HCT: 33.7 % — ABNORMAL LOW (ref 36.0–46.0)
Hemoglobin: 10.9 g/dL — ABNORMAL LOW (ref 12.0–15.0)
MCH: 29.9 pg (ref 26.0–34.0)
MCHC: 32.3 g/dL (ref 30.0–36.0)
MCV: 92.6 fL (ref 80.0–100.0)
Platelets: 274 K/uL (ref 150–400)
RBC: 3.64 MIL/uL — ABNORMAL LOW (ref 3.87–5.11)
RDW: 14.4 % (ref 11.5–15.5)
WBC: 13.4 K/uL — ABNORMAL HIGH (ref 4.0–10.5)
nRBC: 0 % (ref 0.0–0.2)

## 2024-02-19 LAB — TROPONIN T, HIGH SENSITIVITY: Troponin T High Sensitivity: <15 ng/L (ref <19)

## 2024-02-19 MED ORDER — SODIUM CHLORIDE 0.9 % IV SOLN
500.0000 mg | Freq: Once | INTRAVENOUS | Status: AC
  Administered 2024-02-19: 500 mg via INTRAVENOUS
  Filled 2024-02-19: qty 5

## 2024-02-19 MED ORDER — MORPHINE SULFATE (PF) 4 MG/ML IV SOLN
4.0000 mg | Freq: Once | INTRAVENOUS | Status: AC
  Administered 2024-02-19: 4 mg via INTRAVENOUS
  Filled 2024-02-19: qty 1

## 2024-02-19 MED ORDER — ONDANSETRON HCL 4 MG/2ML IJ SOLN
4.0000 mg | Freq: Once | INTRAMUSCULAR | Status: AC
  Administered 2024-02-19: 4 mg via INTRAVENOUS
  Filled 2024-02-19: qty 2

## 2024-02-19 MED ORDER — SODIUM CHLORIDE 0.9 % IV SOLN
1.0000 g | Freq: Once | INTRAVENOUS | Status: AC
  Administered 2024-02-19: 1 g via INTRAVENOUS
  Filled 2024-02-19: qty 10

## 2024-02-19 MED ORDER — IOHEXOL 350 MG/ML SOLN
60.0000 mL | Freq: Once | INTRAVENOUS | Status: AC | PRN
  Administered 2024-02-19: 60 mL via INTRAVENOUS

## 2024-02-19 MED ORDER — APIXABAN 5 MG PO TABS: 5.0000 mg | ORAL_TABLET | Freq: Two times a day (BID) | ORAL | Status: DC

## 2024-02-19 MED ORDER — LACTATED RINGERS IV BOLUS
500.0000 mL | Freq: Once | INTRAVENOUS | Status: AC
  Administered 2024-02-19: 500 mL via INTRAVENOUS

## 2024-02-19 MED ORDER — APIXABAN 5 MG PO TABS
10.0000 mg | ORAL_TABLET | Freq: Two times a day (BID) | ORAL | Status: DC
  Administered 2024-02-19 – 2024-02-23 (×8): 10 mg via ORAL
  Filled 2024-02-19 (×6): qty 2
  Filled 2024-02-19: qty 4
  Filled 2024-02-19: qty 2

## 2024-02-19 NOTE — ED Provider Notes (Signed)
 [facility] [facility] Provider Note   CSN: [id number] Arrival date & time: 02/19/24  1807     Patient presents with: Chest Pain   Kathryn Scott is a 78 y.o. female.   Patient is a 78 year old female with a history of asthma, pulmonary fibrosis, hypertension, CAD without stents, hypothyroidism, diabetes who is presenting today with complaints of chest pain and shortness of breath.  Patient reports several weeks ago she developed bronchitis with a worsening of her chronic cough with thicker mucus which she feels like she never completely got over but her biggest complaint is of chest pain.  She reports since last week she had had some pain on the right side of her chest that went into her back that had been constant and worse with deep breaths and coughing.  She had taking care of her great grandchild and thought she may have strained something however it has worsened since Monday and now involves both sides of her chest that radiates to the back.  She has noticed she has been more winded doing even minor tasks.  She has not had as much appetite but denies any abdominal pain nausea or vomiting or fever.  No leg swelling.  Her inhaler is not helping with the pain and she did take Tylenol on Tuesday and thought it helped some with the pain but has not taken anything today.  The history is provided by the patient and medical records.  Chest Pain      Prior to Admission medications   Medication Sig Start Date End Date Taking? Authorizing Provider  albuterol  (VENTOLIN  HFA) 108 (90 Base) MCG/ACT inhaler Inhale 2 puffs into the lungs every 6 (six) hours as needed for wheezing or shortness of breath. 05/29/20   Mannam, Praveen, MD  amLODipine (NORVASC) 10 MG tablet Take by mouth. 05/11/18   [provider]  BREO ELLIPTA  200-25 MCG/ACT AEPB USE 1 INHALATION DAILY 01/21/24   Mannam, Praveen, MD  carvedilol (COREG) 12.5 MG tablet Take 12.5 mg by mouth 2 (two)  times daily with a meal. 04/27/19   [provider]  Cholecalciferol 50 MCG (2000 UT) CAPS Take by mouth.    [provider]  fenofibrate  120 MG TABS Take 1 tablet (120 mg total) by mouth daily. 02/24/23   Patwardhan, Newman PARAS, MD  ferrous sulfate 325 (65 FE) MG tablet Take by mouth.    [provider]  levothyroxine (SYNTHROID) 75 MCG tablet Take by mouth. 05/11/18   [provider]  Magnesium 400 MG CAPS     [provider]  metFORMIN (GLUCOPHAGE) 500 MG tablet Take 500 mg by mouth daily with breakfast.    [provider]  mometasone (ELOCON) 0.1 % ointment Apply sparingly to affected area 3 times weekly as directed. 02/04/19   [provider]  montelukast  (SINGULAIR ) 10 MG tablet TAKE 1 TABLET AT BEDTIME 01/21/24   Mannam, Praveen, MD  MOUNJARO 5 MG/0.5ML Pen Inject 5 mg into the skin once a week.    [provider]  OFEV  100 MG CAPS TAKE 1 CAPSULE BY MOUTH 2 TIMES A DAY WITH FOOD. 10/28/23   Mannam, Praveen, MD  omeprazole (PRILOSEC) 40 MG capsule Take by mouth. 02/22/19   [provider]  rosuvastatin  (CRESTOR ) 40 MG tablet Take 1 tablet (40 mg total) by mouth daily. 02/24/23 05/25/23  Patwardhan, Newman PARAS, MD  sertraline (ZOLOFT) 50 MG tablet Take by mouth. 05/11/18   [provider]  VASCEPA  1 g capsule Take 2 capsules (2 g total) by mouth 2 (two) times daily. 09/08/23   Patwardhan, Newman PARAS, MD    Allergies: Clindamycin/lincomycin, Lisinopril, Penicillins, and Sulfa antibiotics    Review of Systems  Cardiovascular:  Positive for chest pain.    Updated Vital Signs BP 109/64   Pulse 76   Temp 98.2 F (36.8 C) (Oral)   Resp 13   Ht 5' 6 (1.676 m)   Wt 68.9 kg   SpO2 95%   BMI 24.53 kg/m   Physical Exam Vitals and nursing note reviewed.  Constitutional:      General: She is not in acute distress.    Appearance: She is well-developed.  HENT:     Head: Normocephalic and atraumatic.  Eyes:      Pupils: Pupils are equal, round, and reactive to light.  Cardiovascular:     Rate and Rhythm: Normal rate. Rhythm irregular. Frequent Extrasystoles are present.    Heart sounds: Normal heart sounds. No murmur heard.    No friction rub.  Pulmonary:     Effort: Pulmonary effort is normal. Tachypnea present.     Breath sounds: Examination of the right-lower field reveals rales. Examination of the left-lower field reveals rales. Rales present. No wheezing.  Chest:     Chest wall: Tenderness present.  Abdominal:     General: Bowel sounds are normal. There is no distension.     Palpations: Abdomen is soft.     Tenderness: There is no abdominal tenderness. There is no guarding or rebound.  Musculoskeletal:        General: No tenderness. Normal range of motion.     Comments: No edema  Skin:    General: Skin is warm and dry.     Findings: No rash.  Neurological:     Mental Status: She is alert and oriented to person, place, and time.     Cranial Nerves: No cranial nerve deficit.  Psychiatric:        Behavior: Behavior normal.     (all labs ordered are listed, but only abnormal results are displayed) Labs Reviewed  CBC - Abnormal; Notable for the following components:      Result Value   WBC 13.4 (*)    RBC 3.64 (*)    Hemoglobin 10.9 (*)    HCT 33.7 (*)    All other components within normal limits  COMPREHENSIVE METABOLIC PANEL WITH GFR - Abnormal; Notable for the following components:   CO2 18 (*)    Glucose, Bld 132 (*)    BUN 26 (*)    Creatinine, Ser 1.31 (*)    Calcium  8.7 (*)    Albumin  3.4 (*)    GFR, Estimated 42 (*)    All other components within normal limits  TROPONIN T, HIGH SENSITIVITY    EKG: EKG Interpretation Date/Time:  Thursday February 19 2024 18:28:13 EDT Ventricular Rate:  99 PR Interval:    QRS Duration:  117 QT Interval:  334 QTC Calculation: 345 R Axis:   -8  Text Interpretation: Accelerated junctional rhythm Ventricular bigeminy Nonspecific  intraventricular conduction delay No previous tracing Confirmed by Doretha Folks ([id number]) on 02/19/2024 6:44:55 PM  Radiology: CT Angio Chest PE W and/or Wo Contrast Result Date: 02/19/2024 CLINICAL DATA:  High probability for PE.  Shortness of breath. EXAM: CT ANGIOGRAPHY CHEST WITH CONTRAST TECHNIQUE: Multidetector CT imaging of the chest was performed using the standard protocol during bolus administration of intravenous contrast. Multiplanar CT image  reconstructions and MIPs were obtained to evaluate the vascular anatomy. RADIATION DOSE REDUCTION: This exam was performed according to the departmental dose-optimization program which includes automated exposure control, adjustment of the mA and/or kV according to patient size and/or use of iterative reconstruction technique. CONTRAST:  60mL OMNIPAQUE  IOHEXOL  350 MG/ML SOLN COMPARISON:  CT of the chest 07/25/2023. FINDINGS: Cardiovascular: There is a segmental pulmonary embolism in the right upper lobe image 10/62. No other pulmonary emboli are seen. The heart is enlarged. Aorta is normal in size. There are atherosclerotic calcifications of the aorta. Mediastinum/Nodes: There are nonenlarged and mildly enlarged paratracheal lymph nodes measuring up to 11 mm. Visualized thyroid gland and esophagus are within normal limits. There is a small hiatal hernia. Lungs/Pleura: There is a small right pleural effusion. Peripheral reticular opacities and honeycombing again seen throughout both lungs, progressed compared to 2024. There is new airspace consolidation and ground-glass opacities in the posterior and inferior right upper lobe. No pneumothorax visualized. Upper Abdomen: No acute abnormality. Musculoskeletal: No chest wall abnormality. No acute or significant osseous findings. Review of the MIP images confirms the above findings. IMPRESSION: 1. Segmental pulmonary embolism in the right upper lobe. No evidence for right heart strain. 2. New airspace consolidation  and ground-glass opacities in the right upper lobe worrisome for pneumonia. 3. Small right pleural effusion. 4. Progressive interstitial lung disease. 5. Cardiomegaly. 6. Aortic atherosclerosis. Aortic Atherosclerosis (ICD10-I70.0). Electronically Signed   By: Greig Pique M.D.   On: 02/19/2024 21:04   DG Chest 2 View Result Date: 02/19/2024 EXAM: 2 VIEW(S) XRAY OF THE CHEST 02/19/2024 06:58:00 PM COMPARISON: Radiographs August 23, 2021 and CT chest July 25, 2023. CLINICAL HISTORY: CP. Pt has a hx of IP and asthma. Pt says she was carrying her grandson and might have pulled something. Pt reports CP and lower back pain that is progressively getting worse. Visible SOB that is more than usual per pt. FINDINGS: LUNGS AND PLEURA: Airspace opacity in the right mid lung suspicious for pneumonia. Follow up in 6-8 weeks after treatment is recommended to ensure resolution. Diffuse bronchial wall thickening and interstitial coarsening. No pleural effusion or pneumothorax. HEART AND MEDIASTINUM: Stable cardiomediastinal silhouette. Aortic atherosclerotic calcification. BONES AND SOFT TISSUES: No acute osseous abnormality. IMPRESSION: 1. Airspace opacity in the right mid lung suspicious for pneumonia. Follow-up in 8 weeks after treatment is recommended to ensure resolution. 2. Diffuse bronchial wall thickening and interstitial coarsening likely related to underlying interstitial lung disease . Electronically signed by: Norman Gatlin MD 02/19/2024 07:32 PM EDT RP Workstation: [id number]     Procedures   Medications Ordered in the ED  cefTRIAXone  (ROCEPHIN ) 1 g in sodium chloride  0.9 % 100 mL IVPB (1 g Intravenous New Bag/Given 02/19/24 2126)  azithromycin  (ZITHROMAX ) 500 mg in sodium chloride  0.9 % 250 mL IVPB (500 mg Intravenous New Bag/Given 02/19/24 2139)  apixaban  (ELIQUIS ) tablet 10 mg (has no administration in time range)    Followed by  apixaban  (ELIQUIS ) tablet 5 mg (has no administration in time  range)  ondansetron  (ZOFRAN ) injection 4 mg (4 mg Intravenous Given 02/19/24 2004)  morphine  (PF) 4 MG/ML injection 4 mg (4 mg Intravenous Given 02/19/24 2004)  lactated ringers  bolus 500 mL (0 mLs Intravenous Stopped 02/19/24 2030)  iohexol  (OMNIPAQUE ) 350 MG/ML injection 60 mL (60 mLs Intravenous Contrast Given 02/19/24 2040)  Medical Decision Making Amount and/or Complexity of Data Reviewed External Data Reviewed: notes. Labs: ordered. Decision-making details documented in ED Course. Radiology: ordered and independent interpretation performed. Decision-making details documented in ED Course. ECG/medicine tests: ordered and independent interpretation performed. Decision-making details documented in ED Course.  Risk Prescription drug management. Decision regarding hospitalization.   Pt with multiple medical problems and comorbidities and presenting today with a complaint that caries a high risk for morbidity and mortality.  Here today with above complaints with concern for pneumonia, PE, ACS, pericarditis, anemia, lower suspicion for GI etiology or aortic dissection.  I independently reviewed and interpreted patient's labs and EKG.  EKG today shows he had irregular rhythm with frequent PVCs.  Patient is in bigeminy most of the time and then she is on continuous cardiac monitoring which I independently interpreted with paired PVCs but appears to otherwise be in a sinus rhythm.  CBC with leukocytosis of 13, stable hemoglobin of 10.9, troponin is negative, CMP with mild AKI today with creatinine of 1.31 with normal electrolytes and LFTs.  I have independently visualized and interpreted pt's images today.  Chest x-ray today with concern for pneumonia with her right middle lobe opacity but also patient has significant scarring from her pulmonary fibrosis.  CT to further identify and ensure that there is no sign of PE.  Patient sat is 95% on room air at rest but she  becomes winded even talking and has no wheezing at this time. She was given pain control and IV fluids.  9:41 PM CTA with evidence of right sided pneumonia but radiology also reports that patient has a small segmental PE in the right upper lobe.  Findings were discussed with the patient.  She has no evidence of heart strain and negative troponin at this time.  Patient given Rocephin  and azithromycin .  Also after further discussion with the patient she has no contraindications to starting Eliquis  for the PE and was started on that as well.  Feel that patient would benefit from admission to the hospital and, hospitalist was consulted for further care.  Patient and her husband are comfortable with this plan.  CRITICAL CARE Performed by: Eily Louvier Total critical care time: 30 minutes Critical care time was exclusive of separately billable procedures and treating other patients. Critical care was necessary to treat or prevent imminent or life-threatening deterioration. Critical care was time spent personally by me on the following activities: development of treatment plan with patient and/or surrogate as well as nursing, discussions with consultants, evaluation of patient's response to treatment, examination of patient, obtaining history from patient or surrogate, ordering and performing treatments and interventions, ordering and review of laboratory studies, ordering and review of radiographic studies, pulse oximetry and re-evaluation of patient's condition.      Final diagnoses:  Community acquired pneumonia of right middle lobe of lung  Single subsegmental pulmonary embolism without acute cor pulmonale [facility])    ED Discharge Orders     None          Doretha Folks, MD 02/19/24 2141

## 2024-02-19 NOTE — ED Notes (Signed)
 Pt advised she's had upper R chest pain with tenderness and worsening cough for a month. Thought it was costochondritis but her PCP was concerned for PNA since she's been coughing up yellow mucous for 6x weeks now. No fevers noted, or aches, but pt does complain of resp complications in past, and is concerned for same.

## 2024-02-19 NOTE — ED Triage Notes (Signed)
 Chest  since Monday and  it has gotten worse each day had some sob more than normal no n/v had a cough and it hurts  to cough which is new

## 2024-02-19 NOTE — Plan of Care (Signed)
 [facility] transfer:  77 year old female history of asthma, pulmonary fibrosis, hypertension, CAD without any cardiac stent, hypothyroidism, DM type II presented emergency department complaining of chest pain shortness of breath.  Patient reported several weeks ago she has developed episodes of bronchitis with worsening chronic cough with thickening of the sputum which has been never completely improved however her main concern is chest pain.  Patient reported since last week patient has some right-sided chest pain that radiates to the back and constant and getting worse with coughing and deep breathing.She had taking care of her great grandchild and thought she may have strained something however it has worsened since Monday and now involves both sides of her chest that radiates to the back. She has noticed she has been more winded doing even minor tasks. She has not had as much appetite but denies any abdominal pain nausea or vomiting or fever. No leg swelling. Her inhaler is not helping with the pain and she did take Tylenol on Tuesday and thought it helped some with the pain but has not taken anything today.   At presentation to ED patient is hemodynamically stable. CMP showing low bicarb 18, elevated creatinine 1.31 and low GFR 42. Normal troponin less than 15. CBC showing leukocytosis 13.4, stable H&H normal platelet count. EKG showed accelerated junctional rhythm and ventricular bigeminy heart rate 99.  Chest x-ray suspicion for pneumonia.  CTA chest showed segmental pulmonary embolism in the right upper lobe without any evidence of right heart strain.  New airspace consolidation with groundglass opacity right lower lobe worrisome for pneumonia.  Small right pleural effusion.  Cardiomegaly.  Aortic atherosclerosis.     In the ED patient has been started on Eliquis .  Also received ceftriaxone  and azithromycin  for treatment of pneumonia.  As  well as patient received 1 L of LR bolus.  Hospitalist has been consulted for further evaluation management of right upper lobe PE, pneumonia, acute kidney injury.

## 2024-02-20 ENCOUNTER — Other Ambulatory Visit: Payer: Self-pay | Admitting: Home Health

## 2024-02-20 ENCOUNTER — Inpatient Hospital Stay (HOSPITAL_COMMUNITY)

## 2024-02-20 ENCOUNTER — Other Ambulatory Visit (HOSPITAL_COMMUNITY): Payer: Self-pay

## 2024-02-20 DIAGNOSIS — D649 Anemia, unspecified: Secondary | ICD-10-CM

## 2024-02-20 DIAGNOSIS — Z882 Allergy status to sulfonamides status: Secondary | ICD-10-CM | POA: Diagnosis not present

## 2024-02-20 DIAGNOSIS — I272 Pulmonary hypertension, unspecified: Secondary | ICD-10-CM | POA: Diagnosis present

## 2024-02-20 DIAGNOSIS — Z833 Family history of diabetes mellitus: Secondary | ICD-10-CM | POA: Diagnosis not present

## 2024-02-20 DIAGNOSIS — J189 Pneumonia, unspecified organism: Secondary | ICD-10-CM | POA: Diagnosis present

## 2024-02-20 DIAGNOSIS — I2699 Other pulmonary embolism without acute cor pulmonale: Secondary | ICD-10-CM

## 2024-02-20 DIAGNOSIS — D72829 Elevated white blood cell count, unspecified: Secondary | ICD-10-CM | POA: Diagnosis not present

## 2024-02-20 DIAGNOSIS — Z8249 Family history of ischemic heart disease and other diseases of the circulatory system: Secondary | ICD-10-CM | POA: Diagnosis not present

## 2024-02-20 DIAGNOSIS — I2693 Single subsegmental pulmonary embolism without acute cor pulmonale: Secondary | ICD-10-CM

## 2024-02-20 DIAGNOSIS — M7989 Other specified soft tissue disorders: Secondary | ICD-10-CM | POA: Diagnosis not present

## 2024-02-20 DIAGNOSIS — N179 Acute kidney failure, unspecified: Secondary | ICD-10-CM

## 2024-02-20 DIAGNOSIS — G4733 Obstructive sleep apnea (adult) (pediatric): Secondary | ICD-10-CM | POA: Diagnosis present

## 2024-02-20 DIAGNOSIS — I493 Ventricular premature depolarization: Secondary | ICD-10-CM

## 2024-02-20 DIAGNOSIS — I10 Essential (primary) hypertension: Secondary | ICD-10-CM | POA: Diagnosis present

## 2024-02-20 DIAGNOSIS — J84112 Idiopathic pulmonary fibrosis: Secondary | ICD-10-CM | POA: Diagnosis present

## 2024-02-20 DIAGNOSIS — E039 Hypothyroidism, unspecified: Secondary | ICD-10-CM

## 2024-02-20 DIAGNOSIS — Z9981 Dependence on supplemental oxygen: Secondary | ICD-10-CM | POA: Diagnosis not present

## 2024-02-20 DIAGNOSIS — Z880 Allergy status to penicillin: Secondary | ICD-10-CM | POA: Diagnosis not present

## 2024-02-20 DIAGNOSIS — Z888 Allergy status to other drugs, medicaments and biological substances status: Secondary | ICD-10-CM | POA: Diagnosis not present

## 2024-02-20 DIAGNOSIS — Z87891 Personal history of nicotine dependence: Secondary | ICD-10-CM | POA: Diagnosis not present

## 2024-02-20 DIAGNOSIS — J45909 Unspecified asthma, uncomplicated: Secondary | ICD-10-CM | POA: Diagnosis present

## 2024-02-20 DIAGNOSIS — E119 Type 2 diabetes mellitus without complications: Secondary | ICD-10-CM | POA: Diagnosis present

## 2024-02-20 DIAGNOSIS — Z7984 Long term (current) use of oral hypoglycemic drugs: Secondary | ICD-10-CM | POA: Diagnosis not present

## 2024-02-20 DIAGNOSIS — I2510 Atherosclerotic heart disease of native coronary artery without angina pectoris: Secondary | ICD-10-CM | POA: Diagnosis present

## 2024-02-20 DIAGNOSIS — Z881 Allergy status to other antibiotic agents status: Secondary | ICD-10-CM | POA: Diagnosis not present

## 2024-02-20 DIAGNOSIS — Z7901 Long term (current) use of anticoagulants: Secondary | ICD-10-CM | POA: Diagnosis not present

## 2024-02-20 DIAGNOSIS — Z7985 Long-term (current) use of injectable non-insulin antidiabetic drugs: Secondary | ICD-10-CM | POA: Diagnosis not present

## 2024-02-20 DIAGNOSIS — Z79890 Hormone replacement therapy (postmenopausal): Secondary | ICD-10-CM | POA: Diagnosis not present

## 2024-02-20 DIAGNOSIS — I498 Other specified cardiac arrhythmias: Secondary | ICD-10-CM

## 2024-02-20 DIAGNOSIS — R0609 Other forms of dyspnea: Secondary | ICD-10-CM

## 2024-02-20 LAB — BASIC METABOLIC PANEL WITH GFR
Anion gap: 11 (ref 5–15)
BUN: 21 mg/dL (ref 8–23)
CO2: 22 mmol/L (ref 22–32)
Calcium: 8.9 mg/dL (ref 8.9–10.3)
Chloride: 102 mmol/L (ref 98–111)
Creatinine, Ser: 1.35 mg/dL — ABNORMAL HIGH (ref 0.44–1.00)
GFR, Estimated: 41 mL/min — ABNORMAL LOW (ref >60)
Glucose, Bld: 115 mg/dL — ABNORMAL HIGH (ref 70–99)
Potassium: 4 mmol/L (ref 3.5–5.1)
Sodium: 135 mmol/L (ref 135–145)

## 2024-02-20 LAB — TSH: TSH: 5.191 u[IU]/mL — ABNORMAL HIGH (ref 0.350–4.500)

## 2024-02-20 LAB — URINALYSIS, ROUTINE W REFLEX MICROSCOPIC
Bacteria, UA: NONE SEEN
Bilirubin Urine: NEGATIVE
Glucose, UA: NEGATIVE mg/dL
Hgb urine dipstick: NEGATIVE
Ketones, ur: NEGATIVE mg/dL
Nitrite: NEGATIVE
Protein, ur: NEGATIVE mg/dL
Specific Gravity, Urine: 1.006 (ref 1.005–1.030)
pH: 7 (ref 5.0–8.0)

## 2024-02-20 LAB — ANTITHROMBIN III: AntiThromb III Func: 75 % (ref 75–120)

## 2024-02-20 LAB — ECHOCARDIOGRAM COMPLETE
Area-P 1/2: 3.85 cm2
Height: 66 in
S' Lateral: 2.8 cm
Weight: 2497.37 [oz_av]

## 2024-02-20 LAB — PROCALCITONIN: Procalcitonin: 0.19 ng/mL

## 2024-02-20 MED ORDER — PANTOPRAZOLE SODIUM 40 MG PO TBEC
40.0000 mg | DELAYED_RELEASE_TABLET | Freq: Every day | ORAL | Status: DC
  Administered 2024-02-20 – 2024-02-23 (×4): 40 mg via ORAL
  Filled 2024-02-20 (×4): qty 1

## 2024-02-20 MED ORDER — FENOFIBRATE 160 MG PO TABS
160.0000 mg | ORAL_TABLET | Freq: Every day | ORAL | Status: DC
  Administered 2024-02-20 – 2024-02-23 (×4): 160 mg via ORAL
  Filled 2024-02-20 (×4): qty 1

## 2024-02-20 MED ORDER — GUAIFENESIN ER 600 MG PO TB12
600.0000 mg | ORAL_TABLET | Freq: Two times a day (BID) | ORAL | Status: DC
  Administered 2024-02-20 – 2024-02-23 (×7): 600 mg via ORAL
  Filled 2024-02-20 (×7): qty 1

## 2024-02-20 MED ORDER — FLUTICASONE FUROATE-VILANTEROL 200-25 MCG/ACT IN AEPB
1.0000 | INHALATION_SPRAY | Freq: Every day | RESPIRATORY_TRACT | Status: DC
  Administered 2024-02-20 – 2024-02-23 (×4): 1 via RESPIRATORY_TRACT
  Filled 2024-02-20 (×2): qty 28

## 2024-02-20 MED ORDER — PERFLUTREN LIPID MICROSPHERE: 1.0000 mL | INTRAVENOUS | Status: AC | PRN

## 2024-02-20 MED ORDER — AMLODIPINE BESYLATE 10 MG PO TABS
10.0000 mg | ORAL_TABLET | Freq: Every day | ORAL | Status: DC
  Administered 2024-02-20 – 2024-02-21 (×2): 10 mg via ORAL
  Filled 2024-02-20 (×2): qty 1

## 2024-02-20 MED ORDER — ACETAMINOPHEN 650 MG RE SUPP: 650.0000 mg | Freq: Four times a day (QID) | RECTAL | Status: DC | PRN

## 2024-02-20 MED ORDER — SERTRALINE HCL 100 MG PO TABS
100.0000 mg | ORAL_TABLET | Freq: Every day | ORAL | Status: DC
  Administered 2024-02-20 – 2024-02-22 (×3): 100 mg via ORAL
  Filled 2024-02-20 (×3): qty 1

## 2024-02-20 MED ORDER — CARVEDILOL 25 MG PO TABS
25.0000 mg | ORAL_TABLET | Freq: Two times a day (BID) | ORAL | Status: DC
  Administered 2024-02-20 – 2024-02-21 (×3): 25 mg via ORAL
  Filled 2024-02-20 (×3): qty 1

## 2024-02-20 MED ORDER — SODIUM CHLORIDE 0.9 % IV SOLN: INTRAVENOUS | Status: AC

## 2024-02-20 MED ORDER — ROSUVASTATIN CALCIUM 20 MG PO TABS
40.0000 mg | ORAL_TABLET | Freq: Every day | ORAL | Status: DC
  Administered 2024-02-20 – 2024-02-23 (×4): 40 mg via ORAL
  Filled 2024-02-20 (×4): qty 2

## 2024-02-20 MED ORDER — ACETAMINOPHEN 325 MG PO TABS
650.0000 mg | ORAL_TABLET | Freq: Four times a day (QID) | ORAL | Status: DC | PRN
  Administered 2024-02-20: 650 mg via ORAL
  Filled 2024-02-20: qty 2

## 2024-02-20 MED ORDER — ONDANSETRON HCL 4 MG/2ML IJ SOLN: 4.0000 mg | Freq: Four times a day (QID) | INTRAMUSCULAR | Status: DC | PRN

## 2024-02-20 MED ORDER — LEVOTHYROXINE SODIUM 75 MCG PO TABS
75.0000 ug | ORAL_TABLET | Freq: Every day | ORAL | Status: DC
  Administered 2024-02-21 – 2024-02-23 (×3): 75 ug via ORAL
  Filled 2024-02-20 (×3): qty 1

## 2024-02-20 MED ORDER — SODIUM CHLORIDE 0.9 % IV SOLN
2.0000 g | INTRAVENOUS | Status: DC
  Administered 2024-02-20 – 2024-02-22 (×3): 2 g via INTRAVENOUS
  Filled 2024-02-20 (×3): qty 20

## 2024-02-20 MED ORDER — AZITHROMYCIN 250 MG PO TABS
500.0000 mg | ORAL_TABLET | Freq: Every day | ORAL | Status: DC
  Administered 2024-02-20 – 2024-02-22 (×3): 500 mg via ORAL
  Filled 2024-02-20 (×3): qty 2

## 2024-02-20 MED ORDER — MONTELUKAST SODIUM 10 MG PO TABS
10.0000 mg | ORAL_TABLET | Freq: Every day | ORAL | Status: DC
  Administered 2024-02-20 – 2024-02-22 (×3): 10 mg via ORAL
  Filled 2024-02-20 (×3): qty 1

## 2024-02-20 MED ORDER — SODIUM CHLORIDE 0.9 % IV SOLN: 500.0000 mg | INTRAVENOUS | Status: DC

## 2024-02-20 MED ORDER — HYDROCODONE-ACETAMINOPHEN 5-325 MG PO TABS: 1.0000 | ORAL_TABLET | Freq: Four times a day (QID) | ORAL | Status: DC | PRN

## 2024-02-20 MED ORDER — NINTEDANIB ESYLATE 100 MG PO CAPS
100.0000 mg | ORAL_CAPSULE | Freq: Two times a day (BID) | ORAL | Status: DC
  Administered 2024-02-21 – 2024-02-23 (×4): 100 mg via ORAL
  Filled 2024-02-20 (×4): qty 1

## 2024-02-20 NOTE — Progress Notes (Signed)
 BLE venous exam completed. Robynne Roat, RVT

## 2024-02-20 NOTE — Progress Notes (Signed)
 2 week Zio ordered per hospitlaist Dr Claudene request look for reason why patient had PE. Primary cardiologist Dr Elmira to follow. Appt arranged on 03/24/24. Message sent to office to arrange monitor.

## 2024-02-20 NOTE — Discharge Instructions (Addendum)
 Information on my medicine - ELIQUIS  (apixaban )  This medication education was reviewed with me or my healthcare representative as part of my discharge preparation.     Why was Eliquis  prescribed for you? Eliquis  was prescribed to treat blood clots that may have been found in the veins of your legs (deep vein thrombosis) or in your lungs (pulmonary embolism) and to reduce the risk of them occurring again.  What do You need to know about Eliquis  ? The starting dose is 10 mg (two 5 mg tablets) taken TWICE daily for the FIRST SEVEN (7) DAYS, then on ( 02/26/24  the dose is reduced to ONE 5 mg tablet taken TWICE daily.  Eliquis  may be taken with or without food.   Try to take the dose about the same time in the morning and in the evening. If you have difficulty swallowing the tablet whole please discuss with your pharmacist how to take the medication safely.  Take Eliquis  exactly as prescribed and DO NOT stop taking Eliquis  without talking to the doctor who prescribed the medication.  Stopping may increase your risk of developing a new blood clot.  Refill your prescription before you run out.  After discharge, you should have regular check-up appointments with your healthcare provider that is prescribing your Eliquis .    What do you do if you miss a dose? If a dose of ELIQUIS  is not taken at the scheduled time, take it as soon as possible on the same day and twice-daily administration should be resumed. The dose should not be doubled to make up for a missed dose.  Important Safety Information A possible side effect of Eliquis  is bleeding. You should call your healthcare provider right away if you experience any of the following: Bleeding from an injury or your nose that does not stop. Unusual colored urine (red or dark brown) or unusual colored stools (red or black). Unusual bruising for unknown reasons. A serious fall or if you hit your head (even if there is no bleeding).  Some  medicines may interact with Eliquis  and might increase your risk of bleeding or clotting while on Eliquis . To help avoid this, consult your healthcare provider or pharmacist prior to using any new prescription or non-prescription medications, including herbals, vitamins, non-steroidal anti-inflammatory drugs (NSAIDs) and supplements.  This website has more information on Eliquis  (apixaban ): [email or web address] .com/eliquis /home   ===================================================  Pulmonary Embolism    A pulmonary embolism (PE) is a sudden blockage or decrease of blood flow in one or both lungs. Most blockages come from a blood clot that forms in the vein of a lower leg, thigh, or arm (deep vein thrombosis, DVT) and travels to the lungs. A clot is blood that has thickened into a gel or solid. PE is a dangerous and life-threatening condition that needs to be treated right away.  What are the causes? This condition is usually caused by a blood clot that forms in a vein and moves to the lungs. In rare cases, it may be caused by air, fat, part of a tumor, or other tissue that moves through the veins and into the lungs.  What increases the risk? The following factors may make you more likely to develop this condition: Experiencing a traumatic injury, such as breaking a hip or leg. Having: A spinal cord injury. Orthopedic surgery, especially hip or knee replacement. Any major surgery. A stroke. DVT. Blood clots or blood clotting disease. Long-term (chronic) lung or heart disease. Cancer treated with chemotherapy. A central  venous catheter. Taking medicines that contain estrogen. These include birth control pills and hormone replacement therapy. Being: Pregnant. In the period of time after your baby is delivered (postpartum). Older than age 76. Overweight. A smoker, especially if you have other risks.  What are the signs or symptoms? Symptoms of this condition usually start suddenly  and include: Shortness of breath during activity or at rest. Coughing, coughing up blood, or coughing up blood-tinged mucus. Chest pain that is often worse with deep breaths. Rapid or irregular heartbeat. Feeling light-headed or dizzy. Fainting. Feeling anxious. Fever. Sweating. Pain and swelling in a leg. This is a symptom of DVT, which can lead to PE. How is this diagnosed? This condition may be diagnosed based on: Your medical history. A physical exam. Blood tests. CT pulmonary angiogram. This test checks blood flow in and around your lungs. Ventilation-perfusion scan, also called a lung VQ scan. This test measures air flow and blood flow to the lungs. An ultrasound of the legs.  How is this treated? Treatment for this condition depends on many factors, such as the cause of your PE, your risk for bleeding or developing more clots, and other medical conditions you have. Treatment aims to remove, dissolve, or stop blood clots from forming or growing larger. Treatment may include: Medicines, such as: Blood thinning medicines (anticoagulants) to stop clots from forming. Medicines that dissolve clots (thrombolytics). Procedures, such as: Using a flexible tube to remove a blood clot (embolectomy) or to deliver medicine to destroy it (catheter-directed thrombolysis). Inserting a filter into a large vein that carries blood to the heart (inferior vena cava). This filter (vena cava filter) catches blood clots before they reach the lungs. Surgery to remove the clot (surgical embolectomy). This is rare. You may need a combination of immediate, long-term (up to 3 months after diagnosis), and extended (more than 3 months after diagnosis) treatments. Your treatment may continue for several months (maintenance therapy). You and your health care provider will work together to choose the treatment program that is best for you.  Follow these instructions at home: Medicines Take over-the-counter and  prescription medicines only as told by your health care provider. If you are taking an anticoagulant medicine: Take the medicine every day at the same time each day. Understand what foods and drugs interact with your medicine. Understand the side effects of this medicine, including excessive bruising or bleeding. Ask your health care provider or pharmacist about other side effects.  General instructions Wear a medical alert bracelet or carry a medical alert card that says you have had a PE and lists what medicines you take. Ask your health care provider when you may return to your normal activities. Avoid sitting or lying for a long time without moving. Maintain a healthy weight. Ask your health care provider what weight is healthy for you. Do not use any products that contain nicotine or tobacco, such as cigarettes, e-cigarettes, and chewing tobacco. If you need help quitting, ask your health care provider. Talk with your health care provider about any travel plans. It is important to make sure that you are still able to take your medicine while on trips. Keep all follow-up visits as told by your health care provider. This is important.  Contact a health care provider if: You missed a dose of your blood thinner medicine.  Get help right away if: You have: New or increased pain, swelling, warmth, or redness in an arm or leg. Numbness or tingling in an arm or  leg. Shortness of breath during activity or at rest. A fever. Chest pain. A rapid or irregular heartbeat. A severe headache. Vision changes. A serious fall or accident, or you hit your head. Stomach (abdominal) pain. Blood in your vomit, stool, or urine. A cut that will not stop bleeding. You cough up blood. You feel light-headed or dizzy. You cannot move your arms or legs. You are confused or have memory loss.  These symptoms may represent a serious problem that is an emergency. Do not wait to see if the symptoms will go  away. Get medical help right away. Call your local emergency services (911 in the [place] Do not drive yourself to the hospital. Summary A pulmonary embolism (PE) is a sudden blockage or decrease of blood flow in one or both lungs. PE is a dangerous and life-threatening condition that needs to be treated right away. Treatments for this condition usually include medicines to thin your blood (anticoagulants) or medicines to break apart blood clots (thrombolytics). If you are given blood thinners, it is important to take the medicine every day at the same time each day. Understand what foods and drugs interact with any medicines that you are taking. If you have signs of PE or DVT, call your local emergency services (911 in the [place] This information is not intended to replace advice given to you by your health care provider. Make sure you discuss any questions you have with your health care provider. Document Revised: 04/29/2018 Document Reviewed: 04/29/2018 Elsevier Patient Education  2020 [employer].

## 2024-02-20 NOTE — Progress Notes (Signed)
  Echocardiogram 2D Echocardiogram has been performed.  Kathryn Scott 02/20/2024, 3:55 PM

## 2024-02-20 NOTE — H&P (Addendum)
 History and Physical    Patient: Kathryn Scott FMW:[id number] DOB: 1946/04/17 DOA: 02/19/2024 DOS: the patient was seen and examined on 02/20/2024 PCP: Carvin [name], MD  [facility]  Chief Complaint:  Chief Complaint  Patient presents with   Chest Pain   HPI: Kathryn Scott is a 78 y.o. female with medical history significant of asthma, pulmonary fibrosis, hypertension, CAD, hypothyroidism, and diabetes mellitus type 2 presents with complaints of persistent cough and chest pain.  She has been experiencing a persistent cough and right-sided chest pain following a recent cold. The symptoms began a few weeks ago with a severe cold that she could not fully recover from, leading to a persistent cough. This past Monday, she had a severe coughing fit that she could not stop, initially attributing the pain to costochondritis due to similar past experiences.  The chest pain radiates to her back and is tender to the touch. She sought medical attention last night due to feeling horrible and experiencing significant pain. No recent leg pain, but continues to experience chest pain on the right side.  She has a history of interstitial pulmonary fibrosis and uses oxygen as needed, particularly when driving long distances to prevent her oxygen levels from dropping. She experienced chills a couple of nights ago without a fever, despite feeling very cold and using an electric blanket to warm up. She notes a slight increase in temperature above normal at one point.  She has a history of sleep apnea and uses a CPAP machine, which her partner brought to the hospital for her use.   She has a strong family history of heart disease but no known history of blood clots or cancer.  In the emergency department patient was noted to be afebrile with vital signs relatively maintained.  Labs significant for WBC 13.4, CO2 18, creatinine 1.31, and  high-sensitivity troponin less than 15.  Chest x-ray was suspicious for pneumonia.  CTA of the chest noted segmental pulmonary embolism in the right upper lobe without any evidence of right heart strain, new airspace consolidation with groundglass opacity right lower lobe worrisome for pneumonia, small right pleural effusion, cardiomegaly, and aortic atherosclerosis.  Patient had given 1 L of lactated Ringer 's, Eliquis , Rocephin , azithromycin .   Review of Systems: As mentioned in the history of present illness. All other systems reviewed and are negative. Past Medical History:  Diagnosis Date   Asthma    Diabetes mellitus without complication (HCC)    Hypertension    Past Surgical History:  Procedure Laterality Date   CARPAL TUNNEL RELEASE Bilateral    DE QUERVAIN'S RELEASE     THYROIDECTOMY, PARTIAL     TRIGGER FINGER RELEASE     bilateral thumbs, left index finger   VAGINAL HYSTERECTOMY     Social History:  reports that she quit smoking about 38 years ago. Her smoking use included cigarettes. She started smoking about 58 years ago. She has a 60 pack-year smoking history. She has never used smokeless tobacco. She reports current alcohol use. She reports that she does not use drugs.  Allergies  Allergen Reactions   Clindamycin/Lincomycin    Lisinopril     Swelling/systemic reaction.   Penicillins     hives   Sulfa Antibiotics     Red/itching    Family History  Problem Relation Age of Onset   Stroke Mother    Stroke Father    Heart disease Father    Lung cancer Father  Heart attack Father    Heart attack Brother    Heart disease Brother    Diabetes Son    Stroke Son    Diabetes Son    Diabetes Daughter    Psoriasis Daughter    Allergies Daughter     Prior to Admission medications   Medication Sig Start Date End Date Taking? Authorizing Provider  albuterol  (VENTOLIN  HFA) 108 (90 Base) MCG/ACT inhaler Inhale 2 puffs into the lungs every 6 (six) hours as needed for  wheezing or shortness of breath. 05/29/20   Mannam, Praveen, MD  amLODipine (NORVASC) 10 MG tablet Take by mouth. 05/11/18   [provider]  BREO ELLIPTA  200-25 MCG/ACT AEPB USE 1 INHALATION DAILY 01/21/24   Mannam, Praveen, MD  carvedilol (COREG) 12.5 MG tablet Take 12.5 mg by mouth 2 (two) times daily with a meal. 04/27/19   [provider]  Cholecalciferol 50 MCG (2000 UT) CAPS Take by mouth.    [provider]  fenofibrate  120 MG TABS Take 1 tablet (120 mg total) by mouth daily. 02/24/23   Patwardhan, Newman PARAS, MD  ferrous sulfate 325 (65 FE) MG tablet Take by mouth.    [provider]  levothyroxine (SYNTHROID) 75 MCG tablet Take by mouth. 05/11/18   [provider]  Magnesium 400 MG CAPS     [provider]  metFORMIN (GLUCOPHAGE) 500 MG tablet Take 500 mg by mouth daily with breakfast.    [provider]  mometasone (ELOCON) 0.1 % ointment Apply sparingly to affected area 3 times weekly as directed. 02/04/19   [provider]  montelukast  (SINGULAIR ) 10 MG tablet TAKE 1 TABLET AT BEDTIME 01/21/24   Mannam, Praveen, MD  MOUNJARO 5 MG/0.5ML Pen Inject 5 mg into the skin once a week.    [provider]  OFEV  100 MG CAPS TAKE 1 CAPSULE BY MOUTH 2 TIMES A DAY WITH FOOD. 10/28/23   Mannam, Praveen, MD  omeprazole (PRILOSEC) 40 MG capsule Take by mouth. 02/22/19   [provider]  rosuvastatin  (CRESTOR ) 40 MG tablet Take 1 tablet (40 mg total) by mouth daily. 02/24/23 05/25/23  Patwardhan, Newman PARAS, MD  sertraline (ZOLOFT) 50 MG tablet Take by mouth. 05/11/18   [provider]  VASCEPA  1 g capsule Take 2 capsules (2 g total) by mouth 2 (two) times daily. 09/08/23   Elmira Newman PARAS, MD    Physical Exam: Vitals:   02/20/24 0700 02/20/24 0745 02/20/24 0746 02/20/24 0847  BP: (!) 131/59   (!) 128/46  Pulse: 61 62  65  Resp: 13 13    Temp:   97.9 F (36.6 C) 98.4 F (36.9 C)  TempSrc:   Oral Oral   SpO2: 99% 100%  98%  Weight:    70.8 kg  Height:    5' 6 (1.676 m)  Constitutional: Elderly female currently in no acute distress Eyes: PERRL, lids and conjunctivae normal ENMT: Mucous membranes are moist. Posterior pharynx clear of any exudate or lesions.Normal dentition.  Neck: normal, supple, no masses, no thyromegaly Respiratory: clear to auscultation bilaterally, no wheezing, no crackles.  Currently on 2 L nasal cannula oxygen.  Able to speak in complete sentences Cardiovascular: Regular rate and rhythm, no murmurs / rubs / gallops. No extremity edema. 2+ pedal pulses. No carotid bruits.  Abdomen: no tenderness, no masses palpated. No hepatosplenomegaly. Bowel sounds positive.  Musculoskeletal: no clubbing / cyanosis. No joint deformity upper and lower extremities. Good ROM, no contractures. Normal muscle  tone.  Skin: no rashes, lesions, ulcers. No induration Neurologic: CN 2-12 grossly intact. Sensation intact, DTR normal. Strength 5/5 in all 4.  Psychiatric: Normal judgment and insight. Alert and oriented x 3. Normal mood.   Data Reviewed:  EKG showed accelerated junctional rhythm and ventricular bigeminy heart rate 99.  Reviewed labs, imaging, and pertinent records as documented.  Assessment and Plan:  Pulmonary embolism Acute.  Patient presented with complaints of worsening shortness of breath.  CTA of the chest revealed segmental pulmonary embolism in the right upper lobe with no evidence of right heart strain. - Admit to a telemetry bed - Incentive spirometry and flutter valve - Follow-up hypercoagulable panel - Continue Eliquis  - Message sent to Avelina Bonds for need of heart monitor - Check Doppler ultrasound of the lower extremities and echocardiogram  Community-acquired pneumonia Acute.  Patient reports having persistent cough.  CTA of the chest noted new airspace consolidation with groundglass opacity in the right lower lobe worrisome for pneumonia.  Patient has  been started on empiric antibiotics of Rocephin  and azithromycin . - Check  procalcitonin - Continue Rocephin  and azithromycin  - Mucinex  Leukocytosis Acute.  WBC elevated at 13.4.  Suspect secondary to above. - Continue to monitor  Interstitial pulmonary fibrosis Asthma without acute exacerbation Patient has a history of asthma and UIP interstitial pulmonary fibrosis and is on Ofev  as well as followed by pulmonology in the outpatient setting - Continue Singulair  and Breo - Albuterol  nebs as needed for shortness of breath/wheezing - Continue outpatient follow-up with pulmonology  Acute kidney injury Creatinine noted to be elevated at 1.31 with BUN 26.  Baseline creatinine previously noted to be around 0.8 - Check urinalysis - Gentle IV fluids - Recheck kidney function in a.m.  Normocytic anemia Hemoglobin 10.9 with normal MCV and MCH which appears lower than prior.  Patient denies any reports of bleeding. - Recheck CBC tomorrow morning  Essential hypertension Blood pressures currently maintained. - Continue Coreg and amlodipine as tolerated  Controlled diabetes mellitus type 2, without long-term use of insulin Patient is currently on metformin and had previously been on Mounjaro but stopped due to side effects.  Last hemoglobin A1c noted to be 5.5 on 7/1. - Held metformin  Hypothyroidism - Continue levothyroxine  OSA on CPAP - Continue CPAP nightly  DVT prophylaxis: Eliquis   Advance Care Planning:   Code Status: Full Code    Consults: None  Family Communication: Husband updated at bedside  Severity of Illness: The appropriate patient status for this patient is INPATIENT. Inpatient status is judged to be reasonable and necessary in order to provide the required intensity of service to ensure the patient's safety. The patient's presenting symptoms, physical exam findings, and initial radiographic and laboratory data in the context of their chronic comorbidities is felt to  place them at high risk for further clinical deterioration. Furthermore, it is not anticipated that the patient will be medically stable for discharge from the hospital within 2 midnights of admission.   * I certify that at the point of admission it is my clinical judgment that the patient will require inpatient hospital care spanning beyond 2 midnights from the point of admission due to high intensity of service, high risk for further deterioration and high frequency of surveillance required.*  Author: Maximino DELENA Sharps, MD 02/20/2024 9:18 AM  For on call review [email or web address].

## 2024-02-21 ENCOUNTER — Other Ambulatory Visit (HOSPITAL_COMMUNITY): Payer: Self-pay

## 2024-02-21 DIAGNOSIS — I2699 Other pulmonary embolism without acute cor pulmonale: Secondary | ICD-10-CM | POA: Diagnosis not present

## 2024-02-21 LAB — CBC
HCT: 29.9 % — ABNORMAL LOW (ref 36.0–46.0)
Hemoglobin: 9.5 g/dL — ABNORMAL LOW (ref 12.0–15.0)
MCH: 29.8 pg (ref 26.0–34.0)
MCHC: 31.8 g/dL (ref 30.0–36.0)
MCV: 93.7 fL (ref 80.0–100.0)
Platelets: 243 K/uL (ref 150–400)
RBC: 3.19 MIL/uL — ABNORMAL LOW (ref 3.87–5.11)
RDW: 13.9 % (ref 11.5–15.5)
WBC: 9.3 K/uL (ref 4.0–10.5)
nRBC: 0 % (ref 0.0–0.2)

## 2024-02-21 LAB — RESPIRATORY PANEL BY PCR

## 2024-02-21 LAB — PROTEIN C, TOTAL: Protein C, Total: 96 % (ref 60–150)

## 2024-02-21 LAB — PROTEIN C ACTIVITY: Protein C Activity: 97 % (ref 73–180)

## 2024-02-21 LAB — DRVVT MIX: dRVVT Mix: 70.8 s — ABNORMAL HIGH (ref 0.0–40.4)

## 2024-02-21 LAB — BASIC METABOLIC PANEL WITH GFR
Anion gap: 7 (ref 5–15)
BUN: 15 mg/dL (ref 8–23)
CO2: 21 mmol/L — ABNORMAL LOW (ref 22–32)
Calcium: 8.4 mg/dL — ABNORMAL LOW (ref 8.9–10.3)
Chloride: 109 mmol/L (ref 98–111)
Creatinine, Ser: 1.24 mg/dL — ABNORMAL HIGH (ref 0.44–1.00)
GFR, Estimated: 45 mL/min — ABNORMAL LOW (ref >60)
Glucose, Bld: 157 mg/dL — ABNORMAL HIGH (ref 70–99)
Potassium: 4.3 mmol/L (ref 3.5–5.1)
Sodium: 137 mmol/L (ref 135–145)

## 2024-02-21 LAB — LUPUS ANTICOAGULANT PANEL
DRVVT: 112.4 s — ABNORMAL HIGH (ref 0.0–47.0)
PTT Lupus Anticoagulant: 40.1 s (ref 0.0–43.5)

## 2024-02-21 LAB — HOMOCYSTEINE: Homocysteine: 13.9 umol/L (ref 0.0–19.2)

## 2024-02-21 LAB — DRVVT CONFIRM: dRVVT Confirm: 1.1 ratio (ref 0.8–1.2)

## 2024-02-21 LAB — PROTEIN S ACTIVITY: Protein S Activity: 94 % (ref 63–140)

## 2024-02-21 LAB — PROTEIN S, TOTAL: Protein S Ag, Total: 100 % (ref 60–150)

## 2024-02-21 MED ORDER — IPRATROPIUM-ALBUTEROL 0.5-2.5 (3) MG/3ML IN SOLN
3.0000 mL | Freq: Four times a day (QID) | RESPIRATORY_TRACT | Status: DC | PRN
  Administered 2024-02-21 – 2024-02-23 (×3): 3 mL via RESPIRATORY_TRACT
  Filled 2024-02-21 (×3): qty 3

## 2024-02-21 MED ORDER — GUAIFENESIN ER 600 MG PO TB12
600.0000 mg | ORAL_TABLET | Freq: Two times a day (BID) | ORAL | 0 refills | Status: AC
  Filled 2024-02-21: qty 10, 5d supply, fill #0

## 2024-02-21 MED ORDER — APIXABAN (ELIQUIS) VTE STARTER PACK (10MG AND 5MG)
ORAL_TABLET | ORAL | 0 refills | Status: DC
  Filled 2024-02-21: qty 74, 30d supply, fill #0

## 2024-02-21 MED ORDER — CEFDINIR 300 MG PO CAPS
300.0000 mg | ORAL_CAPSULE | Freq: Two times a day (BID) | ORAL | 0 refills | Status: AC
  Filled 2024-02-21: qty 6, 3d supply, fill #0

## 2024-02-21 MED ORDER — AZITHROMYCIN 500 MG PO TABS
500.0000 mg | ORAL_TABLET | Freq: Every day | ORAL | 0 refills | Status: AC
  Filled 2024-02-21: qty 3, 3d supply, fill #0

## 2024-02-21 MED ORDER — BENZONATATE 100 MG PO CAPS
200.0000 mg | ORAL_CAPSULE | Freq: Three times a day (TID) | ORAL | Status: DC | PRN
  Administered 2024-02-22: 200 mg via ORAL
  Filled 2024-02-21: qty 2

## 2024-02-21 NOTE — Plan of Care (Signed)
  Problem: Health Behavior/Discharge Planning: Goal: Ability to manage health-related needs will improve Outcome: Progressing   Problem: Clinical Measurements: Goal: Respiratory complications will improve Outcome: Progressing Goal: Cardiovascular complication will be avoided Outcome: Progressing   Problem: Activity: Goal: Risk for activity intolerance will decrease Outcome: Progressing   Problem: Nutrition: Goal: Adequate nutrition will be maintained Outcome: Progressing   Problem: Elimination: Goal: Will not experience complications related to urinary retention Outcome: Progressing   Problem: Safety: Goal: Ability to remain free from injury will improve Outcome: Progressing

## 2024-02-21 NOTE — Progress Notes (Signed)
   02/21/24 2008  Assess: MEWS Score  Temp (!) 100.5 F (38.1 C)  BP (!) 100/49  MAP (mmHg) 65  Pulse Rate 79  ECG Heart Rate 84  Resp 18  Level of Consciousness Alert  SpO2 90 %  O2 Device Nasal Cannula  O2 Flow Rate (L/min) 2 L/min  Assess: MEWS Score  MEWS Temp 1  MEWS Systolic 1  MEWS Pulse 0  MEWS RR 0  MEWS LOC 0  MEWS Score 2  MEWS Score Color Yellow  Assess: if the MEWS score is Yellow or Red  Were vital signs accurate and taken at a resting state? Yes  Does the patient meet 2 or more of the SIRS criteria? Yes  Does the patient have a confirmed or suspected source of infection? Yes  MEWS guidelines implemented  Yes, yellow  Treat  MEWS Interventions Considered administering scheduled or prn medications/treatments as ordered  Take Vital Signs  Increase Vital Sign Frequency  Yellow: Q2hr x1, continue Q4hrs until patient remains green for 12hrs  Escalate  MEWS: Escalate Yellow: Discuss with charge nurse and consider notifying provider and/or RRT  Notify: Charge Nurse/RN  Name of Charge Nurse/RN Notified Heather, RN  Assess: SIRS CRITERIA  SIRS Temperature  0  SIRS Respirations  0  SIRS Pulse 0  SIRS WBC 0  SIRS Score Sum  0

## 2024-02-21 NOTE — Progress Notes (Signed)
 PROGRESS NOTE  Kathryn Scott FMW:[id number] DOB: [birth year] DOA: 02/19/2024 PCP: Carvin [name], MD  HPI/Recap of past 24 hours: Kathryn Scott is a 78 y.o. female with medical history significant of asthma, pulmonary fibrosis, HTN, CAD, hypothyroidism, DM2, presents with complaints of worsening cough and R- sided chest pain for a couple of days. In the ED, patient was noted to be afebrile with vital signs fairly stable. Labs significant for WBC 13.4, CO2 18, creatinine 1.31, and troponin <15. Chest x-ray was suspicious for pneumonia.  CTA of the chest noted segmental PE in the right upper lobe without any evidence of right heart strain, new airspace consolidation with groundglass opacity right lower lobe worrisome for pneumonia.  Patient admitted for further management.    Today, patient reports feeling better, cough still present, noted right upper chest wall with reproducible pain.  Denies any worsening shortness of breath, left-sided chest pain, abdominal pain, nausea/vomiting, fever/chills.    Assessment/Plan: Principal Problem:   Pulmonary embolism (HCC) Active Problems:   CAP (community acquired pneumonia)   Leukocytosis   IPF (idiopathic pulmonary fibrosis) (HCC)   AKI (acute kidney injury) (HCC)   Normocytic anemia   Essential hypertension   Controlled type 2 diabetes mellitus without complication, without long-term current use of insulin  (HCC)   Acquired hypothyroidism   Acute pulmonary embolism Currently on about 2 L of O2 saturating 97%, plan to wean off (uses O2- 2L, only while driving long distances) CTA of the chest revealed segmental pulmonary embolism in the right upper lobe with no evidence of right heart strain Echo with EF of 65 to 70%, no regional wall motion abnormality, moderately elevated pulmonary artery systolic pressure Doppler negative for DVT Follow-up hypercoagulable panel Continue Eliquis  Zio ordered for 2 weeks, outpatient follow-up  with cardiology  Community-acquired pneumonia Currently afebrile, with resolved leukocytosis Procalcitonin 0.19 Respiratory viral panel negative CTA of the chest noted new airspace consolidation with groundglass opacity in the right lower lobe worrisome for pneumonia Continue Rocephin  and azithromycin  Mucinex , incentive spirometry, flutter valve   Interstitial pulmonary fibrosis Asthma without acute exacerbation Patient has a history of asthma and UIP interstitial pulmonary fibrosis and is on Ofev  as well as followed by pulmonology in the outpatient setting Continue Singulair  and Breo Albuterol  nebs as needed for shortness of breath/wheezing Continue outpatient follow-up with pulmonology   Acute kidney injury Creatinine noted to be elevated at 1.31 with BUN 26, baseline around 1.1 per Care Everywhere S/p gentle IV hydration Daily BMP   Normocytic anemia Baseline hemoglobin around 12 Daily CBC   Essential hypertension BP now soft Continue Coreg , hold amlodipine  for now   Controlled diabetes mellitus type 2, without long-term use of insulin  Last A1c 5.5 on 02/03/2024 Patient is currently on metformin and had previously been on Mounjaro but stopped due to side effects Held metformin Monitor CBGs   Hypothyroidism Continue levothyroxine    OSA on CPAP Continue CPAP nightly    Estimated body mass index is 25.19 kg/m as calculated from the following:   Height as of this encounter: 5' 6 (1.676 m).   Weight as of this encounter: 70.8 kg.     Code Status: Full  Family Communication: None at bedside  Disposition Plan: Status is: Inpatient Remains inpatient appropriate because: Level of care      Consultants: None  Procedures: None  Antimicrobials: Ceftriaxone  Azithromycin   DVT prophylaxis: Eliquis    Objective: Vitals:   02/21/24 0308 02/21/24 0637 02/21/24 0815 02/21/24 1232  BP: (!) 109/53  ROLLEN)  116/51 (!) 104/53  Pulse: 79 60 65 60  Resp: 20  18 16    Temp: 98.8 F (37.1 C)  98.6 F (37 C) 99.1 F (37.3 C)  TempSrc: Oral  Oral Oral  SpO2: 93% 97% 97% 97%  Weight:      Height:        Intake/Output Summary (Last 24 hours) at 02/21/2024 1359 Last data filed at 02/21/2024 0603 Gross per 24 hour  Intake 1051.72 ml  Output 700 ml  Net 351.72 ml   Filed Weights   02/19/24 1822 02/20/24 0847  Weight: 68.9 kg 70.8 kg    Exam: General: NAD  Cardiovascular: S1, S2 present Respiratory: Fine bilateral crackles noted Abdomen: Soft, nontender, nondistended, bowel sounds present Musculoskeletal: No bilateral pedal edema noted Skin: Normal Psychiatry: Normal mood     Data Reviewed: CBC: Recent Labs  Lab 02/19/24 1830 02/21/24 0305  WBC 13.4* 9.3  HGB 10.9* 9.5*  HCT 33.7* 29.9*  MCV 92.6 93.7  PLT 274 243   Basic Metabolic Panel: Recent Labs  Lab 02/19/24 1830 02/20/24 0939 02/21/24 0305  NA 135 135 137  K 4.4 4.0 4.3  CL 103 102 109  CO2 18* 22 21*  GLUCOSE 132* 115* 157*  BUN 26* 21 15  CREATININE 1.31* 1.35* 1.24*  CALCIUM  8.7* 8.9 8.4*   GFR: Estimated Creatinine Clearance: 36.7 mL/min (A) (by C-G formula based on SCr of 1.24 mg/dL (H)). Liver Function Tests: Recent Labs  Lab 02/19/24 1830  AST 29  ALT 19  ALKPHOS 49  BILITOT 0.4  PROT 6.6  ALBUMIN  3.4*   No results for input(s): LIPASE, AMYLASE in the last 168 hours. No results for input(s): AMMONIA in the last 168 hours. Coagulation Profile: No results for input(s): INR, PROTIME in the last 168 hours. Cardiac Enzymes: No results for input(s): CKTOTAL, CKMB, CKMBINDEX, TROPONINI in the last 168 hours. BNP (last 3 results) No results for input(s): PROBNP in the last 8760 hours. HbA1C: No results for input(s): HGBA1C in the last 72 hours. CBG: No results for input(s): GLUCAP in the last 168 hours. Lipid Profile: No results for input(s): CHOL, HDL, LDLCALC, TRIG, CHOLHDL, LDLDIRECT in the last 72  hours. Thyroid Function Tests: Recent Labs    02/20/24 0939  TSH 5.191*   Anemia Panel: No results for input(s): VITAMINB12, FOLATE, FERRITIN, TIBC, IRON, RETICCTPCT in the last 72 hours. Urine analysis:    Component Value Date/Time   COLORURINE STRAW (A) 02/20/2024 2050   APPEARANCEUR CLEAR 02/20/2024 2050   LABSPEC 1.006 02/20/2024 2050   PHURINE 7.0 02/20/2024 2050   GLUCOSEU NEGATIVE 02/20/2024 2050   HGBUR NEGATIVE 02/20/2024 2050   BILIRUBINUR NEGATIVE 02/20/2024 2050   KETONESUR NEGATIVE 02/20/2024 2050   PROTEINUR NEGATIVE 02/20/2024 2050   NITRITE NEGATIVE 02/20/2024 2050   LEUKOCYTESUR SMALL (A) 02/20/2024 2050   Sepsis Labs: @LABRCNTIP (procalcitonin:4,lacticidven:4)  ) Recent Results (from the past 240 hours)  Respiratory (~20 pathogens) panel by PCR     Status: None   Collection Time: 02/21/24  7:38 AM   Specimen: Nasopharyngeal Swab; Respiratory  Result Value Ref Range Status   Adenovirus NOT DETECTED NOT DETECTED Final   Coronavirus 229E NOT DETECTED NOT DETECTED Final    Comment: (NOTE) The Coronavirus on the Respiratory Panel, DOES NOT test for the novel  Coronavirus (2019 nCoV)    Coronavirus HKU1 NOT DETECTED NOT DETECTED Final   Coronavirus NL63 NOT DETECTED NOT DETECTED Final   Coronavirus OC43 NOT DETECTED NOT DETECTED Final  Metapneumovirus NOT DETECTED NOT DETECTED Final   Rhinovirus / Enterovirus NOT DETECTED NOT DETECTED Final   Influenza A NOT DETECTED NOT DETECTED Final   Influenza B NOT DETECTED NOT DETECTED Final   Parainfluenza Virus 1 NOT DETECTED NOT DETECTED Final   Parainfluenza Virus 2 NOT DETECTED NOT DETECTED Final   Parainfluenza Virus 3 NOT DETECTED NOT DETECTED Final   Parainfluenza Virus 4 NOT DETECTED NOT DETECTED Final   Respiratory Syncytial Virus NOT DETECTED NOT DETECTED Final   Bordetella pertussis NOT DETECTED NOT DETECTED Final   Bordetella Parapertussis NOT DETECTED NOT DETECTED Final   Chlamydophila  pneumoniae NOT DETECTED NOT DETECTED Final   Mycoplasma pneumoniae NOT DETECTED NOT DETECTED Final    Comment: Performed at [facility], [address] [place], [place] [address]      Studies: VAS US  LOWER EXTREMITY VENOUS (DVT) Result Date: 02/21/2024  Lower Venous DVT Study Patient Name:  Kathryn Scott  Date of Exam:   02/20/2024 Medical Rec #: [id number]       Accession #:    [id number] Date of Birth: 10-24-1945      Patient Gender: F Patient Age:   78 years Exam Location:  [facility] Procedure:      VAS US  LOWER EXTREMITY VENOUS (DVT) Referring Phys: MICAELA SPEAKER --------------------------------------------------------------------------------  Indications: Swelling, and Edema.  Performing Technologist: Elmarie Lindau, RVT  Examination Guidelines: A complete evaluation includes B-mode imaging, spectral Doppler, color Doppler, and power Doppler as needed of all accessible portions of each vessel. Bilateral testing is considered an integral part of a complete examination. Limited examinations for reoccurring indications may be performed as noted. The reflux portion of the exam is performed with the patient in reverse Trendelenburg.  +---------+---------------+---------+-----------+----------+--------------+ RIGHT    CompressibilityPhasicitySpontaneityPropertiesThrombus Aging +---------+---------------+---------+-----------+----------+--------------+ CFV      Full           Yes      Yes                                 +---------+---------------+---------+-----------+----------+--------------+ SFJ      Full                                                        +---------+---------------+---------+-----------+----------+--------------+ FV Prox  Full                                                        +---------+---------------+---------+-----------+----------+--------------+ FV Mid   Full                                                         +---------+---------------+---------+-----------+----------+--------------+ FV DistalFull                                                        +---------+---------------+---------+-----------+----------+--------------+  PFV      Full                                                        +---------+---------------+---------+-----------+----------+--------------+ POP      Full           Yes      Yes                                 +---------+---------------+---------+-----------+----------+--------------+ PTV      Full                                                        +---------+---------------+---------+-----------+----------+--------------+ PERO     Full                                                        +---------+---------------+---------+-----------+----------+--------------+   +---------+---------------+---------+-----------+----------+--------------+ LEFT     CompressibilityPhasicitySpontaneityPropertiesThrombus Aging +---------+---------------+---------+-----------+----------+--------------+ CFV      Full           Yes      Yes                                 +---------+---------------+---------+-----------+----------+--------------+ SFJ      Full                                                        +---------+---------------+---------+-----------+----------+--------------+ FV Prox  Full                                                        +---------+---------------+---------+-----------+----------+--------------+ FV Mid   Full                                                        +---------+---------------+---------+-----------+----------+--------------+ FV DistalFull                                                        +---------+---------------+---------+-----------+----------+--------------+ PFV      Full                                                         +---------+---------------+---------+-----------+----------+--------------+  POP      Full           Yes      Yes                                 +---------+---------------+---------+-----------+----------+--------------+ PTV      Full                                                        +---------+---------------+---------+-----------+----------+--------------+ PERO     Full                                                        +---------+---------------+---------+-----------+----------+--------------+     Summary: RIGHT: - There is no evidence of deep vein thrombosis in the lower extremity.  - No cystic structure found in the popliteal fossa.  LEFT: - There is no evidence of deep vein thrombosis in the lower extremity.  - No cystic structure found in the popliteal fossa.  *See table(s) above for measurements and observations. Electronically signed by Fonda Rim on 02/21/2024 at 9:55:35 AM.    Final    ECHOCARDIOGRAM COMPLETE Result Date: 02/20/2024    ECHOCARDIOGRAM REPORT   Patient Name:   Kathryn Scott Date of Exam: 02/20/2024 Medical Rec #:  [id number]      Height:       66.0 in Accession #:    [id number]     Weight:       156.1 lb Date of Birth:  1946/01/20     BSA:          1.800 m Patient Age:    77 years       BP:           127/61 mmHg Patient Gender: F              HR:           62 bpm. Exam Location:  Inpatient Procedure: 2D Echo (Both Spectral and Color Flow Doppler were utilized during            procedure). Indications:    pulmonary embolus  History:        Patient has prior history of Echocardiogram examinations, most                 recent 08/25/2023. CAD; Risk Factors:Hypertension, Dyslipidemia                 and Diabetes.  Sonographer:    Tinnie Barefoot RDCS Referring Phys: [id number] SUBRINA SUNDIL IMPRESSIONS  1. Prominent moderator band in RV (images 35, 51). Left ventricular ejection fraction, by estimation, is 65 to 70%. The left ventricle has normal function. The  left ventricle has no regional wall motion abnormalities.  2. Right ventricular systolic function is normal. The right ventricular size is normal. There is moderately elevated pulmonary artery systolic pressure.  3. The mitral valve is normal in structure. Trivial mitral valve regurgitation.  4. The aortic valve is tricuspid. Aortic valve regurgitation is trivial.  5. The inferior vena cava is normal in size with greater than 50% respiratory  variability, suggesting right atrial pressure of 3 mmHg. Comparison(s): The left ventricular function is unchanged. FINDINGS  Left Ventricle: Prominent moderator band in RV (images 35, 51). Left ventricular ejection fraction, by estimation, is 65 to 70%. The left ventricle has normal function. The left ventricle has no regional wall motion abnormalities. The left ventricular internal cavity size was normal in size. There is no left ventricular hypertrophy. Right Ventricle: The right ventricular size is normal. Right vetricular wall thickness was not assessed. Right ventricular systolic function is normal. There is moderately elevated pulmonary artery systolic pressure. The tricuspid regurgitant velocity is  3.51 m/s, and with an assumed right atrial pressure of 3 mmHg, the estimated right ventricular systolic pressure is 52.3 mmHg. Left Atrium: Left atrial size was normal in size. Right Atrium: Right atrial size was normal in size. Pericardium: There is no evidence of pericardial effusion. Mitral Valve: The mitral valve is normal in structure. Trivial mitral valve regurgitation. Tricuspid Valve: The tricuspid valve is normal in structure. Tricuspid valve regurgitation is trivial. Aortic Valve: The aortic valve is tricuspid. Aortic valve regurgitation is trivial. Pulmonic Valve: The pulmonic valve was normal in structure. Pulmonic valve regurgitation is mild. Aorta: The aortic root and ascending aorta are structurally normal, with no evidence of dilitation. Venous: The inferior  vena cava is normal in size with greater than 50% respiratory variability, suggesting right atrial pressure of 3 mmHg. IAS/Shunts: No atrial level shunt detected by color flow Doppler. Additional Comments: A device lead is visualized.  LEFT VENTRICLE PLAX 2D LVIDd:         4.70 cm   Diastology LVIDs:         2.80 cm   LV e' medial:    11.80 cm/s LV PW:         1.00 cm   LV E/e' medial:  8.1 LV IVS:        0.90 cm   LV e' lateral:   10.70 cm/s LVOT diam:     1.80 cm   LV E/e' lateral: 9.0 LV SV:         54 LV SV Index:   30 LVOT Area:     2.54 cm  RIGHT VENTRICLE             IVC RV Basal diam:  2.80 cm     IVC diam: 2.00 cm RV S prime:     16.60 cm/s TAPSE (M-mode): 2.3 cm LEFT ATRIUM             Index        RIGHT ATRIUM           Index LA diam:        3.80 cm 2.11 cm/m   RA Area:     14.80 cm LA Vol (A2C):   41.6 ml 23.11 ml/m  RA Volume:   38.80 ml  21.56 ml/m LA Vol (A4C):   45.9 ml 25.50 ml/m LA Biplane Vol: 47.0 ml 26.11 ml/m  AORTIC VALVE             PULMONIC VALVE LVOT Vmax:   91.50 cm/s  PR End Diast Vel: 6.97 msec LVOT Vmean:  60.500 cm/s LVOT VTI:    0.212 m  AORTA Ao Root diam: 2.80 cm Ao Asc diam:  2.80 cm MITRAL VALVE               TRICUSPID VALVE MV Area (PHT): 3.85 cm    TR Peak grad:   49.3 mmHg  MV Decel Time: 197 msec    TR Vmax:        351.00 cm/s MV E velocity: 96.00 cm/s MV A velocity: 89.20 cm/s  SHUNTS MV E/A ratio:  1.08        Systemic VTI:  0.21 m                            Systemic Diam: 1.80 cm Vina Gull MD Electronically signed by Vina Gull MD Signature Date/Time: 02/20/2024/8:14:36 PM    Final     Scheduled Meds:  amLODipine   10 mg Oral Daily   apixaban   10 mg Oral BID   Followed by   NOREEN ON 02/26/2024] apixaban   5 mg Oral BID   azithromycin   500 mg Oral QHS   carvedilol   25 mg Oral BID   fenofibrate   160 mg Oral Daily   fluticasone  furoate-vilanterol  1 puff Inhalation Daily   guaiFENesin   600 mg Oral BID   levothyroxine   75 mcg Oral Q0600   montelukast   10 mg  Oral QHS   Nintedanib   100 mg Oral BID WC   pantoprazole   40 mg Oral Daily   rosuvastatin   40 mg Oral Daily   sertraline   100 mg Oral QHS    Continuous Infusions:  cefTRIAXone  (ROCEPHIN )  IV 2 g (02/20/24 2028)     LOS: 1 day     Lebron JINNY Cage, MD [facility]  If 7PM-7AM, please contact night-coverage [email or web address] 02/21/2024, 1:59 PM

## 2024-02-22 DIAGNOSIS — I2699 Other pulmonary embolism without acute cor pulmonale: Secondary | ICD-10-CM | POA: Diagnosis not present

## 2024-02-22 LAB — CBC
HCT: 29.7 % — ABNORMAL LOW (ref 36.0–46.0)
Hemoglobin: 9.4 g/dL — ABNORMAL LOW (ref 12.0–15.0)
MCH: 30.1 pg (ref 26.0–34.0)
MCHC: 31.6 g/dL (ref 30.0–36.0)
MCV: 95.2 fL (ref 80.0–100.0)
Platelets: 264 K/uL (ref 150–400)
RBC: 3.12 MIL/uL — ABNORMAL LOW (ref 3.87–5.11)
RDW: 13.6 % (ref 11.5–15.5)
WBC: 9.2 K/uL (ref 4.0–10.5)
nRBC: 0 % (ref 0.0–0.2)

## 2024-02-22 LAB — BETA-2-GLYCOPROTEIN I ABS, IGG/M/A
Beta-2 Glyco I IgG: <9 GPI IgG units (ref 0–20)
Beta-2-Glycoprotein I IgA: <9 GPI IgA units (ref 0–25)
Beta-2-Glycoprotein I IgM: <9 GPI IgM units (ref 0–32)

## 2024-02-22 LAB — BASIC METABOLIC PANEL WITH GFR
Anion gap: 9 (ref 5–15)
BUN: 21 mg/dL (ref 8–23)
CO2: 22 mmol/L (ref 22–32)
Calcium: 8.6 mg/dL — ABNORMAL LOW (ref 8.9–10.3)
Chloride: 106 mmol/L (ref 98–111)
Creatinine, Ser: 1.38 mg/dL — ABNORMAL HIGH (ref 0.44–1.00)
GFR, Estimated: 40 mL/min — ABNORMAL LOW (ref >60)
Glucose, Bld: 130 mg/dL — ABNORMAL HIGH (ref 70–99)
Potassium: 4.9 mmol/L (ref 3.5–5.1)
Sodium: 137 mmol/L (ref 135–145)

## 2024-02-22 MED ORDER — CARVEDILOL 12.5 MG PO TABS
12.5000 mg | ORAL_TABLET | Freq: Two times a day (BID) | ORAL | Status: DC
  Administered 2024-02-22 – 2024-02-23 (×3): 12.5 mg via ORAL
  Filled 2024-02-22 (×4): qty 1

## 2024-02-22 NOTE — Progress Notes (Signed)
 PROGRESS NOTE  Kathryn Scott FMW:[id number] DOB: February 05, 1946 DOA: 02/19/2024 PCP: Carvin [name], MD  HPI/Recap of past 24 hours: Kathryn Scott is a 78 y.o. female with medical history significant of asthma, pulmonary fibrosis, HTN, CAD, hypothyroidism, DM2, presents with complaints of worsening cough and R- sided chest pain for a couple of days. In the ED, patient was noted to be afebrile with vital signs fairly stable. Labs significant for WBC 13.4, CO2 18, creatinine 1.31, and troponin <15. Chest x-ray was suspicious for pneumonia.  CTA of the chest noted segmental PE in the right upper lobe without any evidence of right heart strain, new airspace consolidation with groundglass opacity right lower lobe worrisome for pneumonia.  Patient admitted for further management.    Today, patient continues to feel better, had a temp spike of 100.5 on 7/19.  Denies any chest pain, worsening shortness of breath, still coughing.     Assessment/Plan: Principal Problem:   Pulmonary embolism (HCC) Active Problems:   CAP (community acquired pneumonia)   Leukocytosis   IPF (idiopathic pulmonary fibrosis) (HCC)   AKI (acute kidney injury) (HCC)   Normocytic anemia   Essential hypertension   Controlled type 2 diabetes mellitus without complication, without long-term current use of insulin  (HCC)   Acquired hypothyroidism   Acute pulmonary embolism Currently on about 2 L of O2 saturating 97%, plan to wean off (uses O2- 2L, only while driving long distances) CTA of the chest revealed segmental pulmonary embolism in the right upper lobe with no evidence of right heart strain Echo with EF of 65 to 70%, no regional wall motion abnormality, moderately elevated pulmonary artery systolic pressure Doppler negative for DVT Follow-up hypercoagulable panel Continue Eliquis  Zio ordered for 2 weeks, outpatient follow-up with cardiology  Community-acquired pneumonia Last temp spike 100.5 on 7/19,  with resolved leukocytosis Procalcitonin 0.19 Respiratory viral panel negative BC x 2 pending CTA of the chest noted new airspace consolidation with groundglass opacity in the right lower lobe worrisome for pneumonia Continue Rocephin  and azithromycin  Mucinex , incentive spirometry, flutter valve   Interstitial pulmonary fibrosis Asthma without acute exacerbation Patient has a history of asthma and UIP interstitial pulmonary fibrosis and is on Ofev  as well as followed by pulmonology in the outpatient setting Continue Singulair  and Breo Albuterol  nebs as needed for shortness of breath/wheezing Continue outpatient follow-up with pulmonology   Acute kidney injury Creatinine noted to be elevated at 1.31 with BUN 26, baseline around 1.1 per Care Everywhere S/p gentle IV hydration Daily BMP   Normocytic anemia Baseline hemoglobin around 12 Daily CBC   Essential hypertension BP now stable Continue Coreg , hold amlodipine  for now   Controlled diabetes mellitus type 2, without long-term use of insulin  Last A1c 5.5 on 02/03/2024 Patient is currently on metformin and had previously been on Mounjaro but stopped due to side effects Held metformin Monitor CBGs   Hypothyroidism Continue levothyroxine    OSA on CPAP Continue CPAP nightly    Estimated body mass index is 25.19 kg/m as calculated from the following:   Height as of this encounter: 5' 6 (1.676 m).   Weight as of this encounter: 70.8 kg.     Code Status: Full  Family Communication: None at bedside  Disposition Plan: Status is: Inpatient Remains inpatient appropriate because: Level of care      Consultants: None  Procedures: None  Antimicrobials: Ceftriaxone  Azithromycin   DVT prophylaxis: Eliquis    Objective: Vitals:   02/22/24 0418 02/22/24 0635 02/22/24 0800 02/22/24 1204  BP: ROLLEN)  104/42  (!) 117/53 105/60  Pulse: 61 (!) 55 64 63  Resp: 18   18  Temp: 98.5 F (36.9 C)   98.9 F (37.2 C)   TempSrc: Oral   Oral  SpO2: 98% 96% 99% 93%  Weight:      Height:        Intake/Output Summary (Last 24 hours) at 02/22/2024 1323 Last data filed at 02/22/2024 0930 Gross per 24 hour  Intake 240 ml  Output 600 ml  Net -360 ml   Filed Weights   02/19/24 1822 02/20/24 0847  Weight: 68.9 kg 70.8 kg    Exam: General: NAD  Cardiovascular: S1, S2 present Respiratory: Fine bilateral crackles noted Abdomen: Soft, nontender, nondistended, bowel sounds present Musculoskeletal: No bilateral pedal edema noted Skin: Normal Psychiatry: Normal mood     Data Reviewed: CBC: Recent Labs  Lab 02/19/24 1830 02/21/24 0305 02/22/24 0438  WBC 13.4* 9.3 9.2  HGB 10.9* 9.5* 9.4*  HCT 33.7* 29.9* 29.7*  MCV 92.6 93.7 95.2  PLT 274 243 264   Basic Metabolic Panel: Recent Labs  Lab 02/19/24 1830 02/20/24 0939 02/21/24 0305 02/22/24 0438  NA 135 135 137 137  K 4.4 4.0 4.3 4.9  CL 103 102 109 106  CO2 18* 22 21* 22  GLUCOSE 132* 115* 157* 130*  BUN 26* 21 15 21   CREATININE 1.31* 1.35* 1.24* 1.38*  CALCIUM  8.7* 8.9 8.4* 8.6*   GFR: Estimated Creatinine Clearance: 33 mL/min (A) (by C-G formula based on SCr of 1.38 mg/dL (H)). Liver Function Tests: Recent Labs  Lab 02/19/24 1830  AST 29  ALT 19  ALKPHOS 49  BILITOT 0.4  PROT 6.6  ALBUMIN  3.4*   No results for input(s): LIPASE, AMYLASE in the last 168 hours. No results for input(s): AMMONIA in the last 168 hours. Coagulation Profile: No results for input(s): INR, PROTIME in the last 168 hours. Cardiac Enzymes: No results for input(s): CKTOTAL, CKMB, CKMBINDEX, TROPONINI in the last 168 hours. BNP (last 3 results) No results for input(s): PROBNP in the last 8760 hours. HbA1C: No results for input(s): HGBA1C in the last 72 hours. CBG: No results for input(s): GLUCAP in the last 168 hours. Lipid Profile: No results for input(s): CHOL, HDL, LDLCALC, TRIG, CHOLHDL, LDLDIRECT in the last  72 hours. Thyroid Function Tests: Recent Labs    02/20/24 0939  TSH 5.191*   Anemia Panel: No results for input(s): VITAMINB12, FOLATE, FERRITIN, TIBC, IRON, RETICCTPCT in the last 72 hours. Urine analysis:    Component Value Date/Time   COLORURINE STRAW (A) 02/20/2024 2050   APPEARANCEUR CLEAR 02/20/2024 2050   LABSPEC 1.006 02/20/2024 2050   PHURINE 7.0 02/20/2024 2050   GLUCOSEU NEGATIVE 02/20/2024 2050   HGBUR NEGATIVE 02/20/2024 2050   BILIRUBINUR NEGATIVE 02/20/2024 2050   KETONESUR NEGATIVE 02/20/2024 2050   PROTEINUR NEGATIVE 02/20/2024 2050   NITRITE NEGATIVE 02/20/2024 2050   LEUKOCYTESUR SMALL (A) 02/20/2024 2050   Sepsis Labs: @LABRCNTIP (procalcitonin:4,lacticidven:4)  ) Recent Results (from the past 240 hours)  Respiratory (~20 pathogens) panel by PCR     Status: None   Collection Time: 02/21/24  7:38 AM   Specimen: Nasopharyngeal Swab; Respiratory  Result Value Ref Range Status   Adenovirus NOT DETECTED NOT DETECTED Final   Coronavirus 229E NOT DETECTED NOT DETECTED Final    Comment: (NOTE) The Coronavirus on the Respiratory Panel, DOES NOT test for the novel  Coronavirus (2019 nCoV)    Coronavirus HKU1 NOT DETECTED NOT DETECTED Final  Coronavirus NL63 NOT DETECTED NOT DETECTED Final   Coronavirus OC43 NOT DETECTED NOT DETECTED Final   Metapneumovirus NOT DETECTED NOT DETECTED Final   Rhinovirus / Enterovirus NOT DETECTED NOT DETECTED Final   Influenza A NOT DETECTED NOT DETECTED Final   Influenza B NOT DETECTED NOT DETECTED Final   Parainfluenza Virus 1 NOT DETECTED NOT DETECTED Final   Parainfluenza Virus 2 NOT DETECTED NOT DETECTED Final   Parainfluenza Virus 3 NOT DETECTED NOT DETECTED Final   Parainfluenza Virus 4 NOT DETECTED NOT DETECTED Final   Respiratory Syncytial Virus NOT DETECTED NOT DETECTED Final   Bordetella pertussis NOT DETECTED NOT DETECTED Final   Bordetella Parapertussis NOT DETECTED NOT DETECTED Final    Chlamydophila pneumoniae NOT DETECTED NOT DETECTED Final   Mycoplasma pneumoniae NOT DETECTED NOT DETECTED Final    Comment: Performed at [facility], [address], [place], [place] [address]      Studies: No results found.   Scheduled Meds:  apixaban   10 mg Oral BID   Followed by   NOREEN ON 02/26/2024] apixaban   5 mg Oral BID   azithromycin   500 mg Oral QHS   carvedilol   12.5 mg Oral BID WC   fenofibrate   160 mg Oral Daily   fluticasone  furoate-vilanterol  1 puff Inhalation Daily   guaiFENesin   600 mg Oral BID   levothyroxine   75 mcg Oral Q0600   montelukast   10 mg Oral QHS   Nintedanib   100 mg Oral BID WC   pantoprazole   40 mg Oral Daily   rosuvastatin   40 mg Oral Daily   sertraline   100 mg Oral QHS    Continuous Infusions:  cefTRIAXone  (ROCEPHIN )  IV Stopped (02/22/24 0746)     LOS: 2 days     Lebron JINNY Cage, MD [facility]  If 7PM-7AM, please contact night-coverage [email or web address] 02/22/2024, 1:23 PM

## 2024-02-22 NOTE — Plan of Care (Signed)
  Problem: Clinical Measurements: Goal: Cardiovascular complication will be avoided Outcome: Progressing   Problem: Activity: Goal: Risk for activity intolerance will decrease Outcome: Progressing   Problem: Nutrition: Goal: Adequate nutrition will be maintained Outcome: Progressing   Problem: Safety: Goal: Ability to remain free from injury will improve Outcome: Progressing

## 2024-02-23 ENCOUNTER — Inpatient Hospital Stay

## 2024-02-23 ENCOUNTER — Other Ambulatory Visit (HOSPITAL_COMMUNITY): Payer: Self-pay

## 2024-02-23 DIAGNOSIS — I2699 Other pulmonary embolism without acute cor pulmonale: Secondary | ICD-10-CM

## 2024-02-23 DIAGNOSIS — R0609 Other forms of dyspnea: Secondary | ICD-10-CM

## 2024-02-23 DIAGNOSIS — I493 Ventricular premature depolarization: Secondary | ICD-10-CM

## 2024-02-23 DIAGNOSIS — I498 Other specified cardiac arrhythmias: Secondary | ICD-10-CM

## 2024-02-23 LAB — CBC WITH DIFFERENTIAL/PLATELET
Abs Immature Granulocytes: 0.04 K/uL (ref 0.00–0.07)
Basophils Absolute: 0.1 K/uL (ref 0.0–0.1)
Basophils Relative: 1 %
Eosinophils Absolute: 0.2 K/uL (ref 0.0–0.5)
Eosinophils Relative: 2 %
HCT: 29.1 % — ABNORMAL LOW (ref 36.0–46.0)
Hemoglobin: 9.3 g/dL — ABNORMAL LOW (ref 12.0–15.0)
Immature Granulocytes: 0 %
Lymphocytes Relative: 19 %
Lymphs Abs: 1.8 K/uL (ref 0.7–4.0)
MCH: 30 pg (ref 26.0–34.0)
MCHC: 32 g/dL (ref 30.0–36.0)
MCV: 93.9 fL (ref 80.0–100.0)
Monocytes Absolute: 0.8 K/uL (ref 0.1–1.0)
Monocytes Relative: 8 %
Neutro Abs: 6.7 K/uL (ref 1.7–7.7)
Neutrophils Relative %: 70 %
Platelets: 290 K/uL (ref 150–400)
RBC: 3.1 MIL/uL — ABNORMAL LOW (ref 3.87–5.11)
RDW: 13.5 % (ref 11.5–15.5)
WBC: 9.6 K/uL (ref 4.0–10.5)
nRBC: 0 % (ref 0.0–0.2)

## 2024-02-23 LAB — BASIC METABOLIC PANEL WITH GFR
Anion gap: 10 (ref 5–15)
BUN: 20 mg/dL (ref 8–23)
CO2: 22 mmol/L (ref 22–32)
Calcium: 8.6 mg/dL — ABNORMAL LOW (ref 8.9–10.3)
Chloride: 107 mmol/L (ref 98–111)
Creatinine, Ser: 1.27 mg/dL — ABNORMAL HIGH (ref 0.44–1.00)
GFR, Estimated: 44 mL/min — ABNORMAL LOW (ref >60)
Glucose, Bld: 136 mg/dL — ABNORMAL HIGH (ref 70–99)
Potassium: 4.9 mmol/L (ref 3.5–5.1)
Sodium: 139 mmol/L (ref 135–145)

## 2024-02-23 MED ORDER — CARVEDILOL 6.25 MG PO TABS
6.2500 mg | ORAL_TABLET | Freq: Two times a day (BID) | ORAL | 0 refills | Status: DC
  Filled 2024-02-23: qty 60, 30d supply, fill #0

## 2024-02-23 NOTE — Evaluation (Signed)
 Occupational Therapy Evaluation Patient Details Name: Senita Corredor MRN: [id number] DOB: February 27, 1946 Today's Date: 02/23/2024   History of Present Illness   Denisha Hoel is a 78 y.o. female admitted 7/17 with SOB.  Chest x-ray was suspicious for pneumonia.  CTA of the chest noted segmental PE in the right upper lobe without any evidence of right heart strain, new airspace consolidation with groundglass opacity right lower lobe worrisome for pneumonia. PMH: asthma, pulmonary fibrosis, HTN, CAD, hypothyroidism, DM2,     Clinical Impressions Pt is at baseline Ind - mod I level of function with ADLs and ADL mobility. PTA pt lives with her husband and he helps some physically, pt does some cooking and all cleaning, groceries, grab bars in shower are suction and not great; Oxygen prn PTA, has CPAP - wears at night, pulse ox.  Pt educated on energy conservation strategies, tub bench for home use; handout provided.Ind with ADLs/selfcare. All education completed and no further acute OT services are indicated at this time. Pt eager to d/c home today     If plan is discharge home, recommend the following:    Tub bench     Functional Status Assessment   Patient has not had a recent decline in their functional status     Equipment Recommendations   Tub/shower bench     Recommendations for Other Services         Precautions/Restrictions   Precautions Precautions: Fall Restrictions Weight Bearing Restrictions Per Provider Order: No     Mobility Bed Mobility               General bed mobility comments: pt in chair    Transfers Overall transfer level: Independent Equipment used: None                      Balance Overall balance assessment: No apparent balance deficits (not formally assessed)                                         ADL either performed or assessed with clinical judgement   ADL Overall ADL's : Independent;At  baseline                                       General ADL Comments: pt educated on energy conservation strategies, tub bench for home use; handout provided     Vision Baseline Vision/History: 1 Wears glasses Ability to See in Adequate Light: 0 Adequate Patient Visual Report: No change from baseline       Perception         Praxis         Pertinent Vitals/Pain Pain Assessment Pain Assessment: No/denies pain     Extremity/Trunk Assessment Upper Extremity Assessment Upper Extremity Assessment: Overall WFL for tasks assessed   Lower Extremity Assessment Lower Extremity Assessment: Defer to PT evaluation       Communication Communication Communication: No apparent difficulties   Cognition Arousal: Alert Behavior During Therapy: WFL for tasks assessed/performed Cognition: No apparent impairments                               Following commands: Intact       Cueing  General Comments  3L O2 95% at rest, 86% with activity   Exercises     Shoulder Instructions      Home Living Family/patient expects to be discharged to:: Private residence Living Arrangements: Spouse/significant other Available Help at Discharge: Family;Available 24 hours/day Type of Home: House       Home Layout: One level     Bathroom Shower/Tub: Producer, television/film/video: Standard     Home Equipment: Grab bars - tub/shower;Other (comment)   Additional Comments: Oxygen prn PTA, has CPAP - wears at night, pulse oximeter      Prior Functioning/Environment Prior Level of Function : Independent/Modified Independent;Driving             Mobility Comments: Ind ADLs Comments: Ind with ADLs/selfcare.Spouse helps some physically, pt does some cooking and all cleaning, groceries    OT Problem List: Decreased activity tolerance;Decreased knowledge of use of DME or AE   OT Treatment/Interventions:        OT Goals(Current goals can be  found in the care plan section)   Acute Rehab OT Goals Patient Stated Goal: go home   OT Frequency:       Co-evaluation              AM-PAC OT 6 Clicks Daily Activity     Outcome Measure Help from another person eating meals?: None Help from another person taking care of personal grooming?: None Help from another person toileting, which includes using toliet, bedpan, or urinal?: None Help from another person bathing (including washing, rinsing, drying)?: None Help from another person to put on and taking off regular upper body clothing?: None Help from another person to put on and taking off regular lower body clothing?: None 6 Click Score: 24   End of Session Nurse Communication: Mobility status  Activity Tolerance: Patient tolerated treatment well Patient left: in chair;with call bell/phone within reach;with family/visitor present;with nursing/sitter in room  OT Visit Diagnosis: Muscle weakness (generalized) (M62.81)                Time: 8960-8894 OT Time Calculation (min): 26 min Charges:  OT General Charges $OT Visit: 1 Visit OT Evaluation $OT Eval Low Complexity: 1 Low OT Treatments $Self Care/Home Management : 8-22 mins   Jacques Karna Loose 02/23/2024, 1:34 PM

## 2024-02-23 NOTE — Progress Notes (Signed)
 SATURATION QUALIFICATIONS: (This note is used to comply with regulatory documentation for home oxygen)  Patient Saturations on Room Air at Rest = 86%  Patient Saturations on Room Air while Ambulating =NT as pt desaturates on RA at rest  Patient Saturations on 4 Liters of oxygen while Ambulating = 88%  Please briefly explain why patient needs home oxygen:Pt requires 3LO2 at rest and 4LO2 with activity to maintain sats > 88%. Amamda Curbow M,PT Acute Rehab Services [phone]

## 2024-02-23 NOTE — Plan of Care (Signed)
  Problem: Clinical Measurements: Goal: Cardiovascular complication will be avoided Outcome: Progressing   Problem: Nutrition: Goal: Adequate nutrition will be maintained Outcome: Progressing   Problem: Safety: Goal: Ability to remain free from injury will improve Outcome: Progressing   

## 2024-02-23 NOTE — TOC Transition Note (Signed)
 Transition of Care [facility]) - Discharge Note   Patient Details  Name: Kathryn Scott MRN: [id number] Date of Birth: [birth year]  [facility] CM/SW Contact:  Rosalva Jon Bloch, RN Phone Number: 02/23/2024, 10:21 AM   Clinical Narrative:    Patient will DC to: home Anticipated DC date: 02/23/2024 Family notified: yes Transport by: car        - Pulmonary embolism  Per MD patient ready for DC today. RN, patient, patient's husband aware of d/c plan.  Pt agreeable to home health services and DME if needed. Pt states preference for home health provider , [facility] for HHPT and [facility] for DME oxygen need. Referral will be made to both pending MD's orders. Pt without RX med concerns. TOC pharmacy to provide med for pick up prior to d/c. Post hospital f/u noted on AVS.  Husband to provide transportation to home.  RNCM will continue to monitor ....   Final next level of care: Home w Home Health Services (vs outpatient pulmonary rehab) Barriers to Discharge: No Barriers Identified   Patient Goals and CMS Choice     Choice offered to / list presented to : Patient      Discharge Placement                       Discharge Plan and Services Additional resources added to the After Visit Summary for                  DME Arranged: Oxygen   Date DME Agency Contacted: 02/23/24     HH Arranged: PT HH Agency: Other - See comment ([facility])        Social Drivers of Health (SDOH) Interventions SDOH Screenings   Food Insecurity: No Food Insecurity (02/20/2024)  Housing: Low Risk  (02/20/2024)  Transportation Needs: No Transportation Needs (02/20/2024)  Utilities: Not At Risk (02/20/2024)  Financial Resource Strain: Medium Risk (09/13/2023)   Received from [facility]  Physical Activity: Unknown (09/13/2023)   Received from [facility]  Recent Concern: Physical Activity - Inactive (09/13/2023)   [facility]  Social Connections: Moderately  Integrated (02/20/2024)  Stress: Stress Concern Present (09/13/2023)   [facility]  Tobacco Use: Medium Risk (02/19/2024)     Readmission Risk Interventions     No data to display

## 2024-02-23 NOTE — Discharge Summary (Addendum)
 Physician Discharge Summary   Patient: Kathryn Scott MRN: [id number] DOB: [birth year]  Admit date:     02/19/2024  Discharge date: 02/23/24  Discharge Physician: Lebron JINNY Cage   PCP: Carvin [name], MD   Recommendations at discharge:   Follow-up with PCP in 1 week Follow-up with pulmonary as scheduled on 03/01/2024 Follow-up with cardiology as scheduled on 03/24/2024   Discharge Diagnoses: Principal Problem:   Pulmonary embolism (HCC) Active Problems:   CAP (community acquired pneumonia)   Leukocytosis   IPF (idiopathic pulmonary fibrosis) (HCC)   AKI (acute kidney injury) (HCC)   Normocytic anemia   Essential hypertension   Controlled type 2 diabetes mellitus without complication, without long-term current use of insulin  [facility])   Acquired hypothyroidism    Hospital Course: Kathryn Scott is a 78 y.o. female with medical history significant of asthma, pulmonary fibrosis, HTN, CAD, hypothyroidism, DM2, presents with complaints of worsening cough and R- sided chest pain for a couple of days. In the ED, patient was noted to be afebrile with vital signs fairly stable. Labs significant for WBC 13.4, CO2 18, creatinine 1.31, and troponin <15. Chest x-ray was suspicious for pneumonia.  CTA of the chest noted segmental PE in the right upper lobe without any evidence of right heart strain, new airspace consolidation with groundglass opacity right lower lobe worrisome for pneumonia.  Patient admitted for further management.    Today, patient continues to feel better, denies any new complaints, denies any worsening shortness of breath, chest pain, abdominal pain, nausea/vomiting.  No further temp spike.  Patient very eager to be discharged home.    Assessment and Plan:  Acute pulmonary embolism Currently on about 2 L of O2 saturating well, sats drop upon ambulation/with activity.  Advised patient to wear home O2 continuously until significant improvement given new PE as  well as pneumonia and underlying interstitial lung disease. (uses O2- 2L, mostly while driving long distances) CTA of the chest revealed segmental pulmonary embolism in the right upper lobe with no evidence of right heart strain Echo with EF of 65 to 70%, no regional wall motion abnormality, moderately elevated pulmonary artery systolic pressure Doppler negative for DVT PCP to follow-up hypercoagulable panel Continue Eliquis  Zio ordered for 2 weeks, outpatient follow-up with cardiology Follow-up with PCP  Patient Saturations on Room Air at Rest = 86%  Patient Saturations on Room Air while Ambulating =NT as pt desaturates on RA at rest  Patient Saturations on 4 Liters of oxygen while Ambulating = 88%  Please briefly explain why patient needs home oxygen:Pt requires 3LO2 at rest and 4LO2 with activity to maintain sats > 88%.    Community-acquired pneumonia Last temp spike 100.5 on 7/19, with resolved leukocytosis Procalcitonin 0.19 Respiratory viral panel negative BC x 2 NGTD CTA of the chest noted new airspace consolidation with groundglass opacity in the right lower lobe worrisome for pneumonia S/p Rocephin  and azithromycin --> p.o. cefdinir , azithromycin  x 5 days Mucinex , incentive spirometry, flutter valve Follow-up with PCP   Interstitial pulmonary fibrosis/pulmonary hypertension Asthma without acute exacerbation Patient has a history of asthma and UIP interstitial pulmonary fibrosis and is on Ofev  as well as followed by pulmonology in the outpatient setting Continue Singulair  and Breo Albuterol  nebs as needed for shortness of breath/wheezing Continue outpatient follow-up with pulmonology on 03/01/2024   Acute kidney injury Creatinine noted to be elevated at 1.31 with BUN 26, baseline around 1.1 per Care Everywhere S/p gentle IV hydration Follow-up with PCP   Normocytic anemia Baseline  hemoglobin around 12   Essential hypertension Noted episodes of bradycardia BP  stable Decreased Coreg  dose from 25 twice daily to 6.25 twice daily due to episodes of bradycardia, can ultimately discontinue if persistent.  Continue amlodipine  10 mg PCP to follow-up to make adjustments pending BP readings   Controlled diabetes mellitus type 2, without long-term use of insulin  Last A1c 5.5 on 02/03/2024 Continue metformin   Hypothyroidism Continue levothyroxine    OSA on CPAP Continue CPAP nightly    Consultants: None Procedures performed: None Disposition: Home health Diet recommendation:  Cardiac and Carb modified diet    DISCHARGE MEDICATION: Allergies as of 02/23/2024       Reactions   Lisinopril Swelling, Other (See Comments)   Swelling/systemic reaction.   Penicillins Hives   Sulfa Antibiotics Hives, Itching   Red/itching   Clindamycin/lincomycin Hives        Medication List     STOP taking these medications    Mounjaro 5 MG/0.5ML Pen Generic drug: tirzepatide       TAKE these medications    albuterol  108 (90 Base) MCG/ACT inhaler Commonly known as: VENTOLIN  HFA Inhale 2 puffs into the lungs every 6 (six) hours as needed for wheezing or shortness of breath.   amLODipine  10 MG tablet Commonly known as: NORVASC  Take 10 mg by mouth daily.   azithromycin  500 MG tablet Commonly known as: Zithromax  Take 1 tablet (500 mg total) by mouth daily for 3 days.   Breo Ellipta  200-25 MCG/ACT Aepb Generic drug: fluticasone  furoate-vilanterol USE 1 INHALATION DAILY What changed: See the new instructions.   carvedilol  6.25 MG tablet Commonly known as: COREG  Take 1 tablet (6.25 mg total) by mouth 2 (two) times daily. What changed:  medication strength how much to take   cefdinir  300 MG capsule Commonly known as: OMNICEF  Take 1 capsule (300 mg total) by mouth 2 (two) times daily for 3 days.   Cholecalciferol 50 MCG (2000 UT) Caps Take 2,000 Units by mouth daily at 12 noon.   Eliquis  DVT/PE Starter Pack Generic drug: Apixaban  Starter  Pack (10mg  and 5mg ) Take as directed on package: start with two-5mg  tablets twice daily for 7 days. On day 8, switch to one-5mg  tablet twice daily.   Fenofibrate  120 MG Tabs Take 1 tablet (120 mg total) by mouth daily.   ferrous sulfate 325 (65 FE) MG tablet Take 325 mg by mouth daily with breakfast.   guaiFENesin  600 MG 12 hr tablet Commonly known as: MUCINEX  Take 1 tablet (600 mg total) by mouth 2 (two) times daily for 5 days.   levothyroxine  75 MCG tablet Commonly known as: SYNTHROID  Take 75 mcg by mouth daily before breakfast.   Magnesium  400 MG Caps Take 400 mg by mouth daily at 12 noon.   metFORMIN 500 MG 24 hr tablet Commonly known as: GLUCOPHAGE-XR Take 1,500 mg by mouth daily with breakfast.   montelukast  10 MG tablet Commonly known as: SINGULAIR  TAKE 1 TABLET AT BEDTIME   multivitamin with minerals Tabs tablet Take 1 tablet by mouth daily.   Ofev  100 MG Caps Generic drug: Nintedanib  TAKE 1 CAPSULE BY MOUTH 2 TIMES A DAY WITH FOOD. What changed: See the new instructions.   omeprazole 40 MG capsule Commonly known as: PRILOSEC Take 40 mg by mouth in the morning and at bedtime.   rosuvastatin  40 MG tablet Commonly known as: CRESTOR  Take 1 tablet (40 mg total) by mouth daily.   sertraline  100 MG tablet Commonly known as: ZOLOFT  Take 100  mg by mouth at bedtime.   Vascepa  1 g capsule Generic drug: icosapent  Ethyl Take 2 capsules (2 g total) by mouth 2 (two) times daily.        Follow-up Information     Carvin [name], MD. Schedule an appointment as soon as possible for a visit.   Specialty: Internal Medicine Contact information: [address] [place] [address] [place] [address] [phone]                Discharge Exam: Fredricka Weights   02/19/24 1822 02/20/24 0847  Weight: 68.9 kg 70.8 kg   General: NAD  Cardiovascular: S1, S2 present Respiratory: Dry inspiratory crackles Abdomen: Soft, nontender,  nondistended, bowel sounds present Musculoskeletal: No bilateral pedal edema noted Skin: Normal Psychiatry: Normal mood   Condition at discharge: stable  The results of significant diagnostics from this hospitalization (including imaging, microbiology, ancillary and laboratory) are listed below for reference.   Imaging Studies: VAS US  LOWER EXTREMITY VENOUS (DVT) Result Date: 02/21/2024  Lower Venous DVT Study Patient Name:  MAYRANI KHAMIS  Date of Exam:   02/20/2024 Medical Rec #: [id number]       Accession #:    [id number] Date of Birth: [birth year]      Patient Gender: F Patient Age:   28 years Exam Location:  [facility] Procedure:      VAS US  LOWER EXTREMITY VENOUS (DVT) Referring Phys: MICAELA SUNDIL --------------------------------------------------------------------------------  Indications: Swelling, and Edema.  Performing Technologist: Elmarie Lindau, RVT  Examination Guidelines: A complete evaluation includes B-mode imaging, spectral Doppler, color Doppler, and power Doppler as needed of all accessible portions of each vessel. Bilateral testing is considered an integral part of a complete examination. Limited examinations for reoccurring indications may be performed as noted. The reflux portion of the exam is performed with the patient in reverse Trendelenburg.  +---------+---------------+---------+-----------+----------+--------------+ RIGHT    CompressibilityPhasicitySpontaneityPropertiesThrombus Aging +---------+---------------+---------+-----------+----------+--------------+ CFV      Full           Yes      Yes                                 +---------+---------------+---------+-----------+----------+--------------+ SFJ      Full                                                        +---------+---------------+---------+-----------+----------+--------------+ FV Prox  Full                                                         +---------+---------------+---------+-----------+----------+--------------+ FV Mid   Full                                                        +---------+---------------+---------+-----------+----------+--------------+ FV DistalFull                                                        +---------+---------------+---------+-----------+----------+--------------+  PFV      Full                                                        +---------+---------------+---------+-----------+----------+--------------+ POP      Full           Yes      Yes                                 +---------+---------------+---------+-----------+----------+--------------+ PTV      Full                                                        +---------+---------------+---------+-----------+----------+--------------+ PERO     Full                                                        +---------+---------------+---------+-----------+----------+--------------+   +---------+---------------+---------+-----------+----------+--------------+ LEFT     CompressibilityPhasicitySpontaneityPropertiesThrombus Aging +---------+---------------+---------+-----------+----------+--------------+ CFV      Full           Yes      Yes                                 +---------+---------------+---------+-----------+----------+--------------+ SFJ      Full                                                        +---------+---------------+---------+-----------+----------+--------------+ FV Prox  Full                                                        +---------+---------------+---------+-----------+----------+--------------+ FV Mid   Full                                                        +---------+---------------+---------+-----------+----------+--------------+ FV DistalFull                                                         +---------+---------------+---------+-----------+----------+--------------+ PFV      Full                                                        +---------+---------------+---------+-----------+----------+--------------+  POP      Full           Yes      Yes                                 +---------+---------------+---------+-----------+----------+--------------+ PTV      Full                                                        +---------+---------------+---------+-----------+----------+--------------+ PERO     Full                                                        +---------+---------------+---------+-----------+----------+--------------+     Summary: RIGHT: - There is no evidence of deep vein thrombosis in the lower extremity.  - No cystic structure found in the popliteal fossa.  LEFT: - There is no evidence of deep vein thrombosis in the lower extremity.  - No cystic structure found in the popliteal fossa.  *See table(s) above for measurements and observations. Electronically signed by Fonda Rim on 02/21/2024 at 9:55:35 AM.    Final    ECHOCARDIOGRAM COMPLETE Result Date: 02/20/2024    ECHOCARDIOGRAM REPORT   Patient Name:   HENLEIGH ROBELLO Date of Exam: 02/20/2024 Medical Rec #:  [id number]      Height:       66.0 in Accession #:    [id number]     Weight:       156.1 lb Date of Birth:  [birth year]     BSA:          1.800 m Patient Age:    77 years       BP:           127/61 mmHg Patient Gender: F              HR:           62 bpm. Exam Location:  Inpatient Procedure: 2D Echo (Both Spectral and Color Flow Doppler were utilized during            procedure). Indications:    pulmonary embolus  History:        Patient has prior history of Echocardiogram examinations, most                 recent 08/25/2023. CAD; Risk Factors:Hypertension, Dyslipidemia                 and Diabetes.  Sonographer:    Tinnie Barefoot RDCS Referring Phys: [id number] SUBRINA SUNDIL IMPRESSIONS  1.  Prominent moderator band in RV (images 35, 51). Left ventricular ejection fraction, by estimation, is 65 to 70%. The left ventricle has normal function. The left ventricle has no regional wall motion abnormalities.  2. Right ventricular systolic function is normal. The right ventricular size is normal. There is moderately elevated pulmonary artery systolic pressure.  3. The mitral valve is normal in structure. Trivial mitral valve regurgitation.  4. The aortic valve is tricuspid. Aortic valve regurgitation is trivial.  5. The inferior vena cava is normal in size with greater than 50% respiratory  variability, suggesting right atrial pressure of 3 mmHg. Comparison(s): The left ventricular function is unchanged. FINDINGS  Left Ventricle: Prominent moderator band in RV (images 35, 51). Left ventricular ejection fraction, by estimation, is 65 to 70%. The left ventricle has normal function. The left ventricle has no regional wall motion abnormalities. The left ventricular internal cavity size was normal in size. There is no left ventricular hypertrophy. Right Ventricle: The right ventricular size is normal. Right vetricular wall thickness was not assessed. Right ventricular systolic function is normal. There is moderately elevated pulmonary artery systolic pressure. The tricuspid regurgitant velocity is  3.51 m/s, and with an assumed right atrial pressure of 3 mmHg, the estimated right ventricular systolic pressure is 52.3 mmHg. Left Atrium: Left atrial size was normal in size. Right Atrium: Right atrial size was normal in size. Pericardium: There is no evidence of pericardial effusion. Mitral Valve: The mitral valve is normal in structure. Trivial mitral valve regurgitation. Tricuspid Valve: The tricuspid valve is normal in structure. Tricuspid valve regurgitation is trivial. Aortic Valve: The aortic valve is tricuspid. Aortic valve regurgitation is trivial. Pulmonic Valve: The pulmonic valve was normal in structure.  Pulmonic valve regurgitation is mild. Aorta: The aortic root and ascending aorta are structurally normal, with no evidence of dilitation. Venous: The inferior vena cava is normal in size with greater than 50% respiratory variability, suggesting right atrial pressure of 3 mmHg. IAS/Shunts: No atrial level shunt detected by color flow Doppler. Additional Comments: A device lead is visualized.  LEFT VENTRICLE PLAX 2D LVIDd:         4.70 cm   Diastology LVIDs:         2.80 cm   LV e' medial:    11.80 cm/s LV PW:         1.00 cm   LV E/e' medial:  8.1 LV IVS:        0.90 cm   LV e' lateral:   10.70 cm/s LVOT diam:     1.80 cm   LV E/e' lateral: 9.0 LV SV:         54 LV SV Index:   30 LVOT Area:     2.54 cm  RIGHT VENTRICLE             IVC RV Basal diam:  2.80 cm     IVC diam: 2.00 cm RV S prime:     16.60 cm/s TAPSE (M-mode): 2.3 cm LEFT ATRIUM             Index        RIGHT ATRIUM           Index LA diam:        3.80 cm 2.11 cm/m   RA Area:     14.80 cm LA Vol (A2C):   41.6 ml 23.11 ml/m  RA Volume:   38.80 ml  21.56 ml/m LA Vol (A4C):   45.9 ml 25.50 ml/m LA Biplane Vol: 47.0 ml 26.11 ml/m  AORTIC VALVE             PULMONIC VALVE LVOT Vmax:   91.50 cm/s  PR End Diast Vel: 6.97 msec LVOT Vmean:  60.500 cm/s LVOT VTI:    0.212 m  AORTA Ao Root diam: 2.80 cm Ao Asc diam:  2.80 cm MITRAL VALVE               TRICUSPID VALVE MV Area (PHT): 3.85 cm    TR Peak grad:   49.3 mmHg  MV Decel Time: 197 msec    TR Vmax:        351.00 cm/s MV E velocity: 96.00 cm/s MV A velocity: 89.20 cm/s  SHUNTS MV E/A ratio:  1.08        Systemic VTI:  0.21 m                            Systemic Diam: 1.80 cm Vina Gull MD Electronically signed by Vina Gull MD Signature Date/Time: 02/20/2024/8:14:36 PM    Final    CT Angio Chest PE W and/or Wo Contrast Result Date: 02/19/2024 CLINICAL DATA:  High probability for PE.  Shortness of breath. EXAM: CT ANGIOGRAPHY CHEST WITH CONTRAST TECHNIQUE: Multidetector CT imaging of the chest was  performed using the standard protocol during bolus administration of intravenous contrast. Multiplanar CT image reconstructions and MIPs were obtained to evaluate the vascular anatomy. RADIATION DOSE REDUCTION: This exam was performed according to the departmental dose-optimization program which includes automated exposure control, adjustment of the mA and/or kV according to patient size and/or use of iterative reconstruction technique. CONTRAST:  60mL OMNIPAQUE  IOHEXOL  350 MG/ML SOLN COMPARISON:  CT of the chest 07/25/2023. FINDINGS: Cardiovascular: There is a segmental pulmonary embolism in the right upper lobe image 10/62. No other pulmonary emboli are seen. The heart is enlarged. Aorta is normal in size. There are atherosclerotic calcifications of the aorta. Mediastinum/Nodes: There are nonenlarged and mildly enlarged paratracheal lymph nodes measuring up to 11 mm. Visualized thyroid gland and esophagus are within normal limits. There is a small hiatal hernia. Lungs/Pleura: There is a small right pleural effusion. Peripheral reticular opacities and honeycombing again seen throughout both lungs, progressed compared to 2024. There is new airspace consolidation and ground-glass opacities in the posterior and inferior right upper lobe. No pneumothorax visualized. Upper Abdomen: No acute abnormality. Musculoskeletal: No chest wall abnormality. No acute or significant osseous findings. Review of the MIP images confirms the above findings. IMPRESSION: 1. Segmental pulmonary embolism in the right upper lobe. No evidence for right heart strain. 2. New airspace consolidation and ground-glass opacities in the right upper lobe worrisome for pneumonia. 3. Small right pleural effusion. 4. Progressive interstitial lung disease. 5. Cardiomegaly. 6. Aortic atherosclerosis. Aortic Atherosclerosis (ICD10-I70.0). Electronically Signed   By: Greig Pique M.D.   On: 02/19/2024 21:04   DG Chest 2 View Result Date: 02/19/2024 EXAM:  2 VIEW(S) XRAY OF THE CHEST 02/19/2024 06:58:00 PM COMPARISON: Radiographs August 23, 2021 and CT chest July 25, 2023. CLINICAL HISTORY: CP. Pt has a hx of IP and asthma. Pt says she was carrying her grandson and might have pulled something. Pt reports CP and lower back pain that is progressively getting worse. Visible SOB that is more than usual per pt. FINDINGS: LUNGS AND PLEURA: Airspace opacity in the right mid lung suspicious for pneumonia. Follow up in 6-8 weeks after treatment is recommended to ensure resolution. Diffuse bronchial wall thickening and interstitial coarsening. No pleural effusion or pneumothorax. HEART AND MEDIASTINUM: Stable cardiomediastinal silhouette. Aortic atherosclerotic calcification. BONES AND SOFT TISSUES: No acute osseous abnormality. IMPRESSION: 1. Airspace opacity in the right mid lung suspicious for pneumonia. Follow-up in 8 weeks after treatment is recommended to ensure resolution. 2. Diffuse bronchial wall thickening and interstitial coarsening likely related to underlying interstitial lung disease . Electronically signed by: Norman Gatlin MD 02/19/2024 07:32 PM EDT RP Workstation: [id number]    Microbiology: Results for orders placed or performed during the hospital  encounter of 02/19/24  Respiratory (~20 pathogens) panel by PCR     Status: None   Collection Time: 02/21/24  7:38 AM   Specimen: Nasopharyngeal Swab; Respiratory  Result Value Ref Range Status   Adenovirus NOT DETECTED NOT DETECTED Final   Coronavirus 229E NOT DETECTED NOT DETECTED Final    Comment: (NOTE) The Coronavirus on the Respiratory Panel, DOES NOT test for the novel  Coronavirus (2019 nCoV)    Coronavirus HKU1 NOT DETECTED NOT DETECTED Final   Coronavirus NL63 NOT DETECTED NOT DETECTED Final   Coronavirus OC43 NOT DETECTED NOT DETECTED Final   Metapneumovirus NOT DETECTED NOT DETECTED Final   Rhinovirus / Enterovirus NOT DETECTED NOT DETECTED Final   Influenza A NOT DETECTED  NOT DETECTED Final   Influenza B NOT DETECTED NOT DETECTED Final   Parainfluenza Virus 1 NOT DETECTED NOT DETECTED Final   Parainfluenza Virus 2 NOT DETECTED NOT DETECTED Final   Parainfluenza Virus 3 NOT DETECTED NOT DETECTED Final   Parainfluenza Virus 4 NOT DETECTED NOT DETECTED Final   Respiratory Syncytial Virus NOT DETECTED NOT DETECTED Final   Bordetella pertussis NOT DETECTED NOT DETECTED Final   Bordetella Parapertussis NOT DETECTED NOT DETECTED Final   Chlamydophila pneumoniae NOT DETECTED NOT DETECTED Final   Mycoplasma pneumoniae NOT DETECTED NOT DETECTED Final    Comment: Performed at [facility], [address] [place], [place] [address]  Culture, blood (Routine X 2) w Reflex to ID Panel     Status: None (Preliminary result)   Collection Time: 02/22/24  2:48 PM   Specimen: BLOOD LEFT ARM  Result Value Ref Range Status   Specimen Description BLOOD LEFT ARM  Final   Special Requests   Final    BOTTLES DRAWN AEROBIC ONLY Blood Culture adequate volume   Culture   Final    NO GROWTH < 24 HOURS Performed at [facility], [address], [place], [place] [address]    Report Status PENDING  Incomplete  Culture, blood (Routine X 2) w Reflex to ID Panel     Status: None (Preliminary result)   Collection Time: 02/22/24  2:48 PM   Specimen: BLOOD LEFT HAND  Result Value Ref Range Status   Specimen Description BLOOD LEFT HAND  Final   Special Requests   Final    BOTTLES DRAWN AEROBIC ONLY Blood Culture results may not be optimal due to an inadequate volume of blood received in culture bottles   Culture   Final    NO GROWTH < 24 HOURS Performed at [facility], [address], [place], [place] [address]    Report Status PENDING  Incomplete    Labs: CBC: Recent Labs  Lab 02/19/24 1830 02/21/24 0305 02/22/24 0438 02/23/24 0531  WBC 13.4* 9.3 9.2 9.6  NEUTROABS  --   --   --  6.7  HGB 10.9* 9.5* 9.4* 9.3*  HCT 33.7* 29.9* 29.7* 29.1*  MCV 92.6 93.7  95.2 93.9  PLT 274 243 264 290   Basic Metabolic Panel: Recent Labs  Lab 02/19/24 1830 02/20/24 0939 02/21/24 0305 02/22/24 0438 02/23/24 0531  NA 135 135 137 137 139  K 4.4 4.0 4.3 4.9 4.9  CL 103 102 109 106 107  CO2 18* 22 21* 22 22  GLUCOSE 132* 115* 157* 130* 136*  BUN 26* 21 15 21 20   CREATININE 1.31* 1.35* 1.24* 1.38* 1.27*  CALCIUM  8.7* 8.9 8.4* 8.6* 8.6*   Liver Function Tests: Recent Labs  Lab 02/19/24 1830  AST 29  ALT 19  ALKPHOS 49  BILITOT 0.4  PROT 6.6  ALBUMIN  3.4*   CBG: No results for input(s): GLUCAP in the last 168 hours.  Discharge time spent: greater than 30 minutes.  Signed: Lebron JINNY Cage, MD [facility] 02/23/2024

## 2024-02-23 NOTE — TOC Progression Note (Addendum)
 Transition of Care [facility]) - Progression Note    Patient Details  Name: Kathryn Scott MRN: [id number] Date of Birth: [birth year]  Transition of Care [facility]) CM/SW Contact  Rosalva Jon Bloch, RN Phone Number: 02/23/2024, 11:50 AM  Clinical Narrative:     Referral made with Elenor @ [employer]  for home oxygen. DME will be delivered to bedside prior to d/c.  02/23/2024 12:11 pm NCM requested DME oxygen be delivered to d/c lounge per nurse's request..  TOC team monitoring...    Barriers to Discharge: No Barriers Identified  Expected Discharge Plan and Services         Expected Discharge Date: 02/23/24               DME Arranged: Oxygen   Date DME Agency Contacted: 02/23/24 Time DME Agency Contacted: [id number] Representative spoke with at DME Agency: Elenor [phone] [facility] Arranged: PT HH Agency: Other - See comment [facility])         Social Determinants of Health (SDOH) Interventions SDOH Screenings   Food Insecurity: No Food Insecurity (02/20/2024)  Housing: Low Risk  (02/20/2024)  Transportation Needs: No Transportation Needs (02/20/2024)  Utilities: Not At Risk (02/20/2024)  Financial Resource Strain: Medium Risk (09/13/2023)   Received from [facility]  Physical Activity: Unknown (09/13/2023)   Received from [facility]  Recent Concern: Physical Activity - Inactive (09/13/2023)   Received from [facility]  Social Connections: Moderately Integrated (02/20/2024)  Stress: Stress Concern Present (09/13/2023)   Received from [facility]  Tobacco Use: Medium Risk (02/19/2024)    Readmission Risk Interventions     No data to display

## 2024-02-23 NOTE — Progress Notes (Unsigned)
Enrolled for [employer] to mail a ZIO XT long term holter monitor to the patients address on file.   Dr. Rosemary Holms to read.

## 2024-02-23 NOTE — Evaluation (Signed)
 Physical Therapy Brief Evaluation and Discharge Note Patient Details Name: Kathryn Scott MRN: [id number] DOB: 1945-12-03 Today's Date: 02/23/2024   History of Present Illness  Kathryn Scott is a 78 y.o. female admitted 7/17 with SOB.  Chest x-ray was suspicious for pneumonia.  CTA of the chest noted segmental PE in the right upper lobe without any evidence of right heart strain, new airspace consolidation with groundglass opacity right lower lobe worrisome for pneumonia. PMH: asthma, pulmonary fibrosis, HTN, CAD, hypothyroidism, DM2,  Clinical Impression  Pt admitted with above diagnosis. Pt independent with gait without LOB without device however does have O2 needs as below. Updated CM, nurse and MD.  Pt likely going home today.  Will not follow while in hospital secondary to d/c.  Can walk with mobility specialist if pt doesn't d/c today.  No further PT needs.   HHPT vs. Pulmonary Rehab once pt is home.   SATURATION QUALIFICATIONS: (This note is used to comply with regulatory documentation for home oxygen)   Patient Saturations on Room Air at Rest = 86%   Patient Saturations on Room Air while Ambulating =NT as pt desaturates on RA at rest   Patient Saturations on 4 Liters of oxygen while Ambulating = 88%   Please briefly explain why patient needs home oxygen:Pt requires 3LO2 at rest and 4LO2 with activity to maintain sats > 88%. PT Assessment All further PT needs can be met in the next venue of care  Assistance Needed at Discharge  PRN    Equipment Recommendations Other (comment) (home O2)  Recommendations for Other Services       Precautions/Restrictions Precautions Precautions: Fall Restrictions Weight Bearing Restrictions Per Provider Order: No        Mobility  Bed Mobility Rolling: Independent Supine/Sidelying to sit: Independent      Transfers Overall transfer level: Independent                      Ambulation/Gait Ambulation/Gait assistance:  Independent Gait Distance (Feet): 150 Feet Assistive device: None Gait Pattern/deviations: WFL(Within Functional Limits) Gait Speed: Pace WFL General Gait Details: No LOB.  Does desaturate on RA at rest and needs 3LO2 at rest and 4LO2 with activity.  Updated nurse and MD.  Home Activity Instructions    Stairs            Modified Rankin (Stroke Patients Only)        Balance                          Pertinent Vitals/Pain PT - Brief Vital Signs All Vital Signs Stable: Other (comment) (See O2 saturation note, other VSS) Pain Assessment Pain Assessment: No/denies pain     Home Living Family/patient expects to be discharged to:: Private residence Living Arrangements: Spouse/significant other Available Help at Discharge: Family;Available 24 hours/day Home Environment: Stairs to enter  [employer] of Steps: 3 with bil rails Home Equipment: Grab bars - tub/shower (grab bars in shower are suction and not great; Oxygen prn PTA, has CPAP - wears at night,pulse oximeter)   Additional Comments: Spouse helps some physically, pt does some cooking and all cleaning    Prior Function Level of Independence: Independent      UE/LE Assessment   UE ROM/Strength/Tone/Coordination: WFL    LE ROM/Strength/Tone/Coordination: [facility]      Communication   Communication Communication: No apparent difficulties     Cognition Overall Cognitive Status: Appears within functional limits for tasks assessed/performed  General Comments General comments (skin integrity, edema, etc.): 64 bpm, 96% 3LO2, 127/36    Exercises     Assessment/Plan    PT Problem List Decreased activity tolerance;Decreased mobility;Cardiopulmonary status limiting activity       PT Visit Diagnosis Muscle weakness (generalized) (M62.81);Other abnormalities of gait and mobility (R26.89)    No Skilled PT     Co-evaluation                AMPAC 6 Clicks Help needed turning from your  back to your side while in a flat bed without using bedrails?: None Help needed moving from lying on your back to sitting on the side of a flat bed without using bedrails?: None Help needed moving to and from a bed to a chair (including a wheelchair)?: None Help needed standing up from a chair using your arms (e.g., wheelchair or bedside chair)?: None Help needed to walk in hospital room?: None Help needed climbing 3-5 steps with a railing? : None 6 Click Score: 24      End of Session Equipment Utilized During Treatment: Oxygen Activity Tolerance: Patient tolerated treatment well Patient left: in chair;with call bell/phone within reach;with chair alarm set Nurse Communication: Mobility status PT Visit Diagnosis: Muscle weakness (generalized) (M62.81);Other abnormalities of gait and mobility (R26.89)     Time: 9071-9047 PT Time Calculation (min) (ACUTE ONLY): 24 min  Charges:   PT Evaluation $PT Eval Low Complexity: 1 Low PT Treatments $Gait Training: 8-22 mins    [facility] M,PT Acute Rehab Services [phone]   Stephane JULIANNA Bevel  02/23/2024, 10:03 AM

## 2024-02-25 ENCOUNTER — Emergency Department (HOSPITAL_COMMUNITY)

## 2024-02-25 ENCOUNTER — Other Ambulatory Visit: Payer: Self-pay

## 2024-02-25 ENCOUNTER — Ambulatory Visit: Payer: Self-pay | Admitting: Pulmonary Disease

## 2024-02-25 ENCOUNTER — Inpatient Hospital Stay (HOSPITAL_COMMUNITY)
Admission: EM | Admit: 2024-02-25 | Discharge: 2024-04-05 | DRG: 871 | Disposition: E | Attending: Internal Medicine | Admitting: Internal Medicine

## 2024-02-25 DIAGNOSIS — I130 Hypertensive heart and chronic kidney disease with heart failure and stage 1 through stage 4 chronic kidney disease, or unspecified chronic kidney disease: Secondary | ICD-10-CM | POA: Diagnosis present

## 2024-02-25 DIAGNOSIS — N1831 Chronic kidney disease, stage 3a: Secondary | ICD-10-CM | POA: Diagnosis present

## 2024-02-25 DIAGNOSIS — Z6824 Body mass index (BMI) 24.0-24.9, adult: Secondary | ICD-10-CM

## 2024-02-25 DIAGNOSIS — R042 Hemoptysis: Secondary | ICD-10-CM | POA: Diagnosis present

## 2024-02-25 DIAGNOSIS — Z515 Encounter for palliative care: Secondary | ICD-10-CM

## 2024-02-25 DIAGNOSIS — Z8249 Family history of ischemic heart disease and other diseases of the circulatory system: Secondary | ICD-10-CM

## 2024-02-25 DIAGNOSIS — I2699 Other pulmonary embolism without acute cor pulmonale: Secondary | ICD-10-CM | POA: Diagnosis not present

## 2024-02-25 DIAGNOSIS — E1122 Type 2 diabetes mellitus with diabetic chronic kidney disease: Secondary | ICD-10-CM | POA: Diagnosis present

## 2024-02-25 DIAGNOSIS — Z801 Family history of malignant neoplasm of trachea, bronchus and lung: Secondary | ICD-10-CM

## 2024-02-25 DIAGNOSIS — J45909 Unspecified asthma, uncomplicated: Secondary | ICD-10-CM | POA: Diagnosis present

## 2024-02-25 DIAGNOSIS — N179 Acute kidney failure, unspecified: Secondary | ICD-10-CM | POA: Diagnosis present

## 2024-02-25 DIAGNOSIS — T380X5A Adverse effect of glucocorticoids and synthetic analogues, initial encounter: Secondary | ICD-10-CM | POA: Diagnosis present

## 2024-02-25 DIAGNOSIS — G4733 Obstructive sleep apnea (adult) (pediatric): Secondary | ICD-10-CM | POA: Diagnosis present

## 2024-02-25 DIAGNOSIS — R54 Age-related physical debility: Secondary | ICD-10-CM | POA: Diagnosis present

## 2024-02-25 DIAGNOSIS — I272 Pulmonary hypertension, unspecified: Secondary | ICD-10-CM | POA: Diagnosis present

## 2024-02-25 DIAGNOSIS — J90 Pleural effusion, not elsewhere classified: Secondary | ICD-10-CM | POA: Diagnosis present

## 2024-02-25 DIAGNOSIS — Z66 Do not resuscitate: Secondary | ICD-10-CM | POA: Diagnosis present

## 2024-02-25 DIAGNOSIS — R0603 Acute respiratory distress: Secondary | ICD-10-CM | POA: Diagnosis present

## 2024-02-25 DIAGNOSIS — Z8701 Personal history of pneumonia (recurrent): Secondary | ICD-10-CM

## 2024-02-25 DIAGNOSIS — Z7901 Long term (current) use of anticoagulants: Secondary | ICD-10-CM | POA: Diagnosis not present

## 2024-02-25 DIAGNOSIS — Z7984 Long term (current) use of oral hypoglycemic drugs: Secondary | ICD-10-CM

## 2024-02-25 DIAGNOSIS — E43 Unspecified severe protein-calorie malnutrition: Secondary | ICD-10-CM | POA: Diagnosis present

## 2024-02-25 DIAGNOSIS — E872 Acidosis, unspecified: Secondary | ICD-10-CM | POA: Diagnosis present

## 2024-02-25 DIAGNOSIS — Z823 Family history of stroke: Secondary | ICD-10-CM

## 2024-02-25 DIAGNOSIS — Z882 Allergy status to sulfonamides status: Secondary | ICD-10-CM

## 2024-02-25 DIAGNOSIS — Z1152 Encounter for screening for COVID-19: Secondary | ICD-10-CM | POA: Diagnosis not present

## 2024-02-25 DIAGNOSIS — J8410 Pulmonary fibrosis, unspecified: Secondary | ICD-10-CM | POA: Diagnosis not present

## 2024-02-25 DIAGNOSIS — Z87891 Personal history of nicotine dependence: Secondary | ICD-10-CM

## 2024-02-25 DIAGNOSIS — J9621 Acute and chronic respiratory failure with hypoxia: Secondary | ICD-10-CM | POA: Diagnosis present

## 2024-02-25 DIAGNOSIS — Z7189 Other specified counseling: Secondary | ICD-10-CM | POA: Diagnosis not present

## 2024-02-25 DIAGNOSIS — F419 Anxiety disorder, unspecified: Secondary | ICD-10-CM | POA: Diagnosis present

## 2024-02-25 DIAGNOSIS — Z888 Allergy status to other drugs, medicaments and biological substances status: Secondary | ICD-10-CM

## 2024-02-25 DIAGNOSIS — Z79890 Hormone replacement therapy (postmenopausal): Secondary | ICD-10-CM

## 2024-02-25 DIAGNOSIS — I2694 Multiple subsegmental pulmonary emboli without acute cor pulmonale: Secondary | ICD-10-CM | POA: Diagnosis not present

## 2024-02-25 DIAGNOSIS — A419 Sepsis, unspecified organism: Secondary | ICD-10-CM | POA: Diagnosis present

## 2024-02-25 DIAGNOSIS — J189 Pneumonia, unspecified organism: Secondary | ICD-10-CM | POA: Diagnosis present

## 2024-02-25 DIAGNOSIS — Z880 Allergy status to penicillin: Secondary | ICD-10-CM

## 2024-02-25 DIAGNOSIS — Z86711 Personal history of pulmonary embolism: Secondary | ICD-10-CM

## 2024-02-25 DIAGNOSIS — J849 Interstitial pulmonary disease, unspecified: Secondary | ICD-10-CM

## 2024-02-25 DIAGNOSIS — J9620 Acute and chronic respiratory failure, unspecified whether with hypoxia or hypercapnia: Secondary | ICD-10-CM | POA: Diagnosis not present

## 2024-02-25 DIAGNOSIS — R5381 Other malaise: Secondary | ICD-10-CM | POA: Diagnosis not present

## 2024-02-25 DIAGNOSIS — J9601 Acute respiratory failure with hypoxia: Secondary | ICD-10-CM | POA: Diagnosis not present

## 2024-02-25 DIAGNOSIS — E039 Hypothyroidism, unspecified: Secondary | ICD-10-CM | POA: Diagnosis present

## 2024-02-25 DIAGNOSIS — Z833 Family history of diabetes mellitus: Secondary | ICD-10-CM

## 2024-02-25 DIAGNOSIS — J84112 Idiopathic pulmonary fibrosis: Secondary | ICD-10-CM | POA: Diagnosis present

## 2024-02-25 DIAGNOSIS — Z881 Allergy status to other antibiotic agents status: Secondary | ICD-10-CM

## 2024-02-25 DIAGNOSIS — Z79899 Other long term (current) drug therapy: Secondary | ICD-10-CM

## 2024-02-25 DIAGNOSIS — Z7951 Long term (current) use of inhaled steroids: Secondary | ICD-10-CM

## 2024-02-25 LAB — COMPREHENSIVE METABOLIC PANEL WITH GFR
ALT: 18 U/L (ref 0–44)
AST: 29 U/L (ref 15–41)
Albumin: 2.4 g/dL — ABNORMAL LOW (ref 3.5–5.0)
Alkaline Phosphatase: 46 U/L (ref 38–126)
Anion gap: 11 (ref 5–15)
BUN: 14 mg/dL (ref 8–23)
CO2: 20 mmol/L — ABNORMAL LOW (ref 22–32)
Calcium: 8.8 mg/dL — ABNORMAL LOW (ref 8.9–10.3)
Chloride: 106 mmol/L (ref 98–111)
Creatinine, Ser: 1.33 mg/dL — ABNORMAL HIGH (ref 0.44–1.00)
GFR, Estimated: 42 mL/min — ABNORMAL LOW (ref >60)
Glucose, Bld: 167 mg/dL — ABNORMAL HIGH (ref 70–99)
Potassium: 4.8 mmol/L (ref 3.5–5.1)
Sodium: 137 mmol/L (ref 135–145)
Total Bilirubin: 0.5 mg/dL (ref 0.0–1.2)
Total Protein: 6.8 g/dL (ref 6.5–8.1)

## 2024-02-25 LAB — URINALYSIS, W/ REFLEX TO CULTURE (INFECTION SUSPECTED)
Bilirubin Urine: NEGATIVE
Glucose, UA: NEGATIVE mg/dL
Hgb urine dipstick: NEGATIVE
Ketones, ur: 5 mg/dL — AB
Leukocytes,Ua: NEGATIVE
Nitrite: NEGATIVE
Protein, ur: 30 mg/dL — AB
Specific Gravity, Urine: 1.023 (ref 1.005–1.030)
pH: 5 (ref 5.0–8.0)

## 2024-02-25 LAB — I-STAT VENOUS BLOOD GAS, ED
Acid-base deficit: 3 mmol/L — ABNORMAL HIGH (ref 0.0–2.0)
Bicarbonate: 20.5 mmol/L (ref 20.0–28.0)
Calcium, Ion: 1.11 mmol/L — ABNORMAL LOW (ref 1.15–1.40)
HCT: 33 % — ABNORMAL LOW (ref 36.0–46.0)
Hemoglobin: 11.2 g/dL — ABNORMAL LOW (ref 12.0–15.0)
O2 Saturation: 75 %
Potassium: 4.8 mmol/L (ref 3.5–5.1)
Sodium: 140 mmol/L (ref 135–145)
TCO2: 22 mmol/L (ref 22–32)
pCO2, Ven: 32.6 mmHg — ABNORMAL LOW (ref 44–60)
pH, Ven: 7.408 (ref 7.25–7.43)
pO2, Ven: 39 mmHg (ref 32–45)

## 2024-02-25 LAB — CBC WITH DIFFERENTIAL/PLATELET
Abs Immature Granulocytes: 0.09 K/uL — ABNORMAL HIGH (ref 0.00–0.07)
Basophils Absolute: 0.1 K/uL (ref 0.0–0.1)
Basophils Relative: 1 %
Eosinophils Absolute: 0.2 K/uL (ref 0.0–0.5)
Eosinophils Relative: 1 %
HCT: 32.9 % — ABNORMAL LOW (ref 36.0–46.0)
Hemoglobin: 10.3 g/dL — ABNORMAL LOW (ref 12.0–15.0)
Immature Granulocytes: 1 %
Lymphocytes Relative: 7 %
Lymphs Abs: 1.1 K/uL (ref 0.7–4.0)
MCH: 29.9 pg (ref 26.0–34.0)
MCHC: 31.3 g/dL (ref 30.0–36.0)
MCV: 95.4 fL (ref 80.0–100.0)
Monocytes Absolute: 0.9 K/uL (ref 0.1–1.0)
Monocytes Relative: 6 %
Neutro Abs: 13 K/uL — ABNORMAL HIGH (ref 1.7–7.7)
Neutrophils Relative %: 84 %
Platelets: 484 K/uL — ABNORMAL HIGH (ref 150–400)
RBC: 3.45 MIL/uL — ABNORMAL LOW (ref 3.87–5.11)
RDW: 13.8 % (ref 11.5–15.5)
WBC: 15.3 K/uL — ABNORMAL HIGH (ref 4.0–10.5)
nRBC: 0 % (ref 0.0–0.2)

## 2024-02-25 LAB — STREP PNEUMONIAE URINARY ANTIGEN: Strep Pneumo Urinary Antigen: NEGATIVE

## 2024-02-25 LAB — PROCALCITONIN: Procalcitonin: 0.16 ng/mL

## 2024-02-25 LAB — APTT: aPTT: 53 s — ABNORMAL HIGH (ref 24–36)

## 2024-02-25 LAB — I-STAT CG4 LACTIC ACID, ED: Lactic Acid, Venous: 1.4 mmol/L (ref 0.5–1.9)

## 2024-02-25 LAB — RESP PANEL BY RT-PCR (RSV, FLU A&B, COVID)  RVPGX2
Influenza A by PCR: NEGATIVE
Influenza B by PCR: NEGATIVE
Resp Syncytial Virus by PCR: NEGATIVE
SARS Coronavirus 2 by RT PCR: NEGATIVE

## 2024-02-25 LAB — PROTIME-INR
INR: 3.3 — ABNORMAL HIGH (ref 0.8–1.2)
Prothrombin Time: 35.2 s — ABNORMAL HIGH (ref 11.4–15.2)

## 2024-02-25 LAB — CARDIOLIPIN ANTIBODIES, IGG, IGM, IGA
Anticardiolipin IgA: <9 U/mL (ref 0–11)
Anticardiolipin IgG: <9 GPL U/mL (ref 0–14)
Anticardiolipin IgM: <9 [MPL'U]/mL (ref 0–12)

## 2024-02-25 LAB — GLUCOSE, CAPILLARY: Glucose-Capillary: 137 mg/dL — ABNORMAL HIGH (ref 70–99)

## 2024-02-25 LAB — MAGNESIUM: Magnesium: 1.6 mg/dL — ABNORMAL LOW (ref 1.7–2.4)

## 2024-02-25 MED ORDER — FUROSEMIDE 10 MG/ML IJ SOLN
40.0000 mg | Freq: Two times a day (BID) | INTRAMUSCULAR | Status: DC
  Administered 2024-02-25 – 2024-02-26 (×2): 40 mg via INTRAVENOUS
  Filled 2024-02-25 (×2): qty 4

## 2024-02-25 MED ORDER — SODIUM CHLORIDE 0.9 % IV SOLN
2.0000 g | Freq: Three times a day (TID) | INTRAVENOUS | Status: DC
  Administered 2024-02-25: 2 g via INTRAVENOUS
  Filled 2024-02-25: qty 12.5

## 2024-02-25 MED ORDER — CARVEDILOL 6.25 MG PO TABS
6.2500 mg | ORAL_TABLET | Freq: Two times a day (BID) | ORAL | Status: DC
  Administered 2024-02-26 – 2024-03-06 (×17): 6.25 mg via ORAL
  Filled 2024-02-25 (×18): qty 1

## 2024-02-25 MED ORDER — SERTRALINE HCL 100 MG PO TABS
100.0000 mg | ORAL_TABLET | Freq: Every day | ORAL | Status: DC
  Administered 2024-02-25 – 2024-03-06 (×10): 100 mg via ORAL
  Filled 2024-02-25 (×10): qty 1

## 2024-02-25 MED ORDER — APIXABAN 5 MG PO TABS
5.0000 mg | ORAL_TABLET | Freq: Two times a day (BID) | ORAL | Status: DC
  Administered 2024-02-26 – 2024-02-27 (×3): 5 mg via ORAL
  Filled 2024-02-25 (×4): qty 1

## 2024-02-25 MED ORDER — VANCOMYCIN HCL 1250 MG/250ML IV SOLN
1250.0000 mg | Freq: Once | INTRAVENOUS | Status: AC
  Administered 2024-02-25: 1250 mg via INTRAVENOUS
  Filled 2024-02-25: qty 250

## 2024-02-25 MED ORDER — SODIUM CHLORIDE 0.9 % IV BOLUS
250.0000 mL | Freq: Once | INTRAVENOUS | Status: AC
  Administered 2024-02-25: 250 mL via INTRAVENOUS

## 2024-02-25 MED ORDER — VANCOMYCIN HCL 750 MG/150ML IV SOLN
750.0000 mg | INTRAVENOUS | Status: DC
  Administered 2024-02-26: 750 mg via INTRAVENOUS
  Filled 2024-02-25 (×2): qty 150

## 2024-02-25 MED ORDER — MONTELUKAST SODIUM 10 MG PO TABS
10.0000 mg | ORAL_TABLET | Freq: Every day | ORAL | Status: DC
  Administered 2024-02-25 – 2024-03-06 (×10): 10 mg via ORAL
  Filled 2024-02-25 (×10): qty 1

## 2024-02-25 MED ORDER — ACETAMINOPHEN 650 MG RE SUPP: 650.0000 mg | Freq: Four times a day (QID) | RECTAL | Status: DC | PRN

## 2024-02-25 MED ORDER — SODIUM CHLORIDE 0.9% FLUSH
3.0000 mL | Freq: Two times a day (BID) | INTRAVENOUS | Status: DC
  Administered 2024-02-25 – 2024-03-06 (×20): 3 mL via INTRAVENOUS

## 2024-02-25 MED ORDER — ROSUVASTATIN CALCIUM 20 MG PO TABS
40.0000 mg | ORAL_TABLET | Freq: Every day | ORAL | Status: DC
  Administered 2024-02-25 – 2024-03-06 (×9): 40 mg via ORAL
  Filled 2024-02-25 (×10): qty 2

## 2024-02-25 MED ORDER — GUAIFENESIN ER 600 MG PO TB12
600.0000 mg | ORAL_TABLET | Freq: Two times a day (BID) | ORAL | Status: DC
  Administered 2024-02-25 – 2024-03-02 (×9): 600 mg via ORAL
  Filled 2024-02-25 (×10): qty 1

## 2024-02-25 MED ORDER — ALBUTEROL SULFATE (2.5 MG/3ML) 0.083% IN NEBU
3.0000 mL | INHALATION_SOLUTION | Freq: Four times a day (QID) | RESPIRATORY_TRACT | Status: DC | PRN
  Administered 2024-02-28 – 2024-03-07 (×5): 3 mL via RESPIRATORY_TRACT
  Filled 2024-02-25 (×5): qty 3

## 2024-02-25 MED ORDER — SODIUM CHLORIDE 0.9 % IV SOLN
2.0000 g | Freq: Two times a day (BID) | INTRAVENOUS | Status: AC
  Administered 2024-02-26 – 2024-03-02 (×12): 2 g via INTRAVENOUS
  Filled 2024-02-25 (×12): qty 12.5

## 2024-02-25 MED ORDER — LEVOTHYROXINE SODIUM 75 MCG PO TABS
75.0000 ug | ORAL_TABLET | Freq: Every day | ORAL | Status: DC
  Administered 2024-02-26 – 2024-03-07 (×10): 75 ug via ORAL
  Filled 2024-02-25 (×10): qty 1

## 2024-02-25 MED ORDER — METHYLPREDNISOLONE SODIUM SUCC 40 MG IJ SOLR
40.0000 mg | Freq: Two times a day (BID) | INTRAMUSCULAR | Status: DC
  Administered 2024-02-25 – 2024-02-26 (×3): 40 mg via INTRAVENOUS
  Filled 2024-02-25 (×3): qty 1

## 2024-02-25 MED ORDER — INSULIN ASPART 100 UNIT/ML IJ SOLN
0.0000 [IU] | Freq: Three times a day (TID) | INTRAMUSCULAR | Status: DC
  Administered 2024-02-26: 2 [IU] via SUBCUTANEOUS
  Administered 2024-02-26: 3 [IU] via SUBCUTANEOUS
  Administered 2024-02-26: 2 [IU] via SUBCUTANEOUS
  Administered 2024-02-27: 3 [IU] via SUBCUTANEOUS
  Administered 2024-02-27: 7 [IU] via SUBCUTANEOUS
  Administered 2024-02-27 – 2024-02-28 (×2): 2 [IU] via SUBCUTANEOUS

## 2024-02-25 MED ORDER — SODIUM CHLORIDE 0.9% FLUSH: 3.0000 mL | INTRAVENOUS | Status: DC | PRN

## 2024-02-25 MED ORDER — APIXABAN 5 MG PO TABS
10.0000 mg | ORAL_TABLET | Freq: Two times a day (BID) | ORAL | Status: AC
  Administered 2024-02-25 – 2024-02-26 (×2): 10 mg via ORAL
  Filled 2024-02-25 (×2): qty 2

## 2024-02-25 MED ORDER — NINTEDANIB ESYLATE 100 MG PO CAPS: 100.0000 mg | ORAL_CAPSULE | Freq: Two times a day (BID) | ORAL | Status: DC

## 2024-02-25 MED ORDER — ACETAMINOPHEN 325 MG PO TABS
650.0000 mg | ORAL_TABLET | Freq: Four times a day (QID) | ORAL | Status: DC | PRN
  Administered 2024-03-05 – 2024-03-07 (×2): 650 mg via ORAL
  Filled 2024-02-25 (×2): qty 2

## 2024-02-25 MED ORDER — SODIUM CHLORIDE 0.9 % IV SOLN: 250.0000 mL | INTRAVENOUS | Status: AC | PRN

## 2024-02-25 MED ORDER — IOHEXOL 350 MG/ML SOLN
60.0000 mL | Freq: Once | INTRAVENOUS | Status: AC | PRN
  Administered 2024-02-25: 60 mL via INTRAVENOUS

## 2024-02-25 MED ORDER — PANTOPRAZOLE SODIUM 40 MG IV SOLR
40.0000 mg | Freq: Two times a day (BID) | INTRAVENOUS | Status: DC
  Administered 2024-02-25 – 2024-03-03 (×14): 40 mg via INTRAVENOUS
  Filled 2024-02-25 (×14): qty 10

## 2024-02-25 NOTE — Telephone Encounter (Signed)
 FYI Only or Action Required?: FYI only for provider.  Patient is followed in Pulmonology for pulmonary fibrosis, last seen on 08/19/2023 by Mannam, Praveen, MD.  Called Nurse Triage reporting Hospitalization Follow-up, Shortness of Breath, Fever, Dizziness, Weakness, and low oxygen levels.  Symptoms began several days ago.  Interventions attempted: Home oxygen use, Increased fluids/rest, and Other: hospital admission 7/17-7/21, anitbx in hospital only, used rescue inhaler per nurse advice.  Symptoms are: rapidly worsening.  Triage Disposition: Call EMS 911 Now  Patient/caregiver understands and will follow disposition?: Yes     Copied from CRM #[id number]. Topic: Clinical - Red Word Triage >> Feb 25, 2024 11:31 AM Isabell A wrote: Red Word that prompted transfer to Nurse Triage: Patients spouse states the patient was just discharged from the hospital on Monday, she is having trouble breathing with her oxygen, patient currently has a fever    Reason for Disposition  SEVERE difficulty breathing (e.g., struggling for each breath, speaks in single words)  Answer Assessment - Initial Assessment Questions E2C2 Pulmonary Triage - Initial Assessment Questions Chief Complaint (e.g., cough, sob, wheezing, fever, chills, sweat or additional symptoms) *Go to specific symptom protocol after initial questions. No antibx after hospital On blood thinners, has fibrosis of her lungs, and pneumonia treated in  Concentrator is cut wide open little bit over 5, having trouble, oxygen level will go up if still, but if moves or coughs drops drastically into the 70s to 71% Struggling to breathe at this time, even to get one word out at a time Sometimes looks like fish out of water way she's trying to get her breath They dropped off bottles of oxygen, tried a bit, didn't seem to make any difference Fever 101.3 F, her normal temp is 51 F Struggling to even talk Weak and dizzy, not about to pass out, she  can't talk that much No chest pain  Have you used your inhalers/maintenance medication? No If yes, What medications? Albuterol  not used it  6. CARDIAC HISTORY: Do you have any history of heart disease? (e.g., heart attack, angina, bypass surgery, angioplasty)      significant  7. LUNG HISTORY: Do you have any history of lung disease?  (e.g., pulmonary embolus, asthma, emphysema)     Significant   Use it now with albuterol  inhaler They'll be there shortly Just used albuterol  inhaler, shook her head yes that it helped     Advised pt use albuterol  inhaler if accessible, advised call 911, nurse calling 911 for pt per pt husband request. Nurse stayed on line until EMS arrived, husband confirmed that pt used albuterol  inhaler while nurse was talking with 911, shook her head yes that it helped. Pt husband confirmed that EMS arrived and are assisting, nurse disconnected.  Protocols used: Breathing Difficulty-A-AH

## 2024-02-25 NOTE — Progress Notes (Signed)
 Patient is AXOX4 and remains on 12L HFNC. Patient arrived on 6L [place] and ambulated to bathroom. At rest patient was displaying SOB and pursed lip breathing. Patient's oxygen supplementation was increased to support SOB to 12L HFNC. Patient recovered in bed at rest and reported feeling better once oxygen was increased.   Daughter at bedside. Safety measures are in place, call light is within reach, and 4P's addressed.

## 2024-02-25 NOTE — Progress Notes (Signed)
 PHARMACY - ANTICOAGULATION CONSULT NOTE  Pharmacy Consult for apixaban  continuation  Indication: pulmonary embolus  Allergies  Allergen Reactions   Lisinopril Swelling and Other (See Comments)    Swelling/systemic reaction.   Penicillins Hives   Sulfa Antibiotics Hives and Itching    Red/itching   Clindamycin/Lincomycin Hives    Patient Measurements: Height: 5' 6 (167.6 cm) Weight: 68.9 kg (152 lb) IBW/kg (Calculated) : 59.3 HEPARIN DW (KG): 68.9  Vital Signs: Temp: 98.8 F (37.1 C) (07/23 1747) Temp Source: Oral (07/23 1747) BP: 119/56 (07/23 1830) Pulse Rate: 66 (07/23 1830)  Labs: Recent Labs    02/23/24 0531 02/25/24 1343 02/25/24 1405  HGB 9.3* 10.3* 11.2*  HCT 29.1* 32.9* 33.0*  PLT 290 484*  --   APTT  --  53*  --   LABPROT  --  35.2*  --   INR  --  3.3*  --   CREATININE 1.27* 1.33*  --     Estimated Creatinine Clearance: 34.2 mL/min (A) (by C-G formula based on SCr of 1.33 mg/dL (H)).   Medical History: Past Medical History:  Diagnosis Date   Asthma    Diabetes mellitus without complication (HCC)    Hypertension     Medications:  Apixaban  10mg  BID for PE  Assessment: 47 YOF w/ pmhx asthma, pulmonary fibrosis, HTN, DM, CAD w/ newly diagnosed pneumonia being treated for PE for which she was seen here on 02/19/2024. CT on 02/19/24 showed segmental PE in right upper lobe without RHS. New CT on current admission no longer visualizes the PE. Pt was started on eliquis  10mg  on night of 7/17 and has been on this medication since. This would leave her with two more 10mg  doses before going to 5mg  dose for at least 3 months pending anticoagulation plan. Per pt, she has missed no doses and her last dose was around 1000 this AM. Most recent HGB is 11.2, PLT 484  Goal of Therapy:   Monitor platelets by anticoagulation protocol: Yes   Plan:  - administer apixaban  10mg  PO x 2 doses, 02/25/24 @ 2200 and 02/26/24 @ 1000 - initiate apixaban  5mg  PO starting 02/26/24 @  2200 - monitor CBC daily while inpatient, as well as any other s/sx of bleeding    Belvie Macintosh, PharmD Candidate 02/25/2024,7:20 PM

## 2024-02-25 NOTE — Progress Notes (Signed)
 Patient and daughter at bedside state that patient has not been taking Ofev  and were advised to stop medication by her pulmonologist Dr. Theophilus. Pharmacy was called and made aware.

## 2024-02-25 NOTE — Progress Notes (Signed)
 Pharmacy Antibiotic Note  Kathryn Scott is a 78 y.o. female admitted on 02/25/2024 with pneumonia.  Pharmacy has been consulted for vancomycin  and cefepime  dosing.  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    Allergies  Allergen Reactions   Lisinopril Swelling and Other (See Comments)    Swelling/systemic reaction.   Penicillins Hives   Sulfa Antibiotics Hives and Itching    Red/itching   Clindamycin/Lincomycin Hives    Antimicrobials this admission: Vancomycin , cefepime  7/23 >>  Dose adjustments this admission: N/A  Microbiology results: 7/23 BCx: in process   Thank you for allowing pharmacy to be a part of this patient's care.  Belvie Macintosh, PharmD Candidate 02/25/2024 7:49 PM

## 2024-02-25 NOTE — Sepsis Progress Note (Signed)
 Code Sepsis protocol being monitored by eLink.

## 2024-02-25 NOTE — ED Triage Notes (Signed)
 BIB [facility] from home, patient was discharged Monday from the hospital. Patient has been having increased SHOB that started around 0300. Patient states she is feeling weak and fatigue and dizziness, denies, n/v.  Patient wears 3L [place] at home, her saturations was at 85%. Patients saturations went up to 92% on 6L [place].   HR 64 Cbg 187

## 2024-02-25 NOTE — ED Provider Notes (Signed)
 [place] EMERGENCY DEPARTMENT AT [facility] Provider Note   CSN: [id number] Arrival date & time: 02/25/24  1306     Patient presents with: Shortness of Breath   Kathryn Scott is a 78 y.o. female.    Shortness of Breath   Presents with fatigue.  Worsening cough, fevers.  Last fever was today at home about 101 F.  Patient states that she feels like she is having some worsening shortness of breath.  No chest pain.  She does endorse some episodes of hemoptysis.  Been compliant with her medications including Eliquis .  Patient called on 911 today because of increasing shortness of breath.  Was found to be hypoxic.  Placed on 6 L nasal cannula by EMS and brought to the ED for further evaluation.   Previous medical history reviewed : Patient was discharged on February 23, 2024.  Diagnosed with acute PE.  Also diagnosed with pneumonia as well as underlying interstitial lung disease.  No evidence of right heart strain at that point time.  EF of 65 to 70%.  Negative for DVT bilateral lower extremity ultrasound.  Was discharged on Eliquis .  Discharged with oxygen.  Requires 3 L O2 at rest and 4 L O2 with activity to maintain sats above 88%.  Also diagnosed with community-acquired pneumonia.  Discharged on cefdinir  and azithromycin .      Prior to Admission medications   Medication Sig Start Date End Date Taking? Authorizing Provider  albuterol  (VENTOLIN  HFA) 108 (90 Base) MCG/ACT inhaler Inhale 2 puffs into the lungs every 6 (six) hours as needed for wheezing or shortness of breath. 05/29/20   Mannam, Praveen, MD  amLODipine  (NORVASC ) 10 MG tablet Take 10 mg by mouth daily. 05/11/18   [provider]  APIXABAN  (ELIQUIS ) VTE STARTER PACK (10MG  AND 5MG ) Take as directed on package: start with two-5mg  tablets twice daily for 7 days. On day 8, switch to one-5mg  tablet twice daily. 02/21/24   Ezenduka, Nkeiruka J, MD  BREO ELLIPTA  200-25 MCG/ACT AEPB USE 1 INHALATION  DAILY Patient taking differently: Inhale 1 puff into the lungs daily. 01/21/24   Mannam, Praveen, MD  carvedilol  (COREG ) 6.25 MG tablet Take 1 tablet (6.25 mg total) by mouth 2 (two) times daily. 02/23/24 03/24/24  Ezenduka, Nkeiruka J, MD  Cholecalciferol 50 MCG (2000 UT) CAPS Take 2,000 Units by mouth daily at 12 noon.    [provider]  fenofibrate  120 MG TABS Take 1 tablet (120 mg total) by mouth daily. 02/24/23   Patwardhan, Newman PARAS, MD  ferrous sulfate 325 (65 FE) MG tablet Take 325 mg by mouth daily with breakfast.    [provider]  guaiFENesin  (MUCINEX ) 600 MG 12 hr tablet Take 1 tablet (600 mg total) by mouth 2 (two) times daily for 5 days. 02/21/24 02/26/24  Ezenduka, Nkeiruka J, MD  levothyroxine  (SYNTHROID ) 75 MCG tablet Take 75 mcg by mouth daily before breakfast. 05/11/18   [provider]  Magnesium  400 MG CAPS Take 400 mg by mouth daily at 12 noon.    [provider]  metFORMIN (GLUCOPHAGE-XR) 500 MG 24 hr tablet Take 1,500 mg by mouth daily with breakfast.    [provider]  montelukast  (SINGULAIR ) 10 MG tablet TAKE 1 TABLET AT BEDTIME 01/21/24   Mannam, Praveen, MD  Multiple Vitamin (MULTIVITAMIN WITH MINERALS) TABS tablet Take 1 tablet by mouth daily.    [provider]  OFEV  100 MG CAPS TAKE 1 CAPSULE BY MOUTH 2 TIMES A  DAY WITH FOOD. Patient taking differently: Take 100 mg by mouth 2 (two) times daily with a meal. 10/28/23   Mannam, Praveen, MD  omeprazole (PRILOSEC) 40 MG capsule Take 40 mg by mouth in the morning and at bedtime. 02/22/19   [provider]  rosuvastatin  (CRESTOR ) 40 MG tablet Take 1 tablet (40 mg total) by mouth daily. 02/24/23 02/20/24  Patwardhan, Newman PARAS, MD  sertraline  (ZOLOFT ) 100 MG tablet Take 100 mg by mouth at bedtime. 05/11/18   [provider]  VASCEPA  1 g capsule Take 2 capsules (2 g total) by mouth 2 (two) times daily. 09/08/23   Patwardhan, Newman PARAS, MD    Allergies: Lisinopril,  Penicillins, Sulfa antibiotics, and Clindamycin/lincomycin    Review of Systems  Respiratory:  Positive for shortness of breath.     Updated Vital Signs BP (!) 110/54   Pulse 92   Temp 99.3 F (37.4 C) (Oral)   Resp (!) 35   Ht 5' 6 (1.676 m)   Wt 68.9 kg   SpO2 94%   BMI 24.53 kg/m   Physical Exam Vitals and nursing note reviewed.  Constitutional:      General: She is not in acute distress.    Appearance: She is well-developed.  HENT:     Head: Normocephalic and atraumatic.  Eyes:     Conjunctiva/sclera: Conjunctivae normal.  Cardiovascular:     Rate and Rhythm: Normal rate and regular rhythm.     Heart sounds: No murmur heard. Pulmonary:     Effort: Tachypnea and respiratory distress present.     Breath sounds: Normal breath sounds.     Comments: Crackles right side upper  Abdominal:     Palpations: Abdomen is soft.     Tenderness: There is no abdominal tenderness.  Musculoskeletal:        General: No swelling.     Cervical back: Neck supple.  Skin:    General: Skin is warm and dry.     Capillary Refill: Capillary refill takes less than 2 seconds.  Neurological:     Mental Status: She is alert.  Psychiatric:        Mood and Affect: Mood normal.     (all labs ordered are listed, but only abnormal results are displayed) Labs Reviewed  COMPREHENSIVE METABOLIC PANEL WITH GFR - Abnormal; Notable for the following components:      Result Value   CO2 20 (*)    Glucose, Bld 167 (*)    Creatinine, Ser 1.33 (*)    Calcium  8.8 (*)    Albumin  2.4 (*)    GFR, Estimated 42 (*)    All other components within normal limits  CBC WITH DIFFERENTIAL/PLATELET - Abnormal; Notable for the following components:   WBC 15.3 (*)    RBC 3.45 (*)    Hemoglobin 10.3 (*)    HCT 32.9 (*)    Platelets 484 (*)    Neutro Abs 13.0 (*)    Abs Immature Granulocytes 0.09 (*)    All other components within normal limits  PROTIME-INR - Abnormal; Notable for the following components:    Prothrombin Time 35.2 (*)    INR 3.3 (*)    All other components within normal limits  APTT - Abnormal; Notable for the following components:   aPTT 53 (*)    All other components within normal limits  URINALYSIS, W/ REFLEX TO CULTURE (INFECTION SUSPECTED) - Abnormal; Notable for the following components:   Color, Urine AMBER (*)  APPearance HAZY (*)    Ketones, ur 5 (*)    Protein, ur 30 (*)    Bacteria, UA RARE (*)    All other components within normal limits  I-STAT VENOUS BLOOD GAS, ED - Abnormal; Notable for the following components:   pCO2, Ven 32.6 (*)    Acid-base deficit 3.0 (*)    Calcium , Ion 1.11 (*)    HCT 33.0 (*)    Hemoglobin 11.2 (*)    All other components within normal limits  RESP PANEL BY RT-PCR (RSV, FLU A&B, COVID)  RVPGX2  CULTURE, BLOOD (ROUTINE X 2)  CULTURE, BLOOD (ROUTINE X 2)  I-STAT CG4 LACTIC ACID, ED  I-STAT CG4 LACTIC ACID, ED    EKG: EKG Interpretation Date/Time:  Wednesday February 25 2024 14:24:01 EDT Ventricular Rate:  95 PR Interval:  142 QRS Duration:  85 QT Interval:  415 QTC Calculation: 401 R Axis:   14  Text Interpretation: Sinus rhythm Ventricular bigeminy Confirmed by Simon Rea [phone]) on 02/25/2024 3:33:33 PM  Radiology: CT Angio Chest PE W and/or Wo Contrast Result Date: 02/25/2024 CLINICAL DATA:  Follow-up PE, increased oxygen demand EXAM: CT ANGIOGRAPHY CHEST WITH CONTRAST TECHNIQUE: Multidetector CT imaging of the chest was performed using the standard protocol during bolus administration of intravenous contrast. Multiplanar CT image reconstructions and MIPs were obtained to evaluate the vascular anatomy. RADIATION DOSE REDUCTION: This exam was performed according to the departmental dose-optimization program which includes automated exposure control, adjustment of the mA and/or kV according to patient size and/or use of iterative reconstruction technique. CONTRAST:  60mL OMNIPAQUE  IOHEXOL  350 MG/ML SOLN COMPARISON:  Chest  x-ray 02/25/2024, chest CT 02/19/2024, CT chest 07/25/2023 FINDINGS: Cardiovascular: Satisfactory opacification of the pulmonary arteries to the segmental level. No evidence of pulmonary embolism. Previously noted small right upper lobe embolus is no longer visualized. Moderate aortic atherosclerosis. No aneurysm or dissection. Borderline to mild cardiomegaly. No pericardial effusion Mediastinum/Nodes: Patent trachea. No thyroid mass. Prominent right paratracheal node measuring up to 14 mm. AP window nodes measuring up to 9 mm. Right hilar nodes measuring up to 17 mm. Esophagus within normal limits. Lungs/Pleura: Underlying chronic lung disease and fibrosis. Increased small left and small moderate right pleural effusions compared to prior. Worsened diffuse bilateral heterogeneous ground-glass airspace disease throughout the lungs. No pneumothorax. Upper Abdomen: No acute finding Musculoskeletal: No acute osseous abnormality. Review of the MIP images confirms the above findings. IMPRESSION: 1. Negative for acute pulmonary embolus. Previously noted small right upper lobe embolus is no longer visualized. 2. Underlying chronic lung disease and fibrosis. Worsened diffuse bilateral heterogeneous ground-glass airspace disease throughout the lungs, suspect for worsened edema or infection. Increased small left and small to moderate right pleural effusions. 3. Mild mediastinal and right hilar adenopathy, likely reactive, slightly increased. 4. Aortic atherosclerosis. Aortic Atherosclerosis (ICD10-I70.0). Electronically Signed   By: Luke Bun M.D.   On: 02/25/2024 16:03   DG Chest Port 1 View Result Date: 02/25/2024 CLINICAL DATA:  Questionable sepsis-evaluate for abnormality. EXAM: PORTABLE CHEST 1 VIEW COMPARISON:  Radiographs 02/19/2024 and 08/23/2021. CT 02/19/2024 and 07/25/2023. FINDINGS: 1418 hours. The heart size and mediastinal contours are stable with aortic atherosclerosis. Background diffuse interstitial  thickening characterized as probable UIP on previous high-resolution chest CT. Compared with the recent prior studies, there is increased diffuse interstitial thickening, suspicious for edema or atypical infection. The focal airspace disease inferiorly in the right upper lobe has partially cleared. No evidence of pneumothorax or significant pleural effusion. No acute osseous  findings. Telemetry leads overlie the chest. IMPRESSION: 1. Increased diffuse interstitial thickening suspicious for edema or atypical infection superimposed on chronic interstitial lung disease. Continued follow-up recommended. 2. Partial clearing of focal airspace disease inferiorly in the right upper lobe. Electronically Signed   By: Elsie Perone M.D.   On: 02/25/2024 14:33     Procedures   Medications Ordered in the ED  vancomycin  (VANCOREADY) IVPB 1250 mg/250 mL ( Intravenous Restarted 02/25/24 1519)  ceFEPIme  (MAXIPIME ) 2 g in sodium chloride  0.9 % 100 mL IVPB (has no administration in time range)  sodium chloride  0.9 % bolus 250 mL (0 mLs Intravenous Stopped 02/25/24 1543)  iohexol  (OMNIPAQUE ) 350 MG/ML injection 60 mL (60 mLs Intravenous Contrast Given 02/25/24 1551)                                    Medical Decision Making Amount and/or Complexity of Data Reviewed Labs: ordered. Radiology: ordered.  Risk Prescription drug management.   Previous medical history reviewed : Patient was discharged on February 23, 2024.  Diagnosed with acute PE.  Also diagnosed with pneumonia as well as underlying interstitial lung disease.  No evidence of right heart strain at that point time.  EF of 65 to 70%.  Negative for DVT bilateral lower extremity ultrasound.  Was discharged on Eliquis .  Discharged with oxygen.  Requires 3 L O2 at rest and 4 L O2 with activity to maintain sats above 88%.  Also diagnosed with community-acquired pneumonia.  Discharged on cefdinir  and azithromycin .\    Presents with fatigue.  Worsening cough,  fevers.  Last fever was today at home about 101 F.  Patient states that she feels like she is having some worsening shortness of breath.  No chest pain.  She does endorse some episodes of hemoptysis.  Been compliant with her medications including Eliquis .  Patient called on 911 today because of increasing shortness of breath.  Was found to be hypoxic.  Placed on 6 L nasal cannula by EMS and brought to the ED for further evaluation.   Upon exam, patient was on nonrebreather whenever I walked in the room.  Blood pressure was appropriate with a systolic of 119.  Afebrile at that time.  O2 saturation 100% on nonrebreather.  Some slight tachypnea.  Crackles noted in the right lung.   Given reported fevers at home as well as known pneumonia seen on CT, will order infectious labs including blood culture.  I biggest concern is whether or not this could be treatment failure in terms of pneumonia outpatient versus worsening PE burden.  Will also obtain CTA of the chest.   Will hold off at this moment of time upon initial evaluation only count of aggressive fluid resuscitation just in case this is a submassive PE with right heart strain.  Maps are appropriate this point time so I think it is okay to hold off and reassess clinically if hemodynamics change.  End of given 250 cc bolus but otherwise held off on aggressive fluid resuscitation.   I did obtain a CT of the chest.  Will rule out worsening PE burden.  CT of the chest showed probably infectious etiology in terms of the groundglass opacities.  Does not make sense in the setting of a leukocytosis as well as her being febrile at home.  Patient had already been started on vancomycin  and cefepime .  No PE.   Patient on mid  flow.  Sats in the high 90s.  Looks comfortable.  Spoke to the hospitalist about admission for the patient.  They will come down to see the patient will most likely admit.   CRITICAL CARE Performed by: Lavonia LOISE Pat   Total critical  care time: 45 minutes  Critical care time was exclusive of separately billable procedures and treating other patients.  Critical care was necessary to treat or prevent imminent or life-threatening deterioration.  Critical care was time spent personally by me on the following activities: development of treatment plan with patient and/or surrogate as well as nursing, discussions with consultants, evaluation of patient's response to treatment, examination of patient, obtaining history from patient or surrogate, ordering and performing treatments and interventions, ordering and review of laboratory studies, ordering and review of radiographic studies, pulse oximetry and re-evaluation of patient's condition.      Final diagnoses:  Pneumonia due to infectious organism, unspecified laterality, unspecified part of lung  Sepsis, due to unspecified organism, unspecified whether acute organ dysfunction present [facility])    ED Discharge Orders     None          Pat Lavonia LOISE, MD 02/25/24 [phone]

## 2024-02-25 NOTE — Progress Notes (Signed)
   02/25/24 2320  BiPAP/CPAP/SIPAP  $ Non-Invasive Home Ventilator  Initial  $ Face Mask Medium Yes  BiPAP/CPAP/SIPAP Pt Type Adult  BiPAP/CPAP/SIPAP DREAMSTATIOND  Mask Type Full face mask  Mask Size Medium  IPAP 12 cmH20  EPAP 6 cmH2O  Flow Rate 8 lpm  Patient Home Machine No  Patient Home Mask No  Patient Home Tubing No  Auto Titrate No  Press High Alarm 12 cmH2O  Press Low Alarm 6 cmH2O  CPAP/SIPAP surface wiped down Yes  Device Plugged into RED Power Outlet Yes  BiPAP/CPAP /SiPAP Vitals  Resp 20  SpO2 96 %

## 2024-02-25 NOTE — ED Notes (Signed)
 Went to CT

## 2024-02-25 NOTE — Telephone Encounter (Signed)
 Acknowledged. Pt has ov 7/28

## 2024-02-25 NOTE — Consult Note (Signed)
 NAME:  Kathryn Scott, MRN:  [id number], DOB:  [birth year], LOS: 0 ADMISSION DATE:  02/25/2024, CONSULTATION DATE: 02/25/2024 REFERRING MD: CHARLENA Blanch, MD, CHIEF COMPLAINT: Acute hypoxic respiratory failure  History of Present Illness:   The patient, with idiopathic pulmonary fibrosis, presents with worsening respiratory symptoms following a recent hospitalization for pneumonia and pulmonary embolism. She is accompanied by her daughter, Rojelio.  Dyspnea and hypoxemia - Worsening shortness of breath following recent hospitalization for pneumonia and pulmonary embolism - Oxygen saturation drops to 70-71% during activities such as talking or going to the restroom, even when oxygen concentrator increased to five liters - Currently requiring 18 liters of oxygen - Discharged from hospital on Monday with instructions for oxygen therapy at three liters at rest and four liters during activity  Cough and hemoptysis - Cough with bloody mucus developed this morning  Fever - Fever developed this morning to 101  Recent hospitalization and cardiac findings - Admitted last Wednesday morning and discharged on Monday - Diagnosed with pneumonia, small pulmonary embolism, and suspected atrial fibrillation during hospitalization - Echocardiogram performed during last hospital visit showed moderate pulmonary HTN - Previous cardiac ultrasound in January of 2025 did not show any evidence of pulmonary hypertension  Idiopathic pulmonary fibrosis management - Receiving treatment with Ofev , taken consistently at home  Anticoagulation therapy - On Eliquis  for small pulmonary embolism, started at last admission  Peripheral edema - No swelling in feet  Pertinent  Medical History    has a past medical history of Asthma, Diabetes mellitus without complication (HCC), and Hypertension.   Significant Hospital Events: Including procedures, antibiotic start and stop dates in addition to other pertinent  events     Interim History / Subjective:    Objective    Blood pressure (!) 113/45, pulse (!) 46, temperature 98.8 F (37.1 C), temperature source Oral, resp. rate (!) 21, height 5' 6 (1.676 m), weight 68.9 kg, SpO2 96%.       No intake or output data in the 24 hours ending 02/25/24 1828 Filed Weights   02/25/24 1330  Weight: 68.9 kg    Examination: Gen:      No acute distress HEENT:  EOMI, sclera anicteric Neck:     No masses; no thyromegaly Lungs:    Bilateral crackles CV:         Regular rate and rhythm; no murmurs Abd:      + bowel sounds; soft, non-tender; no palpable masses, no distension Ext:    No edema; adequate peripheral perfusion Neuro: alert and oriented x 3 Psych: normal mood and affect    Labs/imaging reviewed Significant for BUN/creatinine 14/1.33 WBC 15.3, hemoglobin 10.3, platelets 484 Negative for COVID, flu, RSV  CTA 02/19/2024-segmental pulmonary embolism in the right upper lobe, new airspace consolidation, groundglass opacities in the right upper lobe, small right pleural effusion  CTA 02/24/2021-negative for pulmonary embolism.  Worsening diffuse bilateral heterogeneous groundglass opacities, mild adenopathy.  Echocardiogram 02/20/2024-LVEF 65-70%.  RV systolic function is normal, moderate elevation in PA systolic pressure  Resolved problem list   Assessment and Plan  Pneumonia Recurrent pneumonia with recent fever and productive cough with hemoptysis. CT scan suggests possible infection or pulmonary edema. Differential includes worsening pneumonia, new infection, or fluid accumulation. - Administer broad-spectrum antibiotics with vancomycin , cefepime  - Check respiratory virus panel, and urine Legionella, pneumococcal - Start Solu-Medrol  40 mg every 12 - Administer Lasix  fluid management  Interstitial pulmonary fibrosis Interstitial pulmonary fibrosis managed with Ofev . Current symptoms likely due to  infection rather than IPF flare-up as she is  presenting with fevers and productive sputum.  Pulmonary embolism Previously diagnosed small pulmonary embolism with resolution on recent CT scan. On Eliquis  for anticoagulation.  Pulmonary hypertension Recent echocardiogram showed elevated PA pressure, possibly due to hypoxemia or previous pulmonary embolism.  Of note her echocardiogram in January did not show any evidence of pulmonary hypertension - Re-evaluate with echocardiogram when she is stabilized.  Continue right heart catheterization if pulmonary hypertension persists  OSA Continue CPAP.  She has been compliant with therapy as outpatient  Best Practice (right click and Reselect all SmartList Selections daily)   Per primary team  Critical care time:    Katrianna Friesenhahn MD [facility] See Amion for pager  If no response to pager , please call [phone] until 7pm After 7:00 pm call Elink  [phone] 02/25/2024, 6:38 PM

## 2024-02-25 NOTE — ED Notes (Signed)
 Called CCMD.

## 2024-02-25 NOTE — Progress Notes (Signed)
 ED Pharmacy Antibiotic Sign Off An antibiotic consult was received from an ED provider for vancomycin  per pharmacy dosing for pneumonia. A chart review was completed to assess appropriateness.    cefepime  2g Q8h was placed by the ED provider.    The following one time order(s) were placed per pharmacy consult:  vancomycin  1250 mg x 1 dose    Further antibiotic and/or antibiotic pharmacy consults should be ordered by the admitting provider if indicated.    Thank you for allowing pharmacy to be a part of this patient's care.   Belvie Macintosh, PharmD Candidate 2:30 PM 02/25/2024

## 2024-02-25 NOTE — H&P (Addendum)
 History and Physical    Patient: Kathryn Scott FMW:[id number] DOB: 07-23-1946 DOA: 02/25/2024 DOS: the patient was seen and examined on 02/25/2024 . PCP: Carvin [name], MD  Patient coming from: Home Chief complaint: Chief Complaint  Patient presents with   Shortness of Breath  HPI:  Kathryn Scott is a 78 y.o. female with past medical history  of pulmonary embolism, idiopathic pulmonary fibrosis, community-acquired pneumonia, acute kidney injury, essential hypertension, diabetes mellitus type 2, acquired hypothyroidism, Presenting today in respiratory distress and shortness of breath.  Patient was recently admitted on the 17th and discharged on the 21st found to have a pulmonary embolism.   ED Course:  Vital signs in the ED were notable for the following:  Vitals:   02/25/24 1747 02/25/24 1800 02/25/24 1815 02/25/24 1830  BP:  122/69 114/61 (!) 119/56  Pulse:  71 66 66  Temp: 98.8 F (37.1 C)     Resp:  (!) 30 (!) 32 (!) 21  Height:      Weight:      SpO2:  (!) 88% 95% 94%  TempSrc: Oral     BMI (Calculated):      >>ED evaluation thus far shows: VBG showing pco2 of 32.6 CMP shows  aki of 1.33 and gfr of 44  CBC shows leucocytosis of 15.3 and anemia of 10.3 plt of 484. U/A neg for wbc nitrite leucocytes.  Chest xray shows increased diffuse interstitial infiltrates. EKG show sr  79 with ventricular bigeminy.   >>While in the ED patient received the following: Medications  vancomycin  (VANCOREADY) IVPB 1250 mg/250 mL ( Intravenous Restarted 02/25/24 1519)  ceFEPIme  (MAXIPIME ) 2 g in sodium chloride  0.9 % 100 mL IVPB (has no administration in time range)  sodium chloride  0.9 % bolus 250 mL (0 mLs Intravenous Stopped 02/25/24 1543)  iohexol  (OMNIPAQUE ) 350 MG/ML injection 60 mL (60 mLs Intravenous Contrast Given 02/25/24 1551)   Review of Systems  Constitutional:  Positive for malaise/fatigue.  Respiratory:  Positive for cough and shortness of  breath.    Past Medical History:  Diagnosis Date   Asthma    Diabetes mellitus without complication (HCC)    Hypertension    Past Surgical History:  Procedure Laterality Date   CARPAL TUNNEL RELEASE Bilateral    DE QUERVAIN'S RELEASE     THYROIDECTOMY, PARTIAL     TRIGGER FINGER RELEASE     bilateral thumbs, left index finger   VAGINAL HYSTERECTOMY      reports that she quit smoking about 38 years ago. Her smoking use included cigarettes. She started smoking about 58 years ago. She has a 60 pack-year smoking history. She has never used smokeless tobacco. She reports current alcohol use. She reports that she does not use drugs. Allergies  Allergen Reactions   Lisinopril Swelling and Other (See Comments)    Swelling/systemic reaction.   Penicillins Hives   Sulfa Antibiotics Hives and Itching    Red/itching   Clindamycin/Lincomycin Hives   Family History  Problem Relation Age of Onset   Stroke Mother    Stroke Father    Heart disease Father    Lung cancer Father    Heart attack Father    Heart attack Brother    Heart disease Brother    Diabetes Son    Stroke Son    Diabetes Son    Diabetes Daughter    Psoriasis Daughter    Allergies Daughter    Prior to Admission medications   Medication Sig  Start Date End Date Taking? Authorizing Provider  albuterol  (VENTOLIN  HFA) 108 (90 Base) MCG/ACT inhaler Inhale 2 puffs into the lungs every 6 (six) hours as needed for wheezing or shortness of breath. 05/29/20   Mannam, Praveen, MD  amLODipine  (NORVASC ) 10 MG tablet Take 10 mg by mouth daily. 05/11/18   [provider]  APIXABAN  (ELIQUIS ) VTE STARTER PACK (10MG  AND 5MG ) Take as directed on package: start with two-5mg  tablets twice daily for 7 days. On day 8, switch to one-5mg  tablet twice daily. 02/21/24   Ezenduka, Nkeiruka J, MD  BREO ELLIPTA  200-25 MCG/ACT AEPB USE 1 INHALATION DAILY Patient taking differently: Inhale 1 puff into the lungs daily. 01/21/24   Mannam, Praveen,  MD  carvedilol  (COREG ) 6.25 MG tablet Take 1 tablet (6.25 mg total) by mouth 2 (two) times daily. 02/23/24 03/24/24  Ezenduka, Nkeiruka J, MD  Cholecalciferol 50 MCG (2000 UT) CAPS Take 2,000 Units by mouth daily at 12 noon.    [provider]  fenofibrate  120 MG TABS Take 1 tablet (120 mg total) by mouth daily. 02/24/23   Patwardhan, Newman PARAS, MD  ferrous sulfate 325 (65 FE) MG tablet Take 325 mg by mouth daily with breakfast.    [provider]  guaiFENesin  (MUCINEX ) 600 MG 12 hr tablet Take 1 tablet (600 mg total) by mouth 2 (two) times daily for 5 days. 02/21/24 02/26/24  Ezenduka, Nkeiruka J, MD  levothyroxine  (SYNTHROID ) 75 MCG tablet Take 75 mcg by mouth daily before breakfast. 05/11/18   [provider]  Magnesium  400 MG CAPS Take 400 mg by mouth daily at 12 noon.    [provider]  metFORMIN (GLUCOPHAGE-XR) 500 MG 24 hr tablet Take 1,500 mg by mouth daily with breakfast.    [provider]  montelukast  (SINGULAIR ) 10 MG tablet TAKE 1 TABLET AT BEDTIME 01/21/24   Mannam, Praveen, MD  Multiple Vitamin (MULTIVITAMIN WITH MINERALS) TABS tablet Take 1 tablet by mouth daily.    [provider]  OFEV  100 MG CAPS TAKE 1 CAPSULE BY MOUTH 2 TIMES A DAY WITH FOOD. Patient taking differently: Take 100 mg by mouth 2 (two) times daily with a meal. 10/28/23   Mannam, Praveen, MD  omeprazole (PRILOSEC) 40 MG capsule Take 40 mg by mouth in the morning and at bedtime. 02/22/19   [provider]  rosuvastatin  (CRESTOR ) 40 MG tablet Take 1 tablet (40 mg total) by mouth daily. 02/24/23 02/20/24  Patwardhan, Newman PARAS, MD  sertraline  (ZOLOFT ) 100 MG tablet Take 100 mg by mouth at bedtime. 05/11/18   [provider]  VASCEPA  1 g capsule Take 2 capsules (2 g total) by mouth 2 (two) times daily. 09/08/23   Elmira Newman PARAS, MD                                                                                 Vitals:   02/25/24 1747 02/25/24 1800 02/25/24  1815 02/25/24 1830  BP:  122/69 114/61 (!) 119/56  Pulse:  71 66 66  Resp:  (!) 30 (!) 32 (!) 21  Temp: 98.8 F (37.1 C)     TempSrc: Oral     SpO2:  ROLLEN)  88% 95% 94%  Weight:      Height:       Physical Exam Vitals and nursing note reviewed.  Constitutional:      General: She is in acute distress.     Appearance: She is ill-appearing.  HENT:     Head: Normocephalic and atraumatic.     Right Ear: Hearing normal.     Left Ear: Hearing normal.     Nose: No nasal deformity.     Mouth/Throat:     Lips: Pink.  Eyes:     General: Lids are normal.     Extraocular Movements: Extraocular movements intact.  Neck:     Vascular: No JVD.  Cardiovascular:     Rate and Rhythm: Regular rhythm. Tachycardia present.     Pulses: Normal pulses.     Heart sounds: Normal heart sounds.  Pulmonary:     Effort: Pulmonary effort is normal.     Breath sounds: Examination of the right-upper field reveals rales. Examination of the left-upper field reveals rales. Examination of the right-middle field reveals rales. Examination of the left-middle field reveals rales. Examination of the right-lower field reveals rales. Examination of the left-lower field reveals rales. Rales present.  Abdominal:     General: Bowel sounds are normal. There is no distension.     Palpations: Abdomen is soft. There is no mass.     Tenderness: There is no abdominal tenderness.  Musculoskeletal:     Right lower leg: No edema.     Left lower leg: No edema.  Skin:    General: Skin is warm.     Coloration: Skin is pale.  Neurological:     General: No focal deficit present.     Mental Status: She is alert and oriented to person, place, and time.     Cranial Nerves: Cranial nerves 2-12 are intact.  Psychiatric:        Speech: Speech normal.     Labs on Admission: I have personally reviewed following labs and imaging studies CBC: Recent Labs  Lab 02/19/24 1830 02/21/24 0305 02/22/24 0438 02/23/24 0531 02/25/24 1343  02/25/24 1405  WBC 13.4* 9.3 9.2 9.6 15.3*  --   NEUTROABS  --   --   --  6.7 13.0*  --   HGB 10.9* 9.5* 9.4* 9.3* 10.3* 11.2*  HCT 33.7* 29.9* 29.7* 29.1* 32.9* 33.0*  MCV 92.6 93.7 95.2 93.9 95.4  --   PLT 274 243 264 290 484*  --    Basic Metabolic Panel: Recent Labs  Lab 02/20/24 0939 02/21/24 0305 02/22/24 0438 02/23/24 0531 02/25/24 1343 02/25/24 1405  NA 135 137 137 139 137 140  K 4.0 4.3 4.9 4.9 4.8 4.8  CL 102 109 106 107 106  --   CO2 22 21* 22 22 20*  --   GLUCOSE 115* 157* 130* 136* 167*  --   BUN 21 15 21 20 14   --   CREATININE 1.35* 1.24* 1.38* 1.27* 1.33*  --   CALCIUM  8.9 8.4* 8.6* 8.6* 8.8*  --   MG  --   --   --   --  1.6*  --    GFR: Estimated Creatinine Clearance: 34.2 mL/min (A) (by C-G formula based on SCr of 1.33 mg/dL (H)). Liver Function Tests: Recent Labs  Lab 02/19/24 1830 02/25/24 1343  AST 29 29  ALT 19 18  ALKPHOS 49 46  BILITOT 0.4 0.5  PROT 6.6 6.8  ALBUMIN  3.4* 2.4*   No  results for input(s): LIPASE, AMYLASE in the last 168 hours. No results for input(s): AMMONIA in the last 168 hours. Coagulation Profile: Recent Labs  Lab 02/25/24 1343  INR 3.3*   Cardiac Enzymes: No results for input(s): CKTOTAL, CKMB, CKMBINDEX, TROPONINI in the last 168 hours. BNP (last 3 results) No results for input(s): PROBNP in the last 8760 hours. HbA1C: No results for input(s): HGBA1C in the last 72 hours. CBG: No results for input(s): GLUCAP in the last 168 hours. Lipid Profile: No results for input(s): CHOL, HDL, LDLCALC, TRIG, CHOLHDL, LDLDIRECT in the last 72 hours. Thyroid Function Tests: No results for input(s): TSH, T4TOTAL, FREET4, T3FREE, THYROIDAB in the last 72 hours. Anemia Panel: No results for input(s): VITAMINB12, FOLATE, FERRITIN, TIBC, IRON, RETICCTPCT in the last 72 hours. Urine analysis:    Component Value Date/Time   COLORURINE AMBER (A) 02/25/2024 1343   APPEARANCEUR  HAZY (A) 02/25/2024 1343   LABSPEC 1.023 02/25/2024 1343   PHURINE 5.0 02/25/2024 1343   GLUCOSEU NEGATIVE 02/25/2024 1343   HGBUR NEGATIVE 02/25/2024 1343   BILIRUBINUR NEGATIVE 02/25/2024 1343   KETONESUR 5 (A) 02/25/2024 1343   PROTEINUR 30 (A) 02/25/2024 1343   NITRITE NEGATIVE 02/25/2024 1343   LEUKOCYTESUR NEGATIVE 02/25/2024 1343   Radiological Exams on Admission: CT Angio Chest PE W and/or Wo Contrast Result Date: 02/25/2024 CLINICAL DATA:  Follow-up PE, increased oxygen demand EXAM: CT ANGIOGRAPHY CHEST WITH CONTRAST TECHNIQUE: Multidetector CT imaging of the chest was performed using the standard protocol during bolus administration of intravenous contrast. Multiplanar CT image reconstructions and MIPs were obtained to evaluate the vascular anatomy. RADIATION DOSE REDUCTION: This exam was performed according to the departmental dose-optimization program which includes automated exposure control, adjustment of the mA and/or kV according to patient size and/or use of iterative reconstruction technique. CONTRAST:  60mL OMNIPAQUE  IOHEXOL  350 MG/ML SOLN COMPARISON:  Chest x-ray 02/25/2024, chest CT 02/19/2024, CT chest 07/25/2023 FINDINGS: Cardiovascular: Satisfactory opacification of the pulmonary arteries to the segmental level. No evidence of pulmonary embolism. Previously noted small right upper lobe embolus is no longer visualized. Moderate aortic atherosclerosis. No aneurysm or dissection. Borderline to mild cardiomegaly. No pericardial effusion Mediastinum/Nodes: Patent trachea. No thyroid mass. Prominent right paratracheal node measuring up to 14 mm. AP window nodes measuring up to 9 mm. Right hilar nodes measuring up to 17 mm. Esophagus within normal limits. Lungs/Pleura: Underlying chronic lung disease and fibrosis. Increased small left and small moderate right pleural effusions compared to prior. Worsened diffuse bilateral heterogeneous ground-glass airspace disease throughout the lungs.  No pneumothorax. Upper Abdomen: No acute finding Musculoskeletal: No acute osseous abnormality. Review of the MIP images confirms the above findings. IMPRESSION: 1. Negative for acute pulmonary embolus. Previously noted small right upper lobe embolus is no longer visualized. 2. Underlying chronic lung disease and fibrosis. Worsened diffuse bilateral heterogeneous ground-glass airspace disease throughout the lungs, suspect for worsened edema or infection. Increased small left and small to moderate right pleural effusions. 3. Mild mediastinal and right hilar adenopathy, likely reactive, slightly increased. 4. Aortic atherosclerosis. Aortic Atherosclerosis (ICD10-I70.0). Electronically Signed   By: Luke Bun M.D.   On: 02/25/2024 16:03   DG Chest Port 1 View Result Date: 02/25/2024 CLINICAL DATA:  Questionable sepsis-evaluate for abnormality. EXAM: PORTABLE CHEST 1 VIEW COMPARISON:  Radiographs 02/19/2024 and 08/23/2021. CT 02/19/2024 and 07/25/2023. FINDINGS: 1418 hours. The heart size and mediastinal contours are stable with aortic atherosclerosis. Background diffuse interstitial thickening characterized as probable UIP on previous high-resolution chest CT. Compared  with the recent prior studies, there is increased diffuse interstitial thickening, suspicious for edema or atypical infection. The focal airspace disease inferiorly in the right upper lobe has partially cleared. No evidence of pneumothorax or significant pleural effusion. No acute osseous findings. Telemetry leads overlie the chest. IMPRESSION: 1. Increased diffuse interstitial thickening suspicious for edema or atypical infection superimposed on chronic interstitial lung disease. Continued follow-up recommended. 2. Partial clearing of focal airspace disease inferiorly in the right upper lobe. Electronically Signed   By: Elsie Perone M.D.   On: 02/25/2024 14:33   Data Reviewed: Relevant notes from primary care and specialist visits, past  discharge summaries as available in EHR, including Care Everywhere . Prior diagnostic testing as pertinent to current admission diagnoses, Updated medications and problem lists for reconciliation .ED course, including vitals, labs, imaging, treatment and response to treatment,Triage notes, nursing and pharmacy notes and ED provider's notes.Notable results as noted in HPI.Discussed case with EDMD/ ED APP/ or Specialty MD on call and as needed.  Assessment & Plan  >> Respiratory distress/acute on chronic respiratory failure with hypoxia: Per CT imaging concerns of worsening diffuse bilateral heterogeneous groundglass airspace disease throughout the lungs concerning for worsening edema or infection.  Echocardiogram that the patient had in July showed normal ejection fraction, EF of 65 to 70% with moderately elevated pulmonary artery systolic pressure.  Suspect patient has underlying right-sided heart heart failure secondary to elevated pulmonary pressures due to her IPF.  No lower extremity edema or weight gain, patient does have crackles diffusely on auscultation.  Will consider midodrine and currently hold amlodipine  to allow room for diuresis, with as needed Lasix  trials.  Strict I's and O's, pulmonology consult and cardiology consult.Inhaled pulmicort albuterol  q4PRN..    >>PE: Cont Eliquis .   >>UIP: Patient has a history of asthma and UIP interstitial pulmonary fibrosis and is on Ofev  as well as followed by pulmonology in the outpatient setting Continue Singulair  and Breo.  Albuterol  nebs as needed for shortness of breath/wheezing pulm consult.   >>Presumed PNA: Cont iv abx for HCAP coverage and follow procalcitonin.    >>Essential HTN: Vitals:   02/25/24 1415 02/25/24 1430 02/25/24 1445 02/25/24 1500  BP: (!) 109/42 121/63 (!) 122/55 (!) 102/48   02/25/24 1515 02/25/24 1545 02/25/24 1600 02/25/24 1700  BP: (!) 110/54 (!) 124/49 (!) 108/92 (!) 124/49   02/25/24 1730 02/25/24 1800 02/25/24  1815 02/25/24 1830  BP: (!) 113/45 122/69 114/61 (!) 119/56  Amlodipine  held and coreg  continued.    >>Hypothyroidism: Cont levothyroxine  75 mcg.    >> Diabetes mellitus type II:  Glycemic protocol q4 hourly check.    >> AKI : Lab Results  Component Value Date   CREATININE 1.33 (H) 02/25/2024   CREATININE 1.27 (H) 02/23/2024   CREATININE 1.38 (H) 02/22/2024  Hold metformin.  Avoid contrast.    DVT prophylaxis:  Eliquis .  Consults:  Pulmonary.  Cardiology.   Advance Care Planning:    Code Status: Full Code   Family Communication:  Daughter and husband.  Disposition Plan:  Home.  Severity of Illness: The appropriate patient status for this patient is INPATIENT. Inpatient status is judged to be reasonable and necessary in order to provide the required intensity of service to ensure the patient's safety. The patient's presenting symptoms, physical exam findings, and initial radiographic and laboratory data in the context of their chronic comorbidities is felt to place them at high risk for further clinical deterioration. Furthermore, it is not anticipated that the patient  will be medically stable for discharge from the hospital within 2 midnights of admission.   * I certify that at the point of admission it is my clinical judgment that the patient will require inpatient hospital care spanning beyond 2 midnights from the point of admission due to high intensity of service, high risk for further deterioration and high frequency of surveillance required.*  Unresulted Labs (From admission, onward)     Start     Ordered   02/26/24 0500  Comprehensive metabolic panel  Tomorrow morning,   R        02/25/24 1738   02/26/24 0500  CBC  Tomorrow morning,   R        02/25/24 1738   02/25/24 1855  Hemoglobin A1c  Once,   R       Comments: To assess prior glycemic control    02/25/24 1854   02/25/24 1841  Legionella Pneumophila Serogp 1 Ur Ag  Once,   R        02/25/24 1840    02/25/24 1840  Respiratory (~20 pathogens) panel by PCR  (Respiratory panel by PCR (~20 pathogens, ~24 hr TAT)  w precautions)  Once,   R        02/25/24 1839   02/25/24 1736  Expectorated Sputum Assessment w Gram Stain, Rflx to Resp Cult  (COPD / Pneumonia / Cellulitis / Lower Extremity Wound (Diabetic Foot Infection))  Once,   R        02/25/24 1738   02/25/24 1736  Strep pneumoniae urinary antigen  (COPD / Pneumonia / Cellulitis / Lower Extremity Wound (Diabetic Foot Infection))  Once,   R        02/25/24 1738   02/25/24 1628  Procalcitonin  Add-on,   AD       References:    Procalcitonin Lower Respiratory Tract Infection AND Sepsis Procalcitonin Algorithm   02/25/24 1627   02/25/24 1343  Blood Culture (routine x 2)  (Septic presentation on arrival (screening labs, nursing and treatment orders for obvious sepsis))  BLOOD CULTURE X 2,   STAT      02/25/24 1342            Meds ordered this encounter  Medications   DISCONTD: ceFEPIme  (MAXIPIME ) 2 g in sodium chloride  0.9 % 100 mL IVPB    Antibiotic Indication::   HCAP   sodium chloride  0.9 % bolus 250 mL   vancomycin  (VANCOREADY) IVPB 1250 mg/250 mL    Indication::   Pneumonia   iohexol  (OMNIPAQUE ) 350 MG/ML injection 60 mL   ceFEPIme  (MAXIPIME ) 2 g in sodium chloride  0.9 % 100 mL IVPB    Antibiotic Indication::   HCAP   albuterol  (PROVENTIL ) (2.5 MG/3ML) 0.083% nebulizer solution 3 mL   carvedilol  (COREG ) tablet 6.25 mg   guaiFENesin  (MUCINEX ) 12 hr tablet 600 mg   levothyroxine  (SYNTHROID ) tablet 75 mcg   montelukast  (SINGULAIR ) tablet 10 mg   Nintedanib  (Ofev ) CAPS 100 mg   rosuvastatin  (CRESTOR ) tablet 40 mg   sertraline  (ZOLOFT ) tablet 100 mg   OR Linked Order Group    acetaminophen  (TYLENOL ) tablet 650 mg    acetaminophen  (TYLENOL ) suppository 650 mg   sodium chloride  flush (NS) 0.9 % injection 3 mL   sodium chloride  flush (NS) 0.9 % injection 3 mL   0.9 %  sodium chloride  infusion   pantoprazole  (PROTONIX ) injection  40 mg   methylPREDNISolone  sodium succinate (SOLU-MEDROL ) 40 mg/mL injection 40 mg    IV  methylprednisolone  will be converted to either a q12h or q24h frequency with the same total daily dose (TDD).  Ordered Dose: 1 to 125 mg TDD; convert to: TDD q24h.  Ordered Dose: 126 to 250 mg TDD; convert to: TDD div q12h.  Ordered Dose: >250 mg TDD; DAW.   furosemide  (LASIX ) injection 40 mg   insulin  aspart (novoLOG ) injection 0-9 Units    Correction coverage::   Sensitive (thin, NPO, renal)    CBG < 70::   Implement Hypoglycemia Standing Orders and refer to Hypoglycemia Standing Orders sidebar report    CBG 70 - 120::   0 units    CBG 121 - 150::   1 unit    CBG 151 - 200::   2 units    CBG 201 - 250::   3 units    CBG 251 - 300::   5 units    CBG 301 - 350::   7 units    CBG 351 - 400:   9 units    CBG > 400:   call MD and obtain STAT lab verification     Orders Placed This Encounter  Procedures   Resp panel by RT-PCR (RSV, Flu A&B, Covid) Anterior Nasal Swab   Blood Culture (routine x 2)   Expectorated Sputum Assessment w Gram Stain, Rflx to Resp Cult   Respiratory (~20 pathogens) panel by PCR   DG Chest Port 1 View   CT Angio Chest PE W and/or Wo Contrast   Comprehensive metabolic panel   CBC with Differential   Protime-INR   APTT   Urinalysis, w/ Reflex to Culture (Infection Suspected) -Urine, Clean Catch   Procalcitonin   Strep pneumoniae urinary antigen   Comprehensive metabolic panel   CBC   Magnesium    Legionella Pneumophila Serogp 1 Ur Ag   Hemoglobin A1c   Diet Carb Modified Fluid consistency: Thin; Room service appropriate? Yes   Document height and weight   Assess and Document Glasgow Coma Scale   Document vital signs within 1-hour of fluid bolus completion. Notify provider of abnormal vital signs despite fluid resuscitation.   DO NOT delay antibiotics if unable to obtain blood culture.   Refer to Sidebar Report: Sepsis Sidebar ED/IP   Notify provider for difficulties  obtaining IV access.   Insert peripheral IV x 2   Initiate Carrier Fluid Protocol   Strict intake and output   Daily weights   Apply Pneumonia Care Plan   Cardiac Monitoring Continuous x 24 hours Indications for use: Other; other indications for use: PNA   Vital signs   Notify physician (specify)   Refer to Sidebar Report Refer to ICU, Med-Surg, Progressive, and Step-Down Mobility Protocol Sidebars   Initiate Adult Central Line Maintenance and Catheter Protocol for patients with central line (CVC, PICC, Port, Hemodialysis, Trialysis)   If patient diabetic or glucose greater than 140 notify physician for Sliding Scale Insulin  Orders   Intake and Output   Do not place and if present remove PureWick   Initiate Oral Care Protocol   Initiate Carrier Fluid Protocol   RN may order General Admission PRN Orders utilizing General Admission PRN medications (through manage orders) for the following patient needs: allergy symptoms (Claritin), cold sores (Carmex), cough (Robitussin DM), eye irritation (Liquifilm Tears), hemorrhoids (Tucks), indigestion (Maalox), minor skin irritation (Hydrocortisone Cream), muscle pain (Ben Gay), nose irritation (saline nasal spray) and sore throat (Chloraseptic spray).   Ambulate with assistance   Apply Diabetes Mellitus Care Plan   STAT  CBG when hypoglycemia is suspected. If treated, recheck every 15 minutes after each treatment until CBG >/= 70 mg/dl   Refer to Hypoglycemia Protocol Sidebar Report for treatment of CBG < 70 mg/dl   No HS correction Insulin    Full code   Code Sepsis activation.  This occurs automatically when order is signed and prioritizes pharmacy, lab, and radiology services for STAT collections and interventions.  If [facility] downtime, call [facility] [phone]) to activate Code Sepsis.   Consult to hospitalist   monitoring by pharmacy   CeFEPIme  (MAXIPIME ) per pharmacy consult            vancomycin  per pharmacy consult   APIXABAN  (ELIQUIS ) PER  PHARMACY CONSULT   Consult to respiratory care treatment (RT)   Droplet precaution   OT eval and treat   PT eval and treat   Oxygen therapy Mode or (Route): High flow nasal cannula; Humidity: Yes   Pulse oximetry check with vital signs   Oxygen therapy Mode or (Route): Nasal cannula; Liters Per Minute: 2; Keep O2 saturation between: greater than 92 %   Incentive spirometry   CPAP   I-Stat venous blood gas, ([facility], [facility], [facility])   ED EKG   Admit to Inpatient (patient's expected length of stay will be greater than 2 midnights or inpatient only procedure)   Aspiration precautions   Fall precautions    Author: Mario LULLA Blanch, MD 12 pm- 8 pm. [facility]. 02/25/2024 7:14 PM Please note for any communication after hours contact [facility] Assigned provider on call on Amion.

## 2024-02-26 ENCOUNTER — Encounter (HOSPITAL_COMMUNITY): Payer: Self-pay | Admitting: Internal Medicine

## 2024-02-26 ENCOUNTER — Other Ambulatory Visit: Payer: Self-pay

## 2024-02-26 DIAGNOSIS — J9621 Acute and chronic respiratory failure with hypoxia: Secondary | ICD-10-CM | POA: Diagnosis not present

## 2024-02-26 DIAGNOSIS — J8410 Pulmonary fibrosis, unspecified: Secondary | ICD-10-CM

## 2024-02-26 DIAGNOSIS — J189 Pneumonia, unspecified organism: Secondary | ICD-10-CM | POA: Diagnosis not present

## 2024-02-26 DIAGNOSIS — J90 Pleural effusion, not elsewhere classified: Secondary | ICD-10-CM

## 2024-02-26 DIAGNOSIS — I2694 Multiple subsegmental pulmonary emboli without acute cor pulmonale: Secondary | ICD-10-CM | POA: Diagnosis not present

## 2024-02-26 DIAGNOSIS — J849 Interstitial pulmonary disease, unspecified: Secondary | ICD-10-CM | POA: Diagnosis not present

## 2024-02-26 LAB — COMPREHENSIVE METABOLIC PANEL WITH GFR
ALT: 18 U/L (ref 0–44)
AST: 27 U/L (ref 15–41)
Albumin: 2.2 g/dL — ABNORMAL LOW (ref 3.5–5.0)
Alkaline Phosphatase: 46 U/L (ref 38–126)
Anion gap: 15 (ref 5–15)
BUN: 21 mg/dL (ref 8–23)
CO2: 17 mmol/L — ABNORMAL LOW (ref 22–32)
Calcium: 8.4 mg/dL — ABNORMAL LOW (ref 8.9–10.3)
Chloride: 107 mmol/L (ref 98–111)
Creatinine, Ser: 1.59 mg/dL — ABNORMAL HIGH (ref 0.44–1.00)
GFR, Estimated: 34 mL/min — ABNORMAL LOW (ref >60)
Glucose, Bld: 191 mg/dL — ABNORMAL HIGH (ref 70–99)
Potassium: 4.5 mmol/L (ref 3.5–5.1)
Sodium: 139 mmol/L (ref 135–145)
Total Bilirubin: 1.1 mg/dL (ref 0.0–1.2)
Total Protein: 6.5 g/dL (ref 6.5–8.1)

## 2024-02-26 LAB — RESPIRATORY PANEL BY PCR

## 2024-02-26 LAB — PROCALCITONIN: Procalcitonin: 0.44 ng/mL

## 2024-02-26 LAB — GLUCOSE, CAPILLARY
Glucose-Capillary: 177 mg/dL — ABNORMAL HIGH (ref 70–99)
Glucose-Capillary: 203 mg/dL — ABNORMAL HIGH (ref 70–99)
Glucose-Capillary: 191 mg/dL — ABNORMAL HIGH (ref 70–99)
Glucose-Capillary: 240 mg/dL — ABNORMAL HIGH (ref 70–99)

## 2024-02-26 LAB — CBC
HCT: 29.2 % — ABNORMAL LOW (ref 36.0–46.0)
Hemoglobin: 9.2 g/dL — ABNORMAL LOW (ref 12.0–15.0)
MCH: 30 pg (ref 26.0–34.0)
MCHC: 31.5 g/dL (ref 30.0–36.0)
MCV: 95.1 fL (ref 80.0–100.0)
Platelets: 454 K/uL — ABNORMAL HIGH (ref 150–400)
RBC: 3.07 MIL/uL — ABNORMAL LOW (ref 3.87–5.11)
RDW: 13.9 % (ref 11.5–15.5)
WBC: 16.9 K/uL — ABNORMAL HIGH (ref 4.0–10.5)
nRBC: 0 % (ref 0.0–0.2)

## 2024-02-26 LAB — PROTHROMBIN GENE MUTATION

## 2024-02-26 LAB — HEMOGLOBIN A1C
Hgb A1c MFr Bld: 5.6 % (ref 4.8–5.6)
Mean Plasma Glucose: 114.02 mg/dL

## 2024-02-26 LAB — FACTOR 5 LEIDEN

## 2024-02-26 LAB — MAGNESIUM: Magnesium: 1.8 mg/dL (ref 1.7–2.4)

## 2024-02-26 MED ORDER — MAGNESIUM SULFATE 4 GM/100ML IV SOLN
4.0000 g | Freq: Once | INTRAVENOUS | Status: AC
  Administered 2024-02-26: 4 g via INTRAVENOUS
  Filled 2024-02-26: qty 100

## 2024-02-26 MED ORDER — ORAL CARE MOUTH RINSE
15.0000 mL | OROMUCOSAL | Status: DC | PRN
Start: 2024-02-26 — End: 2024-03-01

## 2024-02-26 MED ORDER — SODIUM BICARBONATE 650 MG PO TABS
650.0000 mg | ORAL_TABLET | Freq: Two times a day (BID) | ORAL | Status: DC
  Administered 2024-02-26 – 2024-03-06 (×17): 650 mg via ORAL
  Filled 2024-02-26 (×18): qty 1

## 2024-02-26 MED ORDER — FLUTICASONE FUROATE-VILANTEROL 200-25 MCG/ACT IN AEPB
1.0000 | INHALATION_SPRAY | Freq: Every day | RESPIRATORY_TRACT | Status: DC
  Administered 2024-02-27 – 2024-03-06 (×6): 1 via RESPIRATORY_TRACT
  Filled 2024-02-26: qty 28

## 2024-02-26 MED ORDER — FUROSEMIDE 10 MG/ML IJ SOLN
40.0000 mg | Freq: Every day | INTRAMUSCULAR | Status: DC
  Administered 2024-02-27: 40 mg via INTRAVENOUS
  Filled 2024-02-26: qty 4

## 2024-02-26 NOTE — Progress Notes (Signed)
 Progress Note   Patient: Kathryn Scott FMW:[id number] DOB: 04-12-1946 DOA: 02/25/2024     1 DOS: the patient was seen and examined on 02/26/2024   Brief hospital course: Kathryn Scott is a 78 y.o. female with past medical history  of pulmonary embolism, idiopathic pulmonary fibrosis, community-acquired pneumonia, acute kidney injury, essential hypertension, diabetes mellitus type 2, acquired hypothyroidism presented for evaluation of respiratory distress.  She was recently admitted on the 17th and discharged on the 21st found to have a pulmonary embolism.  Patient was admitted for respiratory failure, interstitial pulmonary fibrosis, presumed pneumonia  Assessment and Plan: Acute on chronic hypoxic respiratory failure Recurrent pneumonia Continue broad-spectrum IV antibiotics vancomycin , cefepime  Check urine Legionella, pneumococcal Continue IV Solu-Medrol  40 mg every 12 Continue IV Lasix  40 every 12 regimen. Ordered Mucinex . She was recently discharged on 3L with rest, 4L with activity. Will continue to wean her O2 as tolerated.  Interstitial pulmonary fibrosis: Pulmonologist follow-up appreciated, symptoms likely due to flareup of pulmonary fibrosis. Continue broad-spectrum antibiotics. DuoNebs, supplemental oxygen, Breo Ellipta . Will follow pulmonary recommendations.  Pulmonary embolism: Recent diagnosis of PE, continue Eliquis  therapy.  Pulmonary hypertension: Due to PE.  Pulmonologist recommended repeat echo once stable. Continue IV Lasix  40 every 12.  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 Presented with creatinine of 1.59. Continue to monitor daily renal function. Avoid nephrotoxic drugs. Metabolic acidosis noted.  Will give sodium bicarb tabs.  Type 2 diabetes mellitus: Continue Accu-Cheks, sliding scale insulin .  Hypomagnesemia- IV mag supplements ordered.  Hypothyroidism: Continue  home dose Synthroid .      Out of bed to chair. Incentive spirometry. Nursing supportive care. Fall, aspiration precautions. Diet:  Diet Orders (From admission, onward)     Start     Ordered   02/25/24 1855  Diet Carb Modified Fluid consistency: Thin; Room service appropriate? Yes  Diet effective now       Question Answer Comment  Diet-HS Snack? Nothing   Calorie Level Medium 1600-2000   Fluid consistency: Thin   Room service appropriate? Yes      02/25/24 1854           DVT prophylaxis:  apixaban  (ELIQUIS ) tablet 10 mg  apixaban  (ELIQUIS ) tablet 5 mg  Level of care: Progressive   Code Status: Full Code  Subjective: Patient is seen and examined today morning. I decreased supplemental O2 from 10 to 5 L, she is saturating 94%. Poor appetite. Did not get out of bed.  Physical Exam: Vitals:   02/26/24 0800 02/26/24 0803 02/26/24 0818 02/26/24 0848  BP:  (!) 123/57  123/66  Pulse: 72 67 65 72  Resp:    (!) 22  Temp:      TempSrc:      SpO2: 96% 97% 97%   Weight:      Height:        General - Elderly Caucasian female, mild respiratory distress HEENT - PERRLA, EOMI, atraumatic head, non tender sinuses. Lung - Clear, diffuse rales, rhonchi, no wheezes. Heart - S1, S2 heard, no murmurs, rubs, trace pedal edema. Abdomen - Soft, non tender, bowel sounds good Neuro - Alert, awake and oriented x 3, non focal exam. Skin - Warm and dry.  Data Reviewed:      Latest Ref Rng & Units 02/26/2024    3:59 AM 02/25/2024    2:05 PM 02/25/2024    1:43 PM  CBC  WBC 4.0 - 10.5 K/uL 16.9  15.3   Hemoglobin 12.0 - 15.0 g/dL 9.2  88.7  89.6   Hematocrit 36.0 - 46.0 % 29.2  33.0  32.9   Platelets 150 - 400 K/uL 454   484       Latest Ref Rng & Units 02/26/2024    3:59 AM 02/25/2024    2:05 PM 02/25/2024    1:43 PM  BMP  Glucose 70 - 99 mg/dL 808   832   BUN 8 - 23 mg/dL 21   14   Creatinine 9.55 - 1.00 mg/dL 8.40   8.66   Sodium 864 - 145 mmol/L 139  140  137   Potassium  3.5 - 5.1 mmol/L 4.5  4.8  4.8   Chloride 98 - 111 mmol/L 107   106   CO2 22 - 32 mmol/L 17   20   Calcium  8.9 - 10.3 mg/dL 8.4   8.8    CT Angio Chest PE W and/or Wo Contrast Result Date: 02/25/2024 CLINICAL DATA:  Follow-up PE, increased oxygen demand EXAM: CT ANGIOGRAPHY CHEST WITH CONTRAST TECHNIQUE: Multidetector CT imaging of the chest was performed using the standard protocol during bolus administration of intravenous contrast. Multiplanar CT image reconstructions and MIPs were obtained to evaluate the vascular anatomy. RADIATION DOSE REDUCTION: This exam was performed according to the departmental dose-optimization program which includes automated exposure control, adjustment of the mA and/or kV according to patient size and/or use of iterative reconstruction technique. CONTRAST:  60mL OMNIPAQUE  IOHEXOL  350 MG/ML SOLN COMPARISON:  Chest x-ray 02/25/2024, chest CT 02/19/2024, CT chest 07/25/2023 FINDINGS: Cardiovascular: Satisfactory opacification of the pulmonary arteries to the segmental level. No evidence of pulmonary embolism. Previously noted small right upper lobe embolus is no longer visualized. Moderate aortic atherosclerosis. No aneurysm or dissection. Borderline to mild cardiomegaly. No pericardial effusion Mediastinum/Nodes: Patent trachea. No thyroid mass. Prominent right paratracheal node measuring up to 14 mm. AP window nodes measuring up to 9 mm. Right hilar nodes measuring up to 17 mm. Esophagus within normal limits. Lungs/Pleura: Underlying chronic lung disease and fibrosis. Increased small left and small moderate right pleural effusions compared to prior. Worsened diffuse bilateral heterogeneous ground-glass airspace disease throughout the lungs. No pneumothorax. Upper Abdomen: No acute finding Musculoskeletal: No acute osseous abnormality. Review of the MIP images confirms the above findings. IMPRESSION: 1. Negative for acute pulmonary embolus. Previously noted small right upper lobe  embolus is no longer visualized. 2. Underlying chronic lung disease and fibrosis. Worsened diffuse bilateral heterogeneous ground-glass airspace disease throughout the lungs, suspect for worsened edema or infection. Increased small left and small to moderate right pleural effusions. 3. Mild mediastinal and right hilar adenopathy, likely reactive, slightly increased. 4. Aortic atherosclerosis. Aortic Atherosclerosis (ICD10-I70.0). Electronically Signed   By: Luke Bun M.D.   On: 02/25/2024 16:03   DG Chest Port 1 View Result Date: 02/25/2024 CLINICAL DATA:  Questionable sepsis-evaluate for abnormality. EXAM: PORTABLE CHEST 1 VIEW COMPARISON:  Radiographs 02/19/2024 and 08/23/2021. CT 02/19/2024 and 07/25/2023. FINDINGS: 1418 hours. The heart size and mediastinal contours are stable with aortic atherosclerosis. Background diffuse interstitial thickening characterized as probable UIP on previous high-resolution chest CT. Compared with the recent prior studies, there is increased diffuse interstitial thickening, suspicious for edema or atypical infection. The focal airspace disease inferiorly in the right upper lobe has partially cleared. No evidence of pneumothorax or significant pleural effusion. No acute osseous findings. Telemetry leads overlie the chest. IMPRESSION: 1. Increased diffuse interstitial thickening suspicious for edema or atypical infection superimposed  on chronic interstitial lung disease. Continued follow-up recommended. 2. Partial clearing of focal airspace disease inferiorly in the right upper lobe. Electronically Signed   By: Elsie Perone M.D.   On: 02/25/2024 14:33    Family Communication: Discussed with patient, she understand and agree. All questions answered.  Disposition: Status is: Inpatient Remains inpatient appropriate because: hypoxic, iv steroids, iv abx, lasix . debility  Planned Discharge Destination: Home with Home Health     Time spent: 43  minutes  Author: Concepcion Riser, MD 02/26/2024 10:30 AM Secure chat 7am to 7pm For on call review [email or web address].

## 2024-02-26 NOTE — Plan of Care (Signed)
  Problem: Education: Goal: Ability to describe self-care measures that may prevent or decrease complications (Diabetes Survival Skills Education) will improve Outcome: Progressing   Problem: Coping: Goal: Ability to adjust to condition or change in health will improve Outcome: Progressing   Problem: Fluid Volume: Goal: Ability to maintain a balanced intake and output will improve Outcome: Progressing   Problem: Health Behavior/Discharge Planning: Goal: Ability to identify and utilize available resources and services will improve Outcome: Progressing Goal: Ability to manage health-related needs will improve Outcome: Progressing   Problem: Metabolic: Goal: Ability to maintain appropriate glucose levels will improve Outcome: Progressing   Problem: Nutritional: Goal: Maintenance of adequate nutrition will improve Outcome: Progressing   Problem: Skin Integrity: Goal: Risk for impaired skin integrity will decrease Outcome: Progressing   Problem: Nutrition: Goal: Adequate nutrition will be maintained Outcome: Progressing   Problem: Clinical Measurements: Goal: Ability to maintain clinical measurements within normal limits will improve Outcome: Not Met (add Reason) Goal: Cardiovascular complication will be avoided Outcome: Not Met (add Reason)   Problem: Coping: Goal: Level of anxiety will decrease Outcome: Not Met (add Reason)   Problem: Activity: Goal: Ability to tolerate increased activity will improve Outcome: Not Met (add Reason)

## 2024-02-26 NOTE — Plan of Care (Signed)
  Problem: Education: Goal: Ability to describe self-care measures that may prevent or decrease complications (Diabetes Survival Skills Education) will improve 02/26/2024 0238 by David Towson C, RN Outcome: Progressing 02/26/2024 0214 by Karolee Drucella BROCKS, RN Outcome: Progressing   Problem: Coping: Goal: Ability to adjust to condition or change in health will improve 02/26/2024 0238 by Adisa Litt C, RN Outcome: Progressing 02/26/2024 0214 by Karolee Drucella BROCKS, RN Outcome: Progressing   Problem: Fluid Volume: Goal: Ability to maintain a balanced intake and output will improve 02/26/2024 0238 by Darnella Zeiter C, RN Outcome: Progressing 02/26/2024 0214 by Karolee Drucella BROCKS, RN Outcome: Progressing   Problem: Health Behavior/Discharge Planning: Goal: Ability to identify and utilize available resources and services will improve 02/26/2024 0238 by Lamine Laton C, RN Outcome: Progressing 02/26/2024 0214 by Karolee Drucella BROCKS, RN Outcome: Progressing Goal: Ability to manage health-related needs will improve 02/26/2024 0238 by Karolee Drucella BROCKS, RN Outcome: Progressing 02/26/2024 0214 by Karolee Drucella BROCKS, RN Outcome: Progressing   Problem: Metabolic: Goal: Ability to maintain appropriate glucose levels will improve 02/26/2024 0238 by Mitzy Naron C, RN Outcome: Progressing 02/26/2024 0214 by Karolee Drucella BROCKS, RN Outcome: Progressing   Problem: Nutritional: Goal: Maintenance of adequate nutrition will improve 02/26/2024 0238 by Chene Kasinger C, RN Outcome: Progressing 02/26/2024 0214 by Karolee Drucella BROCKS, RN Outcome: Progressing   Problem: Skin Integrity: Goal: Risk for impaired skin integrity will decrease Outcome: Progressing   Problem: Health Behavior/Discharge Planning: Goal: Ability to manage health-related needs will improve Outcome: Progressing   Problem: Nutrition: Goal: Adequate nutrition will be  maintained 02/26/2024 0238 by Cire Clute C, RN Outcome: Progressing 02/26/2024 0214 by Karolee Drucella BROCKS, RN Outcome: Progressing   Problem: Coping: Goal: Level of anxiety will decrease 02/26/2024 0238 by Eileen Kangas C, RN Outcome: Progressing 02/26/2024 0214 by Karolee Drucella BROCKS, RN Outcome: Not Met (add Reason)   Problem: Clinical Measurements: Goal: Ability to maintain clinical measurements within normal limits will improve Outcome: Not Met (add Reason) Goal: Cardiovascular complication will be avoided Outcome: Not Met (add Reason)   Problem: Activity: Goal: Ability to tolerate increased activity will improve Outcome: Not Met (add Reason)

## 2024-02-26 NOTE — Progress Notes (Signed)
 NAME:  Kathryn Scott, MRN:  [id number], DOB:  [birth year], LOS: 1 ADMISSION DATE:  02/25/2024, CONSULTATION DATE: 02/25/2024 REFERRING MD: CHARLENA Blanch, MD, CHIEF COMPLAINT: Acute hypoxic respiratory failure  History of Present Illness:   The patient, with idiopathic pulmonary fibrosis, presents with worsening respiratory symptoms following a recent hospitalization for pneumonia and pulmonary embolism. She is accompanied by her daughter, Rojelio.  Dyspnea and hypoxemia - Worsening shortness of breath following recent hospitalization for pneumonia and pulmonary embolism - Oxygen saturation drops to 70-71% during activities such as talking or going to the restroom, even when oxygen concentrator increased to five liters - Currently requiring 18 liters of oxygen - Discharged from hospital on Monday with instructions for oxygen therapy at three liters at rest and four liters during activity  Cough and hemoptysis - Cough with bloody mucus developed this morning  Fever - Fever developed this morning to 101  Recent hospitalization and cardiac findings - Admitted last Wednesday morning and discharged on Monday - Diagnosed with pneumonia, small pulmonary embolism, and suspected atrial fibrillation during hospitalization - Echocardiogram performed during last hospital visit showed moderate pulmonary HTN - Previous cardiac ultrasound in January of 2025 did not show any evidence of pulmonary hypertension  Idiopathic pulmonary fibrosis management - Receiving treatment with Ofev , taken consistently at home  Anticoagulation therapy - On Eliquis  for small pulmonary embolism, started at last admission  Peripheral edema - No swelling in feet  Pertinent  Medical History    has a past medical history of Asthma, Diabetes mellitus without complication (HCC), and Hypertension.   Significant Hospital Events: Including procedures, antibiotic start and stop dates in addition to other pertinent  events   7/23 pccm consult for Aoc hypoxia ?ILD flare   Interim History / Subjective:  Viral w/u neg  Feels a little better this morning   Objective    Blood pressure 124/61, pulse 68, temperature 97.7 F (36.5 C), temperature source Oral, resp. rate (!) 24, height 5' 6 (1.676 m), weight 68.7 kg, SpO2 92%.        Intake/Output Summary (Last 24 hours) at 02/26/2024 1307 Last data filed at 02/26/2024 0535 Gross per 24 hour  Intake --  Output 250 ml  Net -250 ml   Filed Weights   02/25/24 1330 02/26/24 0503  Weight: 68.9 kg 68.7 kg    Examination: Gen: wdwn older adult F NAD  HEENT:  NCAT pink mm  Lungs: fine crackles  CV:  rr  Abd:  soft ndnt  Ext: no obvious acute joint deformity  Neuro:  AAOx3  Psych:  normal mood and affect     CTA 02/19/2024-segmental pulmonary embolism in the right upper lobe, new airspace consolidation, groundglass opacities in the right upper lobe, small right pleural effusion  CTA 02/24/2021-negative for pulmonary embolism.  Worsening diffuse bilateral heterogeneous groundglass opacities, mild adenopathy.  Echocardiogram 02/20/2024-LVEF 65-70%.  RV systolic function is normal, moderate elevation in PA systolic pressure  Resolved problem list   Assessment and Plan   AoC hypoxic resp failure  CAP IPF - less favored to be a flare  Segmental PE RUL  Small R pleural effusion  pHTN P -send PCT  -cont abx -- on vanc and cefepime   -cont steroids  -cont BID lasix   -CPAP at night -- having a hard time w hospital mask  -wean O2 as able -Pulm hygiene, IS, mobility   Best Practice (right click and Reselect all SmartList Selections daily)   Per primary team  Critical care  time:    Ronnald Gave MSN, AGACNP-BC [facility] Amion for pager  02/26/2024, 1:07 PM

## 2024-02-26 NOTE — Evaluation (Signed)
 Physical Therapy Evaluation Patient Details Name: Kathryn Scott MRN: [id number] DOB: [birth year] Today's Date: 02/26/2024  History of Present Illness  Pt is a 78 y.o. female admitted 7/23 for SOB. Recently hospitialization 717 for pneumonia, d/c'd home with 3L O2. CT negative for PE, worsening bil opacities.  PMH: asthma, pulmonary fibrosis, HTN, CAD, hypothyroidism, DM2,  Clinical Impression  Pt admitted with/for worsening SOB.  Pt more deconditioned and needing Supervision for mobility/gait.  Pt currently limited functionally due to the problems listed below.  (see problems list.)  Pt will benefit from PT to maximize function and safety to be able to get home safely with available assist.  She will benefit from HHPT for reconditioning.         If plan is discharge home, recommend the following:  (PRN assist by husband)   Can travel by private vehicle        Equipment Recommendations None recommended by PT  Recommendations for Other Services       Functional Status Assessment Patient has had a recent decline in their functional status and demonstrates the ability to make significant improvements in function in a reasonable and predictable amount of time.     Precautions / Restrictions Precautions Precautions: Fall Recall of Precautions/Restrictions: Intact      Mobility  Bed Mobility     Rolling: Modified independent (Device/Increase time), Used rails (HOB raised)              Transfers Overall transfer level: Needs assistance   Transfers: Sit to/from Stand Sit to Stand: Supervision           General transfer comment: cues for improved safety/hand placement    Ambulation/Gait Ambulation/Gait assistance: Supervision Gait Distance (Feet): 15 Feet (x2 with RW) Assistive device: Rolling walker (2 wheels) Gait Pattern/deviations: Step-through pattern   Gait velocity interpretation: <1.8 ft/sec, indicate of risk for recurrent falls   General Gait  Details: generally steady, slower cadence.  Used RW for conservation of energy/pulmonary status assist.  Pt started at 6 L [place] and need rose to 12L to bring sats from low of around 80% to lower 90's of ~5 min.  HR in the 70's  Stairs            Wheelchair Mobility     Tilt Bed    Modified Rankin (Stroke Patients Only)       Balance Overall balance assessment: Needs assistance Sitting-balance support: Single extremity supported, Feet supported Sitting balance-Leahy Scale: Normal     Standing balance support: No upper extremity supported, During functional activity, Single extremity supported Standing balance-Leahy Scale: Fair Standing balance comment: based on assist/balance during replacing purewick without UE assist for stability                             Pertinent Vitals/Pain Pain Assessment Pain Assessment: No/denies pain    Home Living Family/patient expects to be discharged to:: Private residence Living Arrangements: Spouse/significant other Available Help at Discharge: Family;Available 24 hours/day Type of Home: House Home Access: Stairs to enter Entrance Stairs-Rails: Right;Left;Can reach both Entrance Stairs-Number of Steps: 3   Home Layout: One level Home Equipment: Grab bars - tub/shower;Other (comment) (grab bar is a suction cup type) Additional Comments: Oxygen prn PTA, has CPAP - wears at night, pulse oximeter    Prior Function Prior Level of Function : Independent/Modified Independent;Driving             Mobility Comments: Ind  ADLs Comments: Ind with ADLs/selfcare.Spouse helps some physically, pt does some cooking and all cleaning, groceries     Extremity/Trunk Assessment   Upper Extremity Assessment Upper Extremity Assessment: Overall WFL for tasks assessed    Lower Extremity Assessment Lower Extremity Assessment: Defer to PT evaluation    Cervical / Trunk Assessment Cervical / Trunk Assessment: Normal  Communication    Communication Communication: Impaired Factors Affecting Communication: Hearing impaired    Cognition Arousal: Alert Behavior During Therapy: WFL for tasks assessed/performed   PT - Cognitive impairments: No apparent impairments                         Following commands: Intact       Cueing       General Comments General comments (skin integrity, edema, etc.): O2 Destating to 81% on 8L, required up to 12L to recover    Exercises     Assessment/Plan    PT Assessment Patient needs continued PT services  PT Problem List Decreased activity tolerance;Decreased mobility;Cardiopulmonary status limiting activity       PT Treatment Interventions Gait training;Stair training;Functional mobility training;Therapeutic activities;Patient/family education    PT Goals (Current goals can be found in the Care Plan section)  Acute Rehab PT Goals Patient Stated Goal: home PT Goal Formulation: With patient Time For Goal Achievement: 03/11/24 Potential to Achieve Goals: Good    Frequency Min 2X/week     Co-evaluation               AM-PAC PT 6 Clicks Mobility  Outcome Measure Help needed turning from your back to your side while in a flat bed without using bedrails?: None Help needed moving from lying on your back to sitting on the side of a flat bed without using bedrails?: None Help needed moving to and from a bed to a chair (including a wheelchair)?: A Little Help needed standing up from a chair using your arms (e.g., wheelchair or bedside chair)?: A Little Help needed to walk in hospital room?: A Little Help needed climbing 3-5 steps with a railing? : A Little 6 Click Score: 20    End of Session Equipment Utilized During Treatment: Oxygen Activity Tolerance: Patient tolerated treatment well;Other (comment) (limited by O2 need.) Patient left: in bed;with call bell/phone within reach Nurse Communication: Mobility status PT Visit Diagnosis: Muscle weakness  (generalized) (M62.81);Difficulty in walking, not elsewhere classified (R26.2)    Time: 8375-8350 PT Time Calculation (min) (ACUTE ONLY): 25 min   Charges:   PT Evaluation $PT Eval Moderate Complexity: 1 Mod PT Treatments $Gait Training: 8-22 mins PT General Charges $$ ACUTE PT VISIT: 1 Visit         02/26/2024  [place] HERO., PT Acute Rehabilitation Services [phone]  (office)  Vinie GAILS Amirra Herling 02/26/2024, 6:01 PM

## 2024-02-26 NOTE — Plan of Care (Signed)
  Problem: Education: Goal: Ability to describe self-care measures that may prevent or decrease complications (Diabetes Survival Skills Education) will improve Outcome: Progressing Goal: Individualized Educational Video(s) Outcome: Progressing   Problem: Coping: Goal: Ability to adjust to condition or change in health will improve Outcome: Progressing   Problem: Fluid Volume: Goal: Ability to maintain a balanced intake and output will improve Outcome: Progressing   Problem: Health Behavior/Discharge Planning: Goal: Ability to identify and utilize available resources and services will improve Outcome: Progressing   Problem: Health Behavior/Discharge Planning: Goal: Ability to manage health-related needs will improve Outcome: Progressing   Problem: Metabolic: Goal: Ability to maintain appropriate glucose levels will improve Outcome: Progressing   Problem: Nutritional: Goal: Maintenance of adequate nutrition will improve Outcome: Progressing Goal: Progress toward achieving an optimal weight will improve Outcome: Progressing   Problem: Skin Integrity: Goal: Risk for impaired skin integrity will decrease Outcome: Progressing   Problem: Education: Goal: Knowledge of General Education information will improve Description: Including pain rating scale, medication(s)/side effects and non-pharmacologic comfort measures Outcome: Progressing   Problem: Health Behavior/Discharge Planning: Goal: Ability to manage health-related needs will improve Outcome: Progressing   Problem: Clinical Measurements: Goal: Ability to maintain clinical measurements within normal limits will improve Outcome: Progressing Goal: Will remain free from infection Outcome: Progressing Goal: Diagnostic test results will improve Outcome: Progressing Goal: Respiratory complications will improve Outcome: Progressing Goal: Cardiovascular complication will be avoided Outcome: Progressing   Problem:  Activity: Goal: Risk for activity intolerance will decrease Outcome: Progressing

## 2024-02-26 NOTE — Evaluation (Signed)
 Occupational Therapy Evaluation Patient Details Name: Kathryn Scott MRN: [id number] DOB: 1946-06-19 Today's Date: 02/26/2024   History of Present Illness   Pt is a 78 y.o. female admitted 7/23 for SOB. Recently hospitialization 717 for pneumonia, d/c'd home with 3L O2. CT negative for PE, worsening bil opacities.  PMH: asthma, pulmonary fibrosis, HTN, CAD, hypothyroidism, DM2,     Clinical Impressions Pt admitted based on above, and was seen based on problem list below. PTA pt was independent with ADLs and IADLs. Today pt is requiring s for safety for ADLs. Bed mobility was mod I and functional transfers are  s for safety with RW. Pt presenting with significantly decreased activity tolerance requiring up to 12L O2 for mobility. Educated pt on benefits of BSC and shower seat to assist with activity tolerance levels. Anticipate pt will progress well no follow up OT needs. OT will continue to follow acutely to maximize functional independence.     If plan is discharge home, recommend the following:   A little help with walking and/or transfers;Assistance with cooking/housework     Functional Status Assessment   Patient has had a recent decline in their functional status and demonstrates the ability to make significant improvements in function in a reasonable and predictable amount of time.     Equipment Recommendations   BSC/3in1;Tub/shower seat      Precautions/Restrictions   Precautions Precautions: Fall Recall of Precautions/Restrictions: Intact Restrictions Weight Bearing Restrictions Per Provider Order: No     Mobility Bed Mobility Overal bed mobility: Modified Independent   Rolling:  (HOB raised)        Transfers Overall transfer level: Needs assistance   Transfers: Sit to/from Stand, Bed to chair/wheelchair/BSC Sit to Stand: Supervision     Step pivot transfers: Supervision     General transfer comment: S for safety with managing lines       Balance Overall balance assessment: Needs assistance Sitting-balance support: Single extremity supported, Feet supported Sitting balance-Leahy Scale: Normal     Standing balance support: No upper extremity supported, During functional activity, Single extremity supported Standing balance-Leahy Scale: Fair Standing balance comment: benefits from UE support       ADL either performed or assessed with clinical judgement   ADL Overall ADL's : Needs assistance/impaired Eating/Feeding: Set up;Sitting   Grooming: Supervision/safety;Standing           Upper Body Dressing : Set up;Sitting   Lower Body Dressing: Supervision/safety;Sit to/from stand   Toilet Transfer: Supervision/safety;Ambulation;Grab bars;Rolling walker (2 wheels)   Toileting- Clothing Manipulation and Hygiene: Supervision/safety;Sitting/lateral lean       Functional mobility during ADLs: Supervision/safety;Rolling walker (2 wheels) General ADL Comments: Pt mobilizing well but requiring up to 12L O2     Vision Baseline Vision/History: 1 Wears glasses Vision Assessment?: No apparent visual deficits            Pertinent Vitals/Pain Pain Assessment Pain Assessment: No/denies pain     Extremity/Trunk Assessment Upper Extremity Assessment Upper Extremity Assessment: Overall WFL for tasks assessed   Lower Extremity Assessment Lower Extremity Assessment: Defer to PT evaluation   Cervical / Trunk Assessment Cervical / Trunk Assessment: Normal   Communication Communication Communication: Impaired Factors Affecting Communication: Hearing impaired   Cognition Arousal: Alert Behavior During Therapy: WFL for tasks assessed/performed Cognition: No apparent impairments     Following commands: Intact       Cueing  General Comments   Cueing Techniques: Verbal cues  O2 Destating to 81% on 8L, required up  to 12L to recover           Home Living Family/patient expects to be discharged to::  Private residence Living Arrangements: Spouse/significant other Available Help at Discharge: Family;Available 24 hours/day Type of Home: House Home Access: Stairs to enter [employer] of Steps: 3 Entrance Stairs-Rails: Right;Left;Can reach both Home Layout: One level     Bathroom Shower/Tub: Producer, television/film/video: Standard Bathroom Accessibility: Yes How Accessible: Accessible via walker Home Equipment: Grab bars - tub/shower;Other (comment) (grab bar is a suction cup type)   Additional Comments: Oxygen prn PTA, has CPAP - wears at night, pulse oximeter      Prior Functioning/Environment Prior Level of Function : Independent/Modified Independent;Driving     Mobility Comments: Ind ADLs Comments: Ind with ADLs/selfcare.Spouse helps some physically, pt does some cooking and all cleaning, groceries    OT Problem List: Decreased activity tolerance;Decreased knowledge of use of DME or AE   OT Treatment/Interventions: Self-care/ADL training;Therapeutic exercise;Energy conservation;DME and/or AE instruction;Therapeutic activities;Patient/family education;Balance training      OT Goals(Current goals can be found in the care plan section)   Acute Rehab OT Goals Patient Stated Goal: To get better OT Goal Formulation: With patient Time For Goal Achievement: 03/11/24 Potential to Achieve Goals: Good ADL Goals Pt Will Perform Grooming: with modified independence;standing;sitting Pt Will Perform Lower Body Dressing: with modified independence;sit to/from stand Pt Will Transfer to Toilet: with modified independence;ambulating;regular height toilet Pt Will Perform Toileting - Clothing Manipulation and hygiene: with modified independence;sit to/from stand;sitting/lateral leans Pt Will Perform Tub/Shower Transfer: with modified independence;shower seat Additional ADL Goal #1: Pt will verbalize at least 3 energy conservation strategies to assist with ADLs   OT  Frequency:  Min 2X/week       AM-PAC OT 6 Clicks Daily Activity     Outcome Measure Help from another person eating meals?: None Help from another person taking care of personal grooming?: A Little Help from another person toileting, which includes using toliet, bedpan, or urinal?: A Little Help from another person bathing (including washing, rinsing, drying)?: A Little Help from another person to put on and taking off regular upper body clothing?: A Little Help from another person to put on and taking off regular lower body clothing?: A Little 6 Click Score: 19   End of Session Equipment Utilized During Treatment: Rolling walker (2 wheels) Nurse Communication: Mobility status  Activity Tolerance: Patient tolerated treatment well Patient left: in bed;with call bell/phone within reach;with nursing/sitter in room  OT Visit Diagnosis: Muscle weakness (generalized) (M62.81)                Time: 8371-8350 OT Time Calculation (min): 21 min Charges:  OT General Charges $OT Visit: 1 Visit OT Evaluation $OT Eval Moderate Complexity: 1 Mod  Kaeo Jacome C, OT  Acute Rehabilitation Services Office [phone] Secure chat preferred   Adrianne GORMAN Savers 02/26/2024, 5:07 PM

## 2024-02-27 DIAGNOSIS — J849 Interstitial pulmonary disease, unspecified: Secondary | ICD-10-CM | POA: Diagnosis not present

## 2024-02-27 DIAGNOSIS — J84112 Idiopathic pulmonary fibrosis: Secondary | ICD-10-CM

## 2024-02-27 DIAGNOSIS — J90 Pleural effusion, not elsewhere classified: Secondary | ICD-10-CM | POA: Diagnosis not present

## 2024-02-27 DIAGNOSIS — J9621 Acute and chronic respiratory failure with hypoxia: Secondary | ICD-10-CM | POA: Diagnosis not present

## 2024-02-27 DIAGNOSIS — R5381 Other malaise: Secondary | ICD-10-CM

## 2024-02-27 DIAGNOSIS — J9601 Acute respiratory failure with hypoxia: Secondary | ICD-10-CM | POA: Diagnosis not present

## 2024-02-27 DIAGNOSIS — I2699 Other pulmonary embolism without acute cor pulmonale: Secondary | ICD-10-CM | POA: Diagnosis not present

## 2024-02-27 DIAGNOSIS — J189 Pneumonia, unspecified organism: Secondary | ICD-10-CM | POA: Diagnosis not present

## 2024-02-27 LAB — BASIC METABOLIC PANEL WITH GFR
Anion gap: 12 (ref 5–15)
BUN: 35 mg/dL — ABNORMAL HIGH (ref 8–23)
CO2: 22 mmol/L (ref 22–32)
Calcium: 8.6 mg/dL — ABNORMAL LOW (ref 8.9–10.3)
Chloride: 103 mmol/L (ref 98–111)
Creatinine, Ser: 1.46 mg/dL — ABNORMAL HIGH (ref 0.44–1.00)
GFR, Estimated: 37 mL/min — ABNORMAL LOW (ref >60)
Glucose, Bld: 212 mg/dL — ABNORMAL HIGH (ref 70–99)
Potassium: 4.2 mmol/L (ref 3.5–5.1)
Sodium: 137 mmol/L (ref 135–145)

## 2024-02-27 LAB — CBC
HCT: 26.8 % — ABNORMAL LOW (ref 36.0–46.0)
Hemoglobin: 8.8 g/dL — ABNORMAL LOW (ref 12.0–15.0)
MCH: 30.2 pg (ref 26.0–34.0)
MCHC: 32.8 g/dL (ref 30.0–36.0)
MCV: 92.1 fL (ref 80.0–100.0)
Platelets: 465 K/uL — ABNORMAL HIGH (ref 150–400)
RBC: 2.91 MIL/uL — ABNORMAL LOW (ref 3.87–5.11)
RDW: 13.8 % (ref 11.5–15.5)
WBC: 18.6 K/uL — ABNORMAL HIGH (ref 4.0–10.5)
nRBC: 0 % (ref 0.0–0.2)

## 2024-02-27 LAB — MRSA NEXT GEN BY PCR, NASAL: MRSA by PCR Next Gen: NOT DETECTED

## 2024-02-27 LAB — LEGIONELLA PNEUMOPHILA SEROGP 1 UR AG: L. pneumophila Serogp 1 Ur Ag: NEGATIVE

## 2024-02-27 LAB — CULTURE, BLOOD (ROUTINE X 2)
Culture: NO GROWTH
Culture: NO GROWTH
Special Requests: ADEQUATE

## 2024-02-27 LAB — GLUCOSE, CAPILLARY
Glucose-Capillary: 164 mg/dL — ABNORMAL HIGH (ref 70–99)
Glucose-Capillary: 203 mg/dL — ABNORMAL HIGH (ref 70–99)
Glucose-Capillary: 306 mg/dL — ABNORMAL HIGH (ref 70–99)
Glucose-Capillary: 178 mg/dL — ABNORMAL HIGH (ref 70–99)

## 2024-02-27 LAB — MAGNESIUM: Magnesium: 2.8 mg/dL — ABNORMAL HIGH (ref 1.7–2.4)

## 2024-02-27 MED ORDER — METHYLPREDNISOLONE SODIUM SUCC 125 MG IJ SOLR
80.0000 mg | Freq: Every day | INTRAMUSCULAR | Status: DC
  Administered 2024-02-27 – 2024-02-28 (×2): 80 mg via INTRAVENOUS
  Filled 2024-02-27 (×2): qty 2

## 2024-02-27 NOTE — Progress Notes (Signed)
 Patient satting at 83-85% on HFNC 15L. Patient is at rest on bed, denies any shortness of breath and not in respiratory distress. Notified Dr Charlton and ordered for heated High flow. Respiratory therapist at the bedside putting patient on heated high flow.  Murl Zogg, RN

## 2024-02-27 NOTE — Progress Notes (Signed)
 Progress Note   Patient: Kathryn Scott FMW:[id number] DOB: [birth year] DOA: 02/25/2024     2 DOS: the patient was seen and examined on 02/27/2024   Brief hospital course: Aisling Emigh is a 78 y.o. female with past medical history  of pulmonary embolism, idiopathic pulmonary fibrosis, community-acquired pneumonia, acute kidney injury, essential hypertension, diabetes mellitus type 2, acquired hypothyroidism presented for evaluation of respiratory distress.  She was recently admitted on the 17th and discharged on the 21st found to have a pulmonary embolism.  Patient was admitted for respiratory failure, interstitial pulmonary fibrosis, presumed pneumonia  Assessment and Plan: Acute on chronic hypoxic respiratory failure Recurrent pneumonia Continue IV cefepime . Stop vancomycin  as MRSA negative. Procal low.  Urine strep pneumo ag negative. RVP negative. Continue IV Solu-Medrol  80 mg dialy Decreased IV Lasix  40mg  bid to daily given renal dysfunction. Continue Mucinex .  She was recently discharged on 3L with rest, 4L with activity. She is requiring high flow O2 at 10-15L. Mild exertion drops he saturation. Will continue to wean her O2 gradually as tolerated. PT/ OT, incentive spirometry as tolerated.  Interstitial pulmonary fibrosis: Pulmonologist follow-up appreciated, symptoms likely due to flareup of pulmonary fibrosis. Continue Cefepime , steroids. DuoNebs, supplemental oxygen, Breo Ellipta . Will follow pulmonary recommendations.  Pulmonary embolism: Recent diagnosis of PE, continue Eliquis  therapy.  Pulmonary hypertension: Due to PE.  Pulmonologist recommended repeat echo once stable. Continue IV Lasix  40 every 12. Ofev  on hold for now.  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 Creatinine worsened to 1.59. decreased Lasix  dose. Continue to monitor daily renal function. Avoid nephrotoxic  drugs. Metabolic acidosis improved with bicarb supplements.  Type 2 diabetes mellitus: Continue Accu-Cheks, sliding scale insulin .  Hypomagnesemia- Mag high today, got supplements yesterday. Recheck Mag level.  Hypothyroidism: Continue home dose Synthroid .      Out of bed to chair. Incentive spirometry. Nursing supportive care. Fall, aspiration precautions. Diet:  Diet Orders (From admission, onward)     Start     Ordered   02/25/24 1855  Diet Carb Modified Fluid consistency: Thin; Room service appropriate? Yes  Diet effective now       Question Answer Comment  Diet-HS Snack? Nothing   Calorie Level Medium 1600-2000   Fluid consistency: Thin   Room service appropriate? Yes      02/25/24 1854           DVT prophylaxis:  apixaban  (ELIQUIS ) tablet 5 mg  Level of care: Progressive   Code Status: Full Code  Subjective: Patient is seen and examined today morning. She is hypoxic even getting out of bed and O2 bumped to 12-15L for saturation to come up to 89. Worked with PT to sit in chair. Energy poor. Low appetite. Husband at bedside.  Physical Exam: Vitals:   02/27/24 0912 02/27/24 1136 02/27/24 1500 02/27/24 1551  BP:  (!) 128/58  120/66  Pulse:  65 79 71  Resp: 18 18  18   Temp:  98.2 F (36.8 C)  97.6 F (36.4 C)  TempSrc:  Oral  Oral  SpO2: 90% 90% (!) 86% 95%  Weight:      Height:        General - Elderly Caucasian female, mild respiratory distress HEENT - PERRLA, EOMI, atraumatic head, non tender sinuses. Lung - Clear, diffuse rales, rhonchi, no wheezes. Heart - S1, S2 heard, no murmurs, rubs, trace pedal edema. Abdomen - Soft, non tender, bowel sounds good Neuro - Alert, awake and  oriented x 3, non focal exam. Skin - Warm and dry.  Data Reviewed:      Latest Ref Rng & Units 02/27/2024    4:59 AM 02/26/2024    3:59 AM 02/25/2024    2:05 PM  CBC  WBC 4.0 - 10.5 K/uL 18.6  16.9    Hemoglobin 12.0 - 15.0 g/dL 8.8  9.2  88.7   Hematocrit 36.0 -  46.0 % 26.8  29.2  33.0   Platelets 150 - 400 K/uL 465  454        Latest Ref Rng & Units 02/27/2024    4:59 AM 02/26/2024    3:59 AM 02/25/2024    2:05 PM  BMP  Glucose 70 - 99 mg/dL 787  808    BUN 8 - 23 mg/dL 35  21    Creatinine 9.55 - 1.00 mg/dL 8.53  8.40    Sodium 864 - 145 mmol/L 137  139  140   Potassium 3.5 - 5.1 mmol/L 4.2  4.5  4.8   Chloride 98 - 111 mmol/L 103  107    CO2 22 - 32 mmol/L 22  17    Calcium  8.9 - 10.3 mg/dL 8.6  8.4     No results found.   Family Communication: Discussed with patient, husband at bedside. They understand and agree. All questions answered.  Disposition: Status is: Inpatient Remains inpatient appropriate because: hypoxic, iv steroids, iv abx, lasix , debility  Planned Discharge Destination: Home with Home Health     Time spent: 52 minutes  Author: Concepcion Riser, MD 02/27/2024 5:22 PM Secure chat 7am to 7pm For on call review [email or web address].

## 2024-02-27 NOTE — TOC Initial Note (Signed)
 Transition of Care [facility]) - Initial/Assessment Note    Patient Details  Name: Kathryn Scott MRN: [id number] Date of Birth: [birth year]  [facility]) CM/SW Contact:    Sudie Erminio Deems, RN Phone Number: 02/27/2024, 4:10 PM  Clinical Narrative: Risk for readmission assessment completed. Patient presented for shortness of breath-acute respiratory distress. PTA patient was from home with spouse. Patient was previously active with [facility]- for PT added RN for services. Liaison [facility] aware that the patient is hospitalized. Patient is currently active with [facility] for oxygen 3 liters. Patient continues on 15 liters oxygen HF. Case Manager will continue to follow for additional transition of care needs as the patient progresses.                     Expected Discharge Plan: Home w Home Health Services Barriers to Discharge: Continued Medical Work up   Patient Goals and CMS Choice Patient states their goals for this hospitalization and ongoing recovery are:: wants to return home and have oxygen levels maintained.   Choice offered to / list presented to : NA (Resuming services with [facility])      Expected Discharge Plan and Services   Discharge Planning Services: CM Consult Post Acute Care Choice: Home Health, Resumption of Svcs/PTA Provider Living arrangements for the past 2 months: Single Family Home                           HH Arranged: RN, Disease Management, PT HH Agency:  ([facility]) Date [facility] Agency Contacted: 02/27/24 Time HH Agency Contacted: 1510 Representative spoke with at [facility] Agency: [facility]  Prior Living Arrangements/Services Living arrangements for the past 2 months: Single Family Home   Patient language and need for interpreter reviewed:: Yes Do you feel safe going back to the place where you live?: Yes      Need for Family Participation in Patient Care: Yes (Comment)   Current home services: DME (oxygen with  [facility]) Criminal Activity/Legal Involvement Pertinent to Current Situation/Hospitalization: No - Comment as needed  Activities of Daily Living   ADL Screening (condition at time of admission) Independently performs ADLs?: Yes (appropriate for developmental age) Is the patient deaf or have difficulty hearing?: Yes Does the patient have difficulty seeing, even when wearing glasses/contacts?: No Does the patient have difficulty concentrating, remembering, or making decisions?: No  Permission Sought/Granted Permission sought to share information with : Family Supports, Case Manager Permission granted to share information with : Yes, Verbal Permission Granted     Permission granted to share info w AGENCY: [facility]        Emotional Assessment Appearance:: Appears stated age Attitude/Demeanor/Rapport: Engaged Affect (typically observed): Appropriate Orientation: : Oriented to Situation, Oriented to  Time, Oriented to Self Alcohol / Substance Use: Not Applicable Psych Involvement: No (comment)  Admission diagnosis:  Respiratory distress [R06.03] Pneumonia due to infectious organism, unspecified laterality, unspecified part of lung [J18.9] Sepsis, due to unspecified organism, unspecified whether acute organ dysfunction present [facility]) [A41.9] Patient Active Problem List   Diagnosis Date Noted   Respiratory distress 02/25/2024   CAP (community acquired pneumonia) 02/20/2024   Leukocytosis 02/20/2024   AKI (acute kidney injury) (HCC) 02/20/2024   Normocytic anemia 02/20/2024   Controlled type 2 diabetes mellitus without complication, without long-term current use of insulin  (HCC) 02/20/2024   Pulmonary embolism (HCC) 02/19/2024   Mixed hyperlipidemia 11/20/2022   Precordial pain 11/20/2022  Anxiety and depression 07/24/2021   Acquired hypothyroidism 07/24/2021   History of gastroesophageal reflux (GERD) 07/24/2021   History of vitamin D deficiency 07/24/2021   History of  diabetes mellitus, type II 07/24/2021   Essential hypertension 07/24/2021   History of asthma 07/24/2021   Hypertriglyceridemia 06/01/2021   Coronary artery disease involving native coronary artery of native heart without angina pectoris 05/31/2021   IPF (idiopathic pulmonary fibrosis) (HCC) 05/30/2020   PCP:  Carvin [name], MD Pharmacy:   [id number] - [place], [place] - [address] [place] [address] Phone: [phone] Fax: [phone]  [facility] - [place], [place] - [address] [place] [address] Phone: [phone] Fax: [phone]  [facility] - [place], [place] - [address] [place] [address] Phone: [phone] Fax: [phone]  [facility] [address] [place] [address] Phone: [phone] Fax: [phone]     Social Drivers of Health (SDOH) Social History: SDOH Screenings   Food Insecurity: No Food Insecurity (02/25/2024)  Housing: Low Risk  (02/25/2024)  Transportation Needs: No Transportation Needs (02/25/2024)  Utilities: Not At Risk (02/25/2024)  Financial Resource Strain: Medium Risk (09/13/2023)   Received from [facility]  Physical Activity: Unknown (09/13/2023)   Received from [facility]  Recent Concern: Physical Activity - Inactive (09/13/2023)   Received from [facility]  Social Connections: Moderately Integrated (02/25/2024)  Stress: Stress Concern Present (09/13/2023)   Received from [facility]  Tobacco Use: Medium Risk (02/26/2024)   SDOH Interventions:     Readmission Risk Interventions    02/27/2024    4:09 PM  Readmission Risk Prevention Plan  HRI or Home Care Consult Complete  Social Work Consult for Recovery Care Planning/Counseling Complete  Palliative Care Screening Not Applicable  Medication Review Ocean[employer]) Referral to Pharmacy

## 2024-02-27 NOTE — Progress Notes (Addendum)
 NAME:  Kathryn Scott, MRN:  [id number], DOB:  11-Apr-1946, LOS: 2 ADMISSION DATE:  02/25/2024, CONSULTATION DATE: 02/25/2024 REFERRING MD: CHARLENA Blanch, MD, CHIEF COMPLAINT: Acute hypoxic respiratory failure  History of Present Illness:   The patient, with idiopathic pulmonary fibrosis, presents with worsening respiratory symptoms following a recent hospitalization for pneumonia and pulmonary embolism. She is accompanied by her daughter, Rojelio.  Dyspnea and hypoxemia - Worsening shortness of breath following recent hospitalization for pneumonia and pulmonary embolism - Oxygen saturation drops to 70-71% during activities such as talking or going to the restroom, even when oxygen concentrator increased to five liters - Currently requiring 18 liters of oxygen - Discharged from hospital on Monday with instructions for oxygen therapy at three liters at rest and four liters during activity  Cough and hemoptysis - Cough with bloody mucus developed this morning  Fever - Fever developed this morning to 101  Recent hospitalization and cardiac findings - Admitted last Wednesday morning and discharged on Monday - Diagnosed with pneumonia, small pulmonary embolism, and suspected atrial fibrillation during hospitalization - Echocardiogram performed during last hospital visit showed moderate pulmonary HTN - Previous cardiac ultrasound in January of 2025 did not show any evidence of pulmonary hypertension  Idiopathic pulmonary fibrosis management - Receiving treatment with Ofev , taken consistently at home  Anticoagulation therapy - On Eliquis  for small pulmonary embolism, started at last admission  Peripheral edema - No swelling in feet  Pertinent  Medical History    has a past medical history of Asthma, Diabetes mellitus without complication (HCC), and Hypertension.   Significant Hospital Events: Including procedures, antibiotic start and stop dates in addition to other pertinent  events   7/23 pccm consult for Aoc hypoxia ?ILD flare   Interim History / Subjective:   Sitting up in chair at bedside, eating lunch States that breathing is improved.  Objective    Blood pressure (!) 128/58, pulse 79, temperature 98.2 F (36.8 C), temperature source Oral, resp. rate 18, height 5' 6 (1.676 m), weight 69.6 kg, SpO2 (!) 86%.        Intake/Output Summary (Last 24 hours) at 02/27/2024 1534 Last data filed at 02/27/2024 1144 Gross per 24 hour  Intake 1049.62 ml  Output 1300 ml  Net -250.38 ml   Filed Weights   02/25/24 1330 02/26/24 0503 02/27/24 0500  Weight: 68.9 kg 68.7 kg 69.6 kg    Examination: Gen:      No acute distress HEENT:  EOMI, sclera anicteric Neck:     No masses; no thyromegaly Lungs:    Clear to auscultation bilaterally; normal respiratory effort CV:         Regular rate and rhythm; no murmurs Abd:      + bowel sounds; soft, non-tender; no palpable masses, no distension Ext:    No edema; adequate peripheral perfusion Neuro: alert and oriented x 3 Psych: normal mood and affect   CTA 02/19/2024-segmental pulmonary embolism in the right upper lobe, new airspace consolidation, groundglass opacities in the right upper lobe, small right pleural effusion  CTA 02/24/2021-negative for pulmonary embolism.  Worsening diffuse bilateral heterogeneous groundglass opacities, mild adenopathy.  Echocardiogram 02/20/2024-LVEF 65-70%.  RV systolic function is normal, moderate elevation in PA systolic pressure  Resolved problem list   Assessment and Plan   AoC hypoxic resp failure  CAP IPF - less favored to be a flare  Segmental PE RUL  Small R pleural effusion  pHTN P PCT is low.  DC Vancomycin  as MRSA PCR  is negative Continue cefepime .  Will use short course of 5 days Continue steroids Lasix  dose reduced due to elevated creatinine.  Continue to diurese as tolerated Wean down oxygen Pulmonary hygiene, incentive spirometer, PT consult Eliquis  for  anticoagulation  Best Practice (right click and Reselect all SmartList Selections daily)   Per primary team  Signature:   Layni Kreamer MD [facility] See Amion for pager  If no response to pager , please call [phone] until 7pm After 7:00 pm call Elink  [phone] 02/27/2024, 3:41 PM

## 2024-02-27 NOTE — Progress Notes (Signed)
 Mobility Specialist Progress Note;   02/27/24 1035  Mobility  Activity Transferred from bed to chair  Level of Assistance Standby assist, set-up cues, supervision of patient - no hands on  Assistive Device Front wheel walker  Distance Ambulated (ft) 5 ft  Activity Response Tolerated fair  Mobility Referral Yes  Mobility visit 1 Mobility  Mobility Specialist Start Time (ACUTE ONLY) 1035  Mobility Specialist Stop Time (ACUTE ONLY) 1058  Mobility Specialist Time Calculation (min) (ACUTE ONLY) 23 min    Pre-mobility: SPO2 95% 10LO2 During-mobility: SPO2 85% 10LO2; SPO2 89%> 13LO2 Post-mobility: SPO2 98% 10LO2  Pt agreeable to mobility. Required no physical assistance during transfer. Required up to 13LO2 during mobility to maintain 89%>. Takes SPO2 ~34min to recover w/ movement. MD present during part of session. Pt left comfortably in chair with all needs met, call bell in reach. Alarm on. Left on 10LO2.   Lauraine Erm Mobility Specialist Please contact via SecureChat or [employer] [phone]

## 2024-02-27 NOTE — Plan of Care (Signed)
°  Problem: Clinical Measurements: Goal: Cardiovascular complication will be avoided Outcome: Progressing   Problem: Coping: Goal: Level of anxiety will decrease Outcome: Progressing   Problem: Elimination: Goal: Will not experience complications related to urinary retention Outcome: Progressing   Problem: Safety: Goal: Ability to remain free from injury will improve Outcome: Progressing

## 2024-02-27 NOTE — Plan of Care (Signed)
  Problem: Fluid Volume: Goal: Ability to maintain a balanced intake and output will improve Outcome: Progressing   Problem: Health Behavior/Discharge Planning: Goal: Ability to manage health-related needs will improve Outcome: Progressing   Problem: Metabolic: Goal: Ability to maintain appropriate glucose levels will improve Outcome: Progressing   Problem: Nutritional: Goal: Maintenance of adequate nutrition will improve Outcome: Progressing   Problem: Skin Integrity: Goal: Risk for impaired skin integrity will decrease Outcome: Progressing   Problem: Clinical Measurements: Goal: Will remain free from infection Outcome: Progressing Goal: Diagnostic test results will improve Outcome: Progressing   Problem: Coping: Goal: Level of anxiety will decrease Outcome: Progressing

## 2024-02-28 ENCOUNTER — Inpatient Hospital Stay (HOSPITAL_COMMUNITY)

## 2024-02-28 DIAGNOSIS — J9621 Acute and chronic respiratory failure with hypoxia: Secondary | ICD-10-CM | POA: Diagnosis not present

## 2024-02-28 DIAGNOSIS — J90 Pleural effusion, not elsewhere classified: Secondary | ICD-10-CM | POA: Diagnosis not present

## 2024-02-28 DIAGNOSIS — J849 Interstitial pulmonary disease, unspecified: Secondary | ICD-10-CM | POA: Diagnosis not present

## 2024-02-28 DIAGNOSIS — R0603 Acute respiratory distress: Secondary | ICD-10-CM | POA: Diagnosis not present

## 2024-02-28 LAB — BASIC METABOLIC PANEL WITH GFR
Anion gap: 13 (ref 5–15)
BUN: 40 mg/dL — ABNORMAL HIGH (ref 8–23)
CO2: 19 mmol/L — ABNORMAL LOW (ref 22–32)
Calcium: 8.6 mg/dL — ABNORMAL LOW (ref 8.9–10.3)
Chloride: 105 mmol/L (ref 98–111)
Creatinine, Ser: 1.3 mg/dL — ABNORMAL HIGH (ref 0.44–1.00)
GFR, Estimated: 43 mL/min — ABNORMAL LOW (ref >60)
Glucose, Bld: 117 mg/dL — ABNORMAL HIGH (ref 70–99)
Potassium: 4.4 mmol/L (ref 3.5–5.1)
Sodium: 137 mmol/L (ref 135–145)

## 2024-02-28 LAB — MAGNESIUM: Magnesium: 2.1 mg/dL (ref 1.7–2.4)

## 2024-02-28 LAB — CBC
HCT: 28 % — ABNORMAL LOW (ref 36.0–46.0)
Hemoglobin: 8.9 g/dL — ABNORMAL LOW (ref 12.0–15.0)
MCH: 29.2 pg (ref 26.0–34.0)
MCHC: 31.8 g/dL (ref 30.0–36.0)
MCV: 91.8 fL (ref 80.0–100.0)
Platelets: 571 K/uL — ABNORMAL HIGH (ref 150–400)
RBC: 3.05 MIL/uL — ABNORMAL LOW (ref 3.87–5.11)
RDW: 13.8 % (ref 11.5–15.5)
WBC: 17.8 K/uL — ABNORMAL HIGH (ref 4.0–10.5)
nRBC: 0 % (ref 0.0–0.2)

## 2024-02-28 LAB — APTT: aPTT: 55 s — ABNORMAL HIGH (ref 24–36)

## 2024-02-28 LAB — GLUCOSE, CAPILLARY
Glucose-Capillary: 169 mg/dL — ABNORMAL HIGH (ref 70–99)
Glucose-Capillary: 233 mg/dL — ABNORMAL HIGH (ref 70–99)
Glucose-Capillary: 220 mg/dL — ABNORMAL HIGH (ref 70–99)
Glucose-Capillary: 190 mg/dL — ABNORMAL HIGH (ref 70–99)

## 2024-02-28 LAB — MRSA CULTURE: Culture: NOT DETECTED

## 2024-02-28 LAB — HEPARIN LEVEL (UNFRACTIONATED): Heparin Unfractionated: >1.1 [IU]/mL — ABNORMAL HIGH (ref 0.30–0.70)

## 2024-02-28 MED ORDER — FUROSEMIDE 10 MG/ML IJ SOLN
40.0000 mg | Freq: Once | INTRAMUSCULAR | Status: AC
  Administered 2024-02-28: 40 mg via INTRAVENOUS
  Filled 2024-02-28: qty 4

## 2024-02-28 MED ORDER — HEPARIN (PORCINE) 25000 UT/250ML-% IV SOLN
1250.0000 [IU]/h | INTRAVENOUS | Status: DC
  Administered 2024-02-28: 850 [IU]/h via INTRAVENOUS
  Administered 2024-02-29: 1200 [IU]/h via INTRAVENOUS
  Administered 2024-03-01 – 2024-03-02 (×2): 1400 [IU]/h via INTRAVENOUS
  Filled 2024-02-28 (×4): qty 250

## 2024-02-28 MED ORDER — FUROSEMIDE 10 MG/ML IJ SOLN
40.0000 mg | Freq: Every day | INTRAMUSCULAR | Status: DC
  Administered 2024-02-28 – 2024-03-06 (×8): 40 mg via INTRAVENOUS
  Filled 2024-02-28 (×10): qty 4

## 2024-02-28 MED ORDER — SODIUM CHLORIDE 0.9 % IV SOLN
500.0000 mg | Freq: Two times a day (BID) | INTRAVENOUS | Status: AC
  Administered 2024-02-28 – 2024-03-02 (×6): 500 mg via INTRAVENOUS
  Filled 2024-02-28 (×7): qty 500

## 2024-02-28 MED ORDER — INSULIN ASPART 100 UNIT/ML IJ SOLN
0.0000 [IU] | INTRAMUSCULAR | Status: DC
  Administered 2024-02-28 (×2): 5 [IU] via SUBCUTANEOUS
  Administered 2024-02-28: 3 [IU] via SUBCUTANEOUS
  Administered 2024-02-29: 11 [IU] via SUBCUTANEOUS
  Administered 2024-02-29: 3 [IU] via SUBCUTANEOUS
  Administered 2024-02-29: 11 [IU] via SUBCUTANEOUS
  Administered 2024-02-29: 3 [IU] via SUBCUTANEOUS
  Administered 2024-02-29 – 2024-03-01 (×3): 5 [IU] via SUBCUTANEOUS
  Administered 2024-03-01: 8 [IU] via SUBCUTANEOUS
  Administered 2024-03-01 – 2024-03-02 (×4): 5 [IU] via SUBCUTANEOUS
  Administered 2024-03-02: 8 [IU] via SUBCUTANEOUS
  Administered 2024-03-02: 5 [IU] via SUBCUTANEOUS
  Administered 2024-03-02: 8 [IU] via SUBCUTANEOUS
  Administered 2024-03-02 – 2024-03-03 (×3): 3 [IU] via SUBCUTANEOUS
  Administered 2024-03-03: 2 [IU] via SUBCUTANEOUS
  Administered 2024-03-03 (×2): 3 [IU] via SUBCUTANEOUS
  Administered 2024-03-04: 2 [IU] via SUBCUTANEOUS
  Administered 2024-03-04: 3 [IU] via SUBCUTANEOUS
  Administered 2024-03-04: 2 [IU] via SUBCUTANEOUS
  Administered 2024-03-04: 5 [IU] via SUBCUTANEOUS
  Administered 2024-03-04 – 2024-03-05 (×2): 2 [IU] via SUBCUTANEOUS
  Administered 2024-03-05: 3 [IU] via SUBCUTANEOUS
  Administered 2024-03-05: 5 [IU] via SUBCUTANEOUS
  Administered 2024-03-05: 11 [IU] via SUBCUTANEOUS
  Administered 2024-03-05 (×2): 3 [IU] via SUBCUTANEOUS
  Administered 2024-03-06: 5 [IU] via SUBCUTANEOUS
  Administered 2024-03-06 (×2): 2 [IU] via SUBCUTANEOUS
  Administered 2024-03-06: 5 [IU] via SUBCUTANEOUS
  Administered 2024-03-06: 3 [IU] via SUBCUTANEOUS
  Administered 2024-03-06: 2 [IU] via SUBCUTANEOUS
  Administered 2024-03-07: 3 [IU] via SUBCUTANEOUS

## 2024-02-28 MED ORDER — HEPARIN BOLUS VIA INFUSION
4000.0000 [IU] | Freq: Once | INTRAVENOUS | Status: DC
  Filled 2024-02-28: qty 4000

## 2024-02-28 MED ORDER — MELATONIN 3 MG PO TABS
3.0000 mg | ORAL_TABLET | Freq: Every evening | ORAL | Status: DC | PRN
  Administered 2024-02-28: 3 mg via ORAL
  Filled 2024-02-28: qty 1

## 2024-02-28 MED ORDER — LORAZEPAM 2 MG/ML IJ SOLN
0.5000 mg | Freq: Once | INTRAMUSCULAR | Status: AC
  Administered 2024-02-28: 0.5 mg via INTRAVENOUS
  Filled 2024-02-28: qty 1

## 2024-02-28 NOTE — Progress Notes (Signed)
 Patient desaturating into the 60s on HHFNC/NRB. Placed patient back on BiPAP and saturations now in the 90s.

## 2024-02-28 NOTE — Progress Notes (Addendum)
 OT Cancellation Note  Patient Details Name: Kathryn Scott MRN: [id number] DOB: October 21, 1945   Cancelled Treatment:    Reason Eval/Treat Not Completed: Medical issues which prohibited therapy. Decreased respiratory status, on BiPap. OT will continue to follow acutely  Attempt #2 of the day at 4:45pm Pt now on HHFNC and NRB at this time. OT will hold for today. Likely to re-attempt Monday 7/28  Leita PARAS [facility] 02/28/2024, 9:48 AM  Leita DEL OTR/L Acute Rehabilitation Services Office: [phone]

## 2024-02-28 NOTE — Progress Notes (Addendum)
 Progress Note    Kathryn Scott  FMW:[id number] DOB: Dec 10, 1945  DOA: 02/25/2024 PCP: Carvin [name], MD      Brief Narrative:    Medical records reviewed and are as summarized below:  Kathryn Scott is a 78 y.o. female with past medical history of pulmonary embolism, idiopathic pulmonary fibrosis, community-acquired pneumonia, acute kidney injury, essential hypertension, diabetes mellitus type 2, acquired hypothyroidism presented for evaluation of respiratory distress. She was recently admitted on the 17th and discharged on the 21st found to have a pulmonary embolism. Patient was admitted for respiratory failure, interstitial pulmonary fibrosis, presumed pneumonia      Assessment/Plan:   Principal Problem:   Respiratory distress Active Problems:   Pulmonary embolism (HCC)   IPF (idiopathic pulmonary fibrosis) (HCC)    Body mass index is 24.52 kg/m.   Acute on chronic hypoxic respiratory failure Recurrent pneumonia She was requiring oxygen via heated humidified high flow nasal cannula FiO2 100% at 45 L/min.  However, she could not tolerate this and was still in respiratory distress so she was transitioned to BiPAP. Continue IV cefepime  and IV Solu-Medrol .   Continue IV Lasix . MRSA negative. Procal low.  Urine strep pneumo ag negative. RVP negative. Continue IV Solu-Medrol  80 mg dialy  She was recently discharged on 3L with rest, 4L with activity. She is requiring high flow O2 at 10-15L. Mild exertion drops he saturation.    Interstitial pulmonary fibrosis: Pulmonologist follow-up appreciated, symptoms likely due to flareup of pulmonary fibrosis. Continue IV cefepime  and IV Solu-Medrol  DuoNebs, supplemental oxygen, Breo Ellipta . Follow-up with pulmonologist    Pulmonary embolism: Recent diagnosis of PE Patient unable to take oral medications at this time.  Change Eliquis  to IV heparin  drip and monitor heparin  level per  protocol.    Pulmonary hypertension: Due to PE.  Pulmonologist recommended repeat echo once stable. Continue IV Lasix .  Ofev  on hold for now.    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 Creatinine improving, down from 1.59-1.46-1.30. Monitor BMP Avoid nephrotoxic drugs. Metabolic acidosis is a little better.    Type 2 diabetes mellitus: Sliding scale as needed    Hypomagnesemia- Improved.  High magnesium  level (likely from overcorrection) down to normal    Hypothyroidism: Continue home dose Synthroid .     Anxiety Patient was given Ativan  0.5 mg x 1 dose to help with anxiety and increased work of breathing.   Plan discussed with Samer, RN and respiratory therapist, at the bedside.   CRITICAL CARE Performed by: AIDA CHO   Total critical care time: 37 minutes  Critical care time was exclusive of separately billable procedures and treating other patients.  Critical care was necessary to treat or prevent imminent or life-threatening deterioration.  Critical care was time spent personally by me on the following activities: development of treatment plan with patient and/or surrogate as well as nursing, discussions with consultants, evaluation of patient's response to treatment, examination of patient, obtaining history from patient or surrogate, ordering and performing treatments and interventions, ordering and review of laboratory studies, ordering and review of radiographic studies, pulse oximetry and re-evaluation of patient's condition.    Diet Order             Diet Carb Modified Fluid consistency: Thin; Room service appropriate? Yes  Diet effective now  Consultants: Pulmonologist  Procedures: None    Medications:    apixaban   5 mg Oral BID   carvedilol   6.25 mg Oral BID   fluticasone  furoate-vilanterol  1 puff Inhalation Daily   furosemide   40 mg  Intravenous Daily   guaiFENesin   600 mg Oral BID   insulin  aspart  0-9 Units Subcutaneous TID WC   levothyroxine   75 mcg Oral Q0600   methylPREDNISolone  (SOLU-MEDROL ) injection  80 mg Intravenous Daily   montelukast   10 mg Oral QHS   pantoprazole  (PROTONIX ) IV  40 mg Intravenous Q12H   rosuvastatin   40 mg Oral Daily   sertraline   100 mg Oral QHS   sodium bicarbonate   650 mg Oral BID   sodium chloride  flush  3 mL Intravenous Q12H   Continuous Infusions:  ceFEPime  (MAXIPIME ) IV 2 g (02/28/24 0153)     Anti-infectives (From admission, onward)    Start     Dose/Rate Route Frequency Ordered Stop   02/26/24 1500  vancomycin  (VANCOREADY) IVPB 750 mg/150 mL  Status:  Discontinued        750 mg 150 mL/hr over 60 Minutes Intravenous Every 24 hours 02/25/24 2027 02/27/24 1720   02/26/24 0200  ceFEPIme  (MAXIPIME ) 2 g in sodium chloride  0.9 % 100 mL IVPB        2 g 200 mL/hr over 30 Minutes Intravenous Every 12 hours 02/25/24 1618     02/25/24 1500  vancomycin  (VANCOREADY) IVPB 1250 mg/250 mL        1,250 mg 166.7 mL/hr over 90 Minutes Intravenous  Once 02/25/24 1455 02/25/24 1721   02/25/24 1400  ceFEPIme  (MAXIPIME ) 2 g in sodium chloride  0.9 % 100 mL IVPB  Status:  Discontinued        2 g 200 mL/hr over 30 Minutes Intravenous Every 8 hours 02/25/24 1359 02/25/24 1618              Family Communication/Anticipated D/C date and plan/Code Status   DVT prophylaxis:  apixaban  (ELIQUIS ) tablet 5 mg     Code Status: Full Code  Family Communication: None.  I called her husband at 11:53 AM but there is no response.  I called her daughter at 11:54 AM but there was no response. Disposition Plan: To be determined   Status is: Inpatient Remains inpatient appropriate because: Acute hypoxic respiratory failure, pneumonia       Subjective:   Events noted.  She complains of shortness of breath and fatigue  Objective:    Vitals:   02/28/24 0726 02/28/24 0826 02/28/24 0858  02/28/24 0929  BP: 106/77     Pulse: 77  67 68  Resp: (!) 24  (!) 39   Temp: 99.7 F (37.6 C)     TempSrc: Axillary     SpO2: 96% 90% 97% 95%  Weight:      Height:       No data found.   Intake/Output Summary (Last 24 hours) at 02/28/2024 0945 Last data filed at 02/28/2024 0411 Gross per 24 hour  Intake 543 ml  Output 1550 ml  Net -1007 ml   Filed Weights   02/26/24 0503 02/27/24 0500 02/28/24 0402  Weight: 68.7 kg 69.6 kg 68.9 kg    Exam:  GEN: Mild respiratory distress, on BiPAP SKIN: Warm and dry EYES: No pallor or icterus ENT: MMM CV: RRR PULM: Bibasilar rales. ABD: soft, ND, NT, +BS CNS: AAO x 3, non focal EXT: No edema or tenderness  Data Reviewed:   I have personally reviewed following labs and imaging studies:  Labs: Labs show the following:   Basic Metabolic Panel: Recent Labs  Lab 02/23/24 0531 02/25/24 1343 02/25/24 1405 02/26/24 0359 02/26/24 1159 02/27/24 0459 02/28/24 0208  NA 139 137 140 139  --  137 137  K 4.9 4.8 4.8 4.5  --  4.2 4.4  CL 107 106  --  107  --  103 105  CO2 22 20*  --  17*  --  22 19*  GLUCOSE 136* 167*  --  191*  --  212* 117*  BUN 20 14  --  21  --  35* 40*  CREATININE 1.27* 1.33*  --  1.59*  --  1.46* 1.30*  CALCIUM  8.6* 8.8*  --  8.4*  --  8.6* 8.6*  MG  --  1.6*  --   --  1.8 2.8* 2.1   GFR Estimated Creatinine Clearance: 35 mL/min (A) (by C-G formula based on SCr of 1.3 mg/dL (H)). Liver Function Tests: Recent Labs  Lab 02/25/24 1343 02/26/24 0359  AST 29 27  ALT 18 18  ALKPHOS 46 46  BILITOT 0.5 1.1  PROT 6.8 6.5  ALBUMIN  2.4* 2.2*   No results for input(s): LIPASE, AMYLASE in the last 168 hours. No results for input(s): AMMONIA in the last 168 hours. Coagulation profile Recent Labs  Lab 02/25/24 1343  INR 3.3*    CBC: Recent Labs  Lab 02/23/24 0531 02/25/24 1343 02/25/24 1405 02/26/24 0359 02/27/24 0459 02/28/24 0208  WBC 9.6 15.3*  --  16.9* 18.6* 17.8*  NEUTROABS  6.7 13.0*  --   --   --   --   HGB 9.3* 10.3* 11.2* 9.2* 8.8* 8.9*  HCT 29.1* 32.9* 33.0* 29.2* 26.8* 28.0*  MCV 93.9 95.4  --  95.1 92.1 91.8  PLT 290 484*  --  454* 465* 571*   Cardiac Enzymes: No results for input(s): CKTOTAL, CKMB, CKMBINDEX, TROPONINI in the last 168 hours. BNP (last 3 results) No results for input(s): PROBNP in the last 8760 hours. CBG: Recent Labs  Lab 02/27/24 0729 02/27/24 1141 02/27/24 1659 02/27/24 2118 02/28/24 0730  GLUCAP 164* 203* 306* 178* 169*   D-Dimer: No results for input(s): DDIMER in the last 72 hours. Hgb A1c: Recent Labs    02/26/24 0359  HGBA1C 5.6   Lipid Profile: No results for input(s): CHOL, HDL, LDLCALC, TRIG, CHOLHDL, LDLDIRECT in the last 72 hours. Thyroid function studies: No results for input(s): TSH, T4TOTAL, T3FREE, THYROIDAB in the last 72 hours.  Invalid input(s): FREET3 Anemia work up: No results for input(s): VITAMINB12, FOLATE, FERRITIN, TIBC, IRON, RETICCTPCT in the last 72 hours. Sepsis Labs: Recent Labs  Lab 02/25/24 1343 02/25/24 1406 02/26/24 0359 02/26/24 1159 02/27/24 0459 02/28/24 0208  PROCALCITON 0.16  --   --  0.44  --   --   WBC 15.3*  --  16.9*  --  18.6* 17.8*  LATICACIDVEN  --  1.4  --   --   --   --     Microbiology Recent Results (from the past 240 hours)  Respiratory (~20 pathogens) panel by PCR     Status: None   Collection Time: 02/21/24  7:38 AM   Specimen: Nasopharyngeal Swab; Respiratory  Result Value Ref Range Status   Adenovirus NOT DETECTED NOT DETECTED Final   Coronavirus 229E NOT DETECTED NOT DETECTED Final    Comment: (NOTE) The Coronavirus on the Respiratory Panel, DOES  NOT test for the novel  Coronavirus (2019 nCoV)    Coronavirus HKU1 NOT DETECTED NOT DETECTED Final   Coronavirus NL63 NOT DETECTED NOT DETECTED Final   Coronavirus OC43 NOT DETECTED NOT DETECTED Final   Metapneumovirus NOT DETECTED NOT DETECTED Final    Rhinovirus / Enterovirus NOT DETECTED NOT DETECTED Final   Influenza A NOT DETECTED NOT DETECTED Final   Influenza B NOT DETECTED NOT DETECTED Final   Parainfluenza Virus 1 NOT DETECTED NOT DETECTED Final   Parainfluenza Virus 2 NOT DETECTED NOT DETECTED Final   Parainfluenza Virus 3 NOT DETECTED NOT DETECTED Final   Parainfluenza Virus 4 NOT DETECTED NOT DETECTED Final   Respiratory Syncytial Virus NOT DETECTED NOT DETECTED Final   Bordetella pertussis NOT DETECTED NOT DETECTED Final   Bordetella Parapertussis NOT DETECTED NOT DETECTED Final   Chlamydophila pneumoniae NOT DETECTED NOT DETECTED Final   Mycoplasma pneumoniae NOT DETECTED NOT DETECTED Final    Comment: Performed at [facility], [address] [place], [place] [address]  Culture, blood (Routine X 2) w Reflex to ID Panel     Status: None   Collection Time: 02/22/24  2:48 PM   Specimen: BLOOD LEFT ARM  Result Value Ref Range Status   Specimen Description BLOOD LEFT ARM  Final   Special Requests   Final    BOTTLES DRAWN AEROBIC ONLY Blood Culture adequate volume   Culture   Final    NO GROWTH 5 DAYS Performed at [facility], [address], [place], [place] [address]    Report Status 02/27/2024 FINAL  Final  Culture, blood (Routine X 2) w Reflex to ID Panel     Status: None   Collection Time: 02/22/24  2:48 PM   Specimen: BLOOD LEFT HAND  Result Value Ref Range Status   Specimen Description BLOOD LEFT HAND  Final   Special Requests   Final    BOTTLES DRAWN AEROBIC ONLY Blood Culture results may not be optimal due to an inadequate volume of blood received in culture bottles   Culture   Final    NO GROWTH 5 DAYS Performed at [facility], [address], [place], [place] [address]    Report Status 02/27/2024 FINAL  Final  Resp panel by RT-PCR (RSV, Flu A&B, Covid) Anterior Nasal Swab     Status: None   Collection Time: 02/25/24  1:43 PM   Specimen: Anterior Nasal Swab  Result Value Ref Range  Status   SARS Coronavirus 2 by RT PCR NEGATIVE NEGATIVE Final   Influenza A by PCR NEGATIVE NEGATIVE Final   Influenza B by PCR NEGATIVE NEGATIVE Final    Comment: (NOTE) The Xpert Xpress SARS-CoV-2/FLU/RSV plus assay is intended as an aid in the diagnosis of influenza from Nasopharyngeal swab specimens and should not be used as a sole basis for treatment. Nasal washings and aspirates are unacceptable for Xpert Xpress SARS-CoV-2/FLU/RSV testing.  Fact Sheet for Patients: BloggerCourse.com  Fact Sheet for Healthcare Providers: SeriousBroker.it  This test is not yet approved or cleared by the [place]  FDA and has been authorized for detection and/or diagnosis of SARS-CoV-2 by FDA under an Emergency Use Authorization (EUA). This EUA will remain in effect (meaning this test can be used) for the duration of the COVID-19 declaration under Section 564(b)(1) of the Act, 21 U.S.C. section 360bbb-3(b)(1), unless the authorization is terminated or revoked.     Resp Syncytial Virus by PCR NEGATIVE NEGATIVE Final  Comment: (NOTE) Fact Sheet for Patients: BloggerCourse.com  Fact Sheet for Healthcare Providers: SeriousBroker.it  This test is not yet approved or cleared by the [place]  FDA and has been authorized for detection and/or diagnosis of SARS-CoV-2 by FDA under an Emergency Use Authorization (EUA). This EUA will remain in effect (meaning this test can be used) for the duration of the COVID-19 declaration under Section 564(b)(1) of the Act, 21 U.S.C. section 360bbb-3(b)(1), unless the authorization is terminated or revoked.  Performed at [facility], [address], [place], [place] [address]   Blood Culture (routine x 2)     Status: None (Preliminary result)   Collection Time: 02/25/24  1:43 PM   Specimen: BLOOD  Result Value Ref Range Status   Specimen  Description BLOOD SITE NOT SPECIFIED  Final   Special Requests   Final    BOTTLES DRAWN AEROBIC AND ANAEROBIC Blood Culture adequate volume   Culture   Final    NO GROWTH 2 DAYS Performed at [facility], [address] [place], [place] [address]    Report Status PENDING  Incomplete  Blood Culture (routine x 2)     Status: None (Preliminary result)   Collection Time: 02/25/24  3:07 PM   Specimen: BLOOD LEFT HAND  Result Value Ref Range Status   Specimen Description BLOOD LEFT HAND  Final   Special Requests   Final    BOTTLES DRAWN AEROBIC ONLY Blood Culture results may not be optimal due to an inadequate volume of blood received in culture bottles   Culture   Final    NO GROWTH 2 DAYS Performed at [facility], [address], [place], [place] [address]    Report Status PENDING  Incomplete  Respiratory (~20 pathogens) panel by PCR     Status: None   Collection Time: 02/25/24  6:40 PM   Specimen: Respiratory  Result Value Ref Range Status   Adenovirus NOT DETECTED NOT DETECTED Final   Coronavirus 229E NOT DETECTED NOT DETECTED Final    Comment: (NOTE) The Coronavirus on the Respiratory Panel, DOES NOT test for the novel  Coronavirus (2019 nCoV)    Coronavirus HKU1 NOT DETECTED NOT DETECTED Final   Coronavirus NL63 NOT DETECTED NOT DETECTED Final   Coronavirus OC43 NOT DETECTED NOT DETECTED Final   Metapneumovirus NOT DETECTED NOT DETECTED Final   Rhinovirus / Enterovirus NOT DETECTED NOT DETECTED Final   Influenza A NOT DETECTED NOT DETECTED Final   Influenza B NOT DETECTED NOT DETECTED Final   Parainfluenza Virus 1 NOT DETECTED NOT DETECTED Final   Parainfluenza Virus 2 NOT DETECTED NOT DETECTED Final   Parainfluenza Virus 3 NOT DETECTED NOT DETECTED Final   Parainfluenza Virus 4 NOT DETECTED NOT DETECTED Final   Respiratory Syncytial Virus NOT DETECTED NOT DETECTED Final   Bordetella pertussis NOT DETECTED NOT DETECTED Final   Bordetella Parapertussis NOT  DETECTED NOT DETECTED Final   Chlamydophila pneumoniae NOT DETECTED NOT DETECTED Final   Mycoplasma pneumoniae NOT DETECTED NOT DETECTED Final    Comment: Performed at [facility], [address] [place], [place] [address]  MRSA culture     Status: None (Preliminary result)   Collection Time: 02/25/24  8:28 PM   Specimen: Nasal; Body Fluid  Result Value Ref Range Status   Specimen Description NASAL SWAB  Final   Special Requests NONE  Final   Culture   Final    CULTURE REINCUBATED FOR BETTER GROWTH Performed  at [facility], [address] [place], [place] [address]    Report Status PENDING  Incomplete  MRSA Next Gen by PCR, Nasal     Status: None   Collection Time: 02/27/24  7:49 AM   Specimen: Nasal Mucosa; Nasal Swab  Result Value Ref Range Status   MRSA by PCR Next Gen NOT DETECTED NOT DETECTED Final    Comment: (NOTE) The GeneXpert MRSA Assay (FDA approved for NASAL specimens only), is one component of a comprehensive MRSA colonization surveillance program. It is not intended to diagnose MRSA infection nor to guide or monitor treatment for MRSA infections. Test performance is not FDA approved in patients less than 32 years old. Performed at [facility], [address] [place], [place] [address]     Procedures and diagnostic studies:  DG CHEST PORT 1 VIEW Result Date: 02/28/2024 CLINICAL DATA:  [id number]. Acute on chronic respiratory failure with hypoxia. EXAM: PORTABLE CHEST 1 VIEW COMPARISON:  Portable chest 02/25/2024, CTA chest 02/25/2024. FINDINGS: 3:34 a.m. There is background widespread interstitial coarsening consistent with subpleural fibrosis. Superimposed on this is worsening patchy dense bilateral airspace disease consistent with edema, pneumonia or combination. There are small pleural effusions which appear similar. The mediastinum is stable with aortic atherosclerosis. There is mild cardiomegaly. Central vessels are not well seen due to airspace  disease. No new osseous finding. IMPRESSION: 1. Worsening patchy dense bilateral airspace disease consistent with edema, pneumonia or combination. Background chronic interstitial lung disease. 2. Small pleural effusions. 3. Aortic atherosclerosis. 4. Mild cardiomegaly. Electronically Signed   By: Francis Quam M.D.   On: 02/28/2024 03:52               LOS: 3 days   Ervine Witucki  [facility]   Pager on [email or web address]. If 7PM-7AM, please contact night-coverage at [email or web address]     02/28/2024, 9:45 AM

## 2024-02-28 NOTE — Progress Notes (Signed)
 RT note. RT at bedside this morning. Patient found with NRB and HHFNC 45L-100% sat 92%. Took NRB when admin breo and patient dropped down to 75% with a good wave form. NRB placed back on and patient now sat 90%. Patient noted with labored breathing, will try bipap. RT will continue to monitor.    02/28/24 0826  Aerosol Therapy Tx  $ Hand Held Nebulizer  1  Medications Breo  Delivery Device DPI  Pre-Treatment Pulse 75  Pre-Treatment Respirations 18  Treatment Tolerance Tolerated well  Treatment Given 1  RT Breath Sounds  Bilateral Breath Sounds Clear;Diminished  Oxygen Therapy/Pulse Ox  O2 Device (S)  HHFNC;Non-rebreather Mask  O2 Therapy Oxygen humidified  Heater temperature 98.6 F (37 C)  O2 Flow Rate (L/min) 45 L/min  FiO2 (%) 100 %  SpO2 90 %

## 2024-02-28 NOTE — Progress Notes (Addendum)
 [facility] Overnight Coverage Note   Patient was seen for increasing oxygen requirement.   Brief HPI: She is a 78 yr old with IPF, pulmonary HTN, and recent admission for pneumonia and small PE who was discharged with supplemental O2 and Eliquis , returned on 7/23 with hypoxia and bloody sputum, and was admitted.   She had CTA on 7/23 that was negative for PE but notable for increased b/l ground glass airspace disease and pleural effusions.   She has been receiving antibiotics, systemic steroids, and Lasix .   Overnight, her supplemental O2 requirement has been increasing.   S: She does not feel particularly SOB and denies chest pain but feels generally poor. She tells me that she would not want to be intubated but that she would want to receive ACLS medications, chest compressions, and shocks if indicated. She is open to ongoing code status discussion.   O: BP 129/58, HR 70s, RR mid-20s, sat low 90s on heated high-flow nasal canula delivering 30 Lpm + NRB.   She is alert and fully oriented, mildly tachypneic, and using accessory muscles. There are rales bilaterally.   A&P: Check CXR, continue heated high flow with NRB, give AM Lasix  dose now.

## 2024-02-28 NOTE — IPAL (Addendum)
  Interdisciplinary Goals of Care Family Meeting   Date carried out: 02/28/2024  Location of the meeting: Bedside  Member's involved: Physician, Bedside Registered Nurse, and Respiratory Therapist  Durable Power of Attorney or acting medical decision maker: Patient, Kathryn Scott    Discussion: We discussed diagnoses, prognosis and goals of care for [facility] .  She is on BiPAP and she was informed that if she fails BiPAP then she may need to be intubated and placed on mechanical ventilator for the respiratory support.  She had previously informed Dr. Charlton, nocturnist on-call that did not want any intubation but was okay with ACLS medications.  She wanted to know whether she would be able to come off the ventilator.  I explained that it is difficult to determine whether she can come off the ventilator or not.  She was also informed that normally people with healthy lungs who have pneumonia and respiratory failure have a better chance of coming off the ventilator compared to those with unhealthy lungs or underlying chronic lung disease. At this time, she is unable to make any additional decisions.  She said she prefers to discuss this further with her family.  Code status:   Code Status: Full Code   Disposition: Continue current acute care  Time spent for the meeting: 10 minutes    AIDA CHO, MD  02/28/2024, 12:07 PM  ADDENDUM  I met with her husband at the bedside around 1:05 PM.  Diagnoses, prognosis and goals of care were discussed.  He said patient does not want any intubation and mechanical ventilation.  However, she is okay with ACLS medications.  He understands that ACLS medications without airway protection, intubation and mechanical ventilation may not be adequate for cardiopulmonary resuscitation.

## 2024-02-28 NOTE — Plan of Care (Signed)
  Problem: Clinical Measurements: Goal: Cardiovascular complication will be avoided Outcome: Progressing   Problem: Elimination: Goal: Will not experience complications related to urinary retention Outcome: Progressing   Problem: Safety: Goal: Ability to remain free from injury will improve Outcome: Progressing

## 2024-02-28 NOTE — IPAL (Signed)
  Interdisciplinary Goals of Care Family Meeting   Date carried out:: 02/28/2024  Location of the meeting: Phone conference  Member's involved: Physician and Family Member or next of kin  Durable Power of Attorney or acting medical decision maker: Daughter who is the health care POA    Discussion: We discussed goals of care for [employer] .   Discussed goals of care for [facility] and concern for worsening respiratory status due to exacerbation of IPF.  Poor prognosis for recovery.  We decided that her CODE STATUS is DNR with no CPR or intubation.  Continue supportive care for now.  If she deteriorates further then consider comfort measures.    Code status: Full DNR  Disposition: Continue current acute care   Time spent for the meeting: 15 mins  Lonna Coder MD  Pulmonary & Critical care See Amion for pager  If no response to pager , please call [phone] until 7pm After 7:00 pm call Elink  [phone] 02/28/2024, 3:30 PM

## 2024-02-28 NOTE — Progress Notes (Signed)
 RT note. Patient placed on HHFNC & NRB at this time while family is in room. Bipap at bedside, RN aware. Patient sat 97%.   02/28/24 1637  Therapy Vitals  Pulse Rate 70  Resp (!) 33  MEWS Score/Color  MEWS Score 2  MEWS Score Color Yellow  Respiratory Assessment  Assessment Type Assess only  Respiratory Pattern Regular;Unlabored;Tachypnea  Chest Assessment Chest expansion symmetrical  Bilateral Breath Sounds Clear;Diminished  Oxygen Therapy/Pulse Ox  O2 Device (S)  HHFNC;Non-rebreather Mask  O2 Therapy Oxygen humidified  Heater temperature 87.8 F (31 C) (warming)  O2 Flow Rate (L/min) 55 L/min  FiO2 (%) 100 %  SpO2 97 %

## 2024-02-28 NOTE — Progress Notes (Addendum)
 ANTICOAGULATION CONSULT NOTE  Pharmacy Consult for heparin  Indication: pulmonary embolus  Allergies  Allergen Reactions   Lisinopril Swelling and Other (See Comments)    Swelling/systemic reaction.   Penicillins Hives   Sulfa Antibiotics Hives and Itching    Red/itching   Clindamycin/Lincomycin Hives    Patient Measurements: Height: 5' 6 (167.6 cm) Weight: 68.9 kg (151 lb 14.4 oz) IBW/kg (Calculated) : 59.3 HEPARIN  DW (KG): 68.9  Vital Signs: Temp: 99.7 F (37.6 C) (07/26 0726) Temp Source: Axillary (07/26 0726) BP: 106/77 (07/26 0726) Pulse Rate: 68 (07/26 0929)  Labs: Recent Labs    02/25/24 1343 02/25/24 1405 02/26/24 0359 02/27/24 0459 02/28/24 0208  HGB 10.3*   < > 9.2* 8.8* 8.9*  HCT 32.9*   < > 29.2* 26.8* 28.0*  PLT 484*  --  454* 465* 571*  APTT 53*  --   --   --   --   LABPROT 35.2*  --   --   --   --   INR 3.3*  --   --   --   --   CREATININE 1.33*  --  1.59* 1.46* 1.30*   < > = values in this interval not displayed.    Estimated Creatinine Clearance: 35 mL/min (A) (by C-G formula based on SCr of 1.3 mg/dL (H)).  Medical History: Past Medical History:  Diagnosis Date   Asthma    Diabetes mellitus without complication (HCC)    Hypertension     Medications:  See MAR  Assessment: 78 yo F presents with PE (02/19/2024). PTA Elqiuis (LD 7/25 2022). Pt unable to take in PO and being transitioned to IV. Pharmacy consulted for heparin  dosing. CBC stable - Hgb 8.9, Plt 571.   No bolus given therapeutic anticoagulation with DOAC.  Goal of Therapy:  Heparin  level 0.3-0.7 units/ml aPTT 66-102 seconds Monitor platelets by anticoagulation protocol: Yes   Plan:  Start Heparin  infusion 850 units/hr 8 hour anti-Xa and heparin  level Daily CBC, anti-Xa level Monitor for s/sx of bleeding  Elma Fail, PharmD PGY1 Clinical Pharmacist [facility]  02/28/2024 9:39 AM

## 2024-02-28 NOTE — Plan of Care (Signed)
  Problem: Education: Goal: Ability to describe self-care measures that may prevent or decrease complications (Diabetes Survival Skills Education) will improve Outcome: Progressing   Problem: Skin Integrity: Goal: Risk for impaired skin integrity will decrease Outcome: Progressing   Problem: Activity: Goal: Risk for activity intolerance will decrease Outcome: Progressing   Problem: Nutrition: Goal: Adequate nutrition will be maintained Outcome: Progressing   

## 2024-02-28 NOTE — Progress Notes (Signed)
 RT note. Patient placed on bipap at this time with the following settings, RT will continue to monitor.    02/28/24 0858  BiPAP/CPAP/SIPAP  $ Non-Invasive Ventilator  Non-Invasive Vent Set Up;Non-Invasive Vent Initial  $ Face Mask Medium Yes  BiPAP/CPAP/SIPAP Pt Type Adult  BiPAP/CPAP/SIPAP SERVO  Mask Type Full face mask  Mask Size Medium  Set Rate 8 breaths/min  Respiratory Rate 39 breaths/min  IPAP 18 cmH20  EPAP 6 cmH2O  PEEP 6 cmH20  FiO2 (%) 100 %  Minute Ventilation 19.7  Leak 38  Peak Inspiratory Pressure (PIP) 24  Tidal Volume (Vt) 504  Patient Home Machine No  Patient Home Mask No  Patient Home Tubing No  Auto Titrate No  Press High Alarm 25 cmH2O  BiPAP/CPAP /SiPAP Vitals  Pulse Rate 67  Resp (!) 39  SpO2 97 %  Bilateral Breath Sounds Clear;Diminished  MEWS Score/Color  MEWS Score 3  MEWS Score Color Yellow

## 2024-02-28 NOTE — Progress Notes (Signed)
 NAME:  Kathryn Scott, MRN:  [id number], DOB:  [birth year], LOS: 3 ADMISSION DATE:  02/25/2024, CONSULTATION DATE: 02/25/2024 REFERRING MD: CHARLENA Blanch, MD, CHIEF COMPLAINT: Acute hypoxic respiratory failure  History of Present Illness:   The patient, with idiopathic pulmonary fibrosis, presents with worsening respiratory symptoms following a recent hospitalization for pneumonia and pulmonary embolism. She is accompanied by her daughter, Rojelio.  Dyspnea and hypoxemia - Worsening shortness of breath following recent hospitalization for pneumonia and pulmonary embolism - Oxygen saturation drops to 70-71% during activities such as talking or going to the restroom, even when oxygen concentrator increased to five liters - Currently requiring 18 liters of oxygen - Discharged from hospital on Monday with instructions for oxygen therapy at three liters at rest and four liters during activity  Cough and hemoptysis - Cough with bloody mucus developed this morning  Fever - Fever developed this morning to 101  Recent hospitalization and cardiac findings - Admitted last Wednesday morning and discharged on Monday - Diagnosed with pneumonia, small pulmonary embolism, and suspected atrial fibrillation during hospitalization - Echocardiogram performed during last hospital visit showed moderate pulmonary HTN - Previous cardiac ultrasound in January of 2025 did not show any evidence of pulmonary hypertension  Idiopathic pulmonary fibrosis management - Receiving treatment with Ofev , taken consistently at home  Anticoagulation therapy - On Eliquis  for small pulmonary embolism, started at last admission  Peripheral edema - No swelling in feet  Pertinent  Medical History    has a past medical history of Asthma, Diabetes mellitus without complication (HCC), and Hypertension.   Significant Hospital Events: Including procedures, antibiotic start and stop dates in addition to other pertinent  events   7/23 pccm consult for Aoc hypoxia ?ILD flare   Interim History / Subjective:   Worsening respiratory failure overnight requiring heated high flow nasal cannula and transition to BiPAP for desats Given Ativan  for anxiety Chest x-ray with worsening bilateral opacities  Objective    Blood pressure 106/77, pulse 72, temperature 98.4 F (36.9 C), temperature source Oral, resp. rate (!) 38, height 5' 6 (1.676 m), weight 68.9 kg, SpO2 91%.    FiO2 (%):  [80 %-100 %] 80 % PEEP:  [6 cmH20] 6 cmH20   Intake/Output Summary (Last 24 hours) at 02/28/2024 1211 Last data filed at 02/28/2024 0411 Gross per 24 hour  Intake 420 ml  Output 850 ml  Net -430 ml   Filed Weights   02/26/24 0503 02/27/24 0500 02/28/24 0402  Weight: 68.7 kg 69.6 kg 68.9 kg    Examination: Gen:      No acute distress HEENT:  EOMI, sclera anicteric Neck:     No masses; no thyromegaly Lungs:    Bilateral crackles CV:         Regular rate and rhythm; no murmurs Abd:      + bowel sounds; soft, non-tender; no palpable masses, no distension Ext:    No edema; adequate peripheral perfusion Neuro: Asleep on BiPAP  CTA 02/19/2024-segmental pulmonary embolism in the right upper lobe, new airspace consolidation, groundglass opacities in the right upper lobe, small right pleural effusion  CTA 02/24/2021-negative for pulmonary embolism.  Worsening diffuse bilateral heterogeneous groundglass opacities, mild adenopathy.  Echocardiogram 02/20/2024-LVEF 65-70%.  RV systolic function is normal, moderate elevation in PA systolic pressure  Resolved problem list   Assessment and Plan   AoC hypoxic resp failure  CAP IPF Segmental PE RUL  Small R pleural effusion  pHTN  P PCT is low.  Of  vancomycin  as MRSA PCR is negative Continue cefepime  for HAP treatment Continue steroids.  Increase dose to pulse steroids 1 g daily for 3 days as she is decompensating Continue Lasix  for diuresis.  Give additional 40 mg dose  now BiPAP.  Will try to wean off Eliquis  for anticoagulation  Unfortunately this is starting to look like a flare up of IPF with poor prognosis.  Will need to discuss goals of care with husband when he arrives to the hospital. Recommend palliative care consult  Best Practice (right click and Reselect all SmartList Selections daily)   Per primary team  Signature:   Modupe Shampine MD [place] [facility] See Amion for pager  If no response to pager , please call [phone] until 7pm After 7:00 pm call Elink  [phone] 02/28/2024, 12:11 PM

## 2024-02-28 NOTE — Progress Notes (Signed)
 ANTICOAGULATION CONSULT NOTE  Pharmacy Consult for heparin  Indication: pulmonary embolus  Allergies  Allergen Reactions   Lisinopril Swelling and Other (See Comments)    Swelling/systemic reaction.   Penicillins Hives   Sulfa Antibiotics Hives and Itching    Red/itching   Clindamycin/Lincomycin Hives    Patient Measurements: Height: 5' 6 (167.6 cm) Weight: 68.9 kg (151 lb 14.4 oz) IBW/kg (Calculated) : 59.3 HEPARIN  DW (KG): 68.9  Vital Signs: Temp: 97.4 F (36.3 C) (07/26 1549) Temp Source: Oral (07/26 1549) BP: 126/68 (07/26 1549) Pulse Rate: 70 (07/26 1637)  Labs: Recent Labs    02/26/24 0359 02/27/24 0459 02/28/24 0208 02/28/24 1844  HGB 9.2* 8.8* 8.9*  --   HCT 29.2* 26.8* 28.0*  --   PLT 454* 465* 571*  --   APTT  --   --   --  55*  HEPARINUNFRC  --   --   --  >1.10*  CREATININE 1.59* 1.46* 1.30*  --     Estimated Creatinine Clearance: 35 mL/min (A) (by C-G formula based on SCr of 1.3 mg/dL (H)).  Medical History: Past Medical History:  Diagnosis Date   Asthma    Diabetes mellitus without complication (HCC)    Hypertension     Medications:  See MAR  Assessment: 78 yo F presents with PE (02/19/2024). PTA [facility] (LD 7/25 2022). Pt unable to take in PO and being transitioned to IV. Pharmacy consulted for heparin  dosing. Initial dosing, no bolus given due to therapeutic anticoagulation with DOAC.  7/26 PM: Heparin  level >1.10, aPTT subtherapeutic at 55 sec on infusion rate of 850 units/hr. Due to PE on 7/17 and having been on DOAC prior to admission, will not bolus and just increase rate to target goal aPTT. Hgb and plts stable.  Goal of Therapy:  Heparin  level 0.3-0.7 units/ml aPTT 66-102 seconds Monitor platelets by anticoagulation protocol: Yes   Plan:  Increase Heparin  infusion to 1050 units/hr Heparin  level, aPTT with AM labs Daily CBC Monitor for s/sx of bleeding  Janell Mccune, PharmD PGY-1 Pharmacy Resident [facility]  02/28/2024 7:53 PM

## 2024-02-29 DIAGNOSIS — J849 Interstitial pulmonary disease, unspecified: Secondary | ICD-10-CM | POA: Diagnosis not present

## 2024-02-29 DIAGNOSIS — R0603 Acute respiratory distress: Secondary | ICD-10-CM | POA: Diagnosis not present

## 2024-02-29 DIAGNOSIS — J9621 Acute and chronic respiratory failure with hypoxia: Secondary | ICD-10-CM | POA: Diagnosis not present

## 2024-02-29 DIAGNOSIS — J90 Pleural effusion, not elsewhere classified: Secondary | ICD-10-CM | POA: Diagnosis not present

## 2024-02-29 LAB — BASIC METABOLIC PANEL WITH GFR
Anion gap: 16 — ABNORMAL HIGH (ref 5–15)
BUN: 46 mg/dL — ABNORMAL HIGH (ref 8–23)
CO2: 18 mmol/L — ABNORMAL LOW (ref 22–32)
Calcium: 8.9 mg/dL (ref 8.9–10.3)
Chloride: 106 mmol/L (ref 98–111)
Creatinine, Ser: 1.36 mg/dL — ABNORMAL HIGH (ref 0.44–1.00)
GFR, Estimated: 41 mL/min — ABNORMAL LOW (ref >60)
Glucose, Bld: 216 mg/dL — ABNORMAL HIGH (ref 70–99)
Potassium: 4.2 mmol/L (ref 3.5–5.1)
Sodium: 140 mmol/L (ref 135–145)

## 2024-02-29 LAB — GLUCOSE, CAPILLARY
Glucose-Capillary: 169 mg/dL — ABNORMAL HIGH (ref 70–99)
Glucose-Capillary: 177 mg/dL — ABNORMAL HIGH (ref 70–99)
Glucose-Capillary: 198 mg/dL — ABNORMAL HIGH (ref 70–99)
Glucose-Capillary: 216 mg/dL — ABNORMAL HIGH (ref 70–99)
Glucose-Capillary: 251 mg/dL — ABNORMAL HIGH (ref 70–99)
Glucose-Capillary: 322 mg/dL — ABNORMAL HIGH (ref 70–99)
Glucose-Capillary: 325 mg/dL — ABNORMAL HIGH (ref 70–99)
Glucose-Capillary: 336 mg/dL — ABNORMAL HIGH (ref 70–99)

## 2024-02-29 LAB — CBC
HCT: 27.7 % — ABNORMAL LOW (ref 36.0–46.0)
Hemoglobin: 9 g/dL — ABNORMAL LOW (ref 12.0–15.0)
MCH: 29.6 pg (ref 26.0–34.0)
MCHC: 32.5 g/dL (ref 30.0–36.0)
MCV: 91.1 fL (ref 80.0–100.0)
Platelets: 440 K/uL — ABNORMAL HIGH (ref 150–400)
RBC: 3.04 MIL/uL — ABNORMAL LOW (ref 3.87–5.11)
RDW: 13.8 % (ref 11.5–15.5)
WBC: 15.6 K/uL — ABNORMAL HIGH (ref 4.0–10.5)
nRBC: 0 % (ref 0.0–0.2)

## 2024-02-29 LAB — HEPARIN LEVEL (UNFRACTIONATED): Heparin Unfractionated: >1.1 [IU]/mL — ABNORMAL HIGH (ref 0.30–0.70)

## 2024-02-29 LAB — MAGNESIUM: Magnesium: 2 mg/dL (ref 1.7–2.4)

## 2024-02-29 LAB — APTT
aPTT: 52 s — ABNORMAL HIGH (ref 24–36)
aPTT: 61 s — ABNORMAL HIGH (ref 24–36)
aPTT: 77 s — ABNORMAL HIGH (ref 24–36)

## 2024-02-29 MED ORDER — ORAL CARE MOUTH RINSE
15.0000 mL | OROMUCOSAL | Status: DC
  Administered 2024-02-29 (×2): 15 mL via OROMUCOSAL

## 2024-02-29 MED ORDER — ORAL CARE MOUTH RINSE
15.0000 mL | OROMUCOSAL | Status: DC | PRN
Start: 2024-02-29 — End: 2024-03-07

## 2024-02-29 NOTE — Progress Notes (Signed)
 ANTICOAGULATION CONSULT NOTE Pharmacy Consult for heparin  Indication: pulmonary embolus Brief A/P: aPTT subtherapeutic  Increase Heparin  rate  Allergies  Allergen Reactions   Lisinopril Swelling and Other (See Comments)    Swelling/systemic reaction.   Penicillins Hives   Sulfa Antibiotics Hives and Itching    Red/itching   Clindamycin/Lincomycin Hives    Patient Measurements: Height: 5' 6 (167.6 cm) Weight: 68.5 kg (151 lb 0.2 oz) IBW/kg (Calculated) : 59.3 HEPARIN  DW (KG): 68.9  Vital Signs: Temp: 97.6 F (36.4 C) (07/27 0437) Temp Source: Axillary (07/27 0437) BP: 134/71 (07/27 0437) Pulse Rate: 63 (07/27 0437)  Labs: Recent Labs    02/27/24 0459 02/28/24 0208 02/28/24 1844 02/29/24 0505  HGB 8.8* 8.9*  --   --   HCT 26.8* 28.0*  --   --   PLT 465* 571*  --   --   APTT  --   --  55* 52*  HEPARINUNFRC  --   --  >1.10*  --   CREATININE 1.46* 1.30*  --   --     Estimated Creatinine Clearance: 35 mL/min (A) (by C-G formula based on SCr of 1.3 mg/dL (H)).  Assessment: 78 y.o. female with h/o PE, Eliquis  on hold, for heparin   Goal of Therapy:  Heparin  level 0.3-0.7 units/ml aPTT 66-102 seconds Monitor platelets by anticoagulation protocol: Yes   Plan:  Increase Heparin  1200 units/hr Check aPTT in 8 hours   Cathlyn Arrant, PharmD, BCPS  02/29/2024 6:23 AM

## 2024-02-29 NOTE — Plan of Care (Signed)
 ?  Problem: Coping: ?Goal: Level of anxiety will decrease ?Outcome: Progressing ?  ?Problem: Safety: ?Goal: Ability to remain free from injury will improve ?Outcome: Progressing ?  ?

## 2024-02-29 NOTE — Progress Notes (Addendum)
 ANTICOAGULATION CONSULT NOTE  Pharmacy Consult for heparin  Indication: pulmonary embolus  Allergies  Allergen Reactions   Lisinopril Swelling and Other (See Comments)    Swelling/systemic reaction.   Penicillins Hives   Sulfa Antibiotics Hives and Itching    Red/itching   Clindamycin/Lincomycin Hives    Patient Measurements: Height: 5' 6 (167.6 cm) Weight: 68.5 kg (151 lb 0.2 oz) IBW/kg (Calculated) : 59.3 HEPARIN  DW (KG): 68.9  Vital Signs: Temp: 98.3 F (36.8 C) (07/27 1547) Temp Source: Oral (07/27 1547) BP: 121/58 (07/27 1547) Pulse Rate: 72 (07/27 1547)  Labs: Recent Labs    02/27/24 0459 02/28/24 0208 02/28/24 1844 02/28/24 1844 02/29/24 0505 02/29/24 0804 02/29/24 1611 02/29/24 1713  HGB 8.8* 8.9*  --   --   --  9.0*  --   --   HCT 26.8* 28.0*  --   --   --  27.7*  --   --   PLT 465* 571*  --   --   --  440*  --   --   APTT  --   --  55*   < > 52*  --  61* 77*  HEPARINUNFRC  --   --  >1.10*  --   --  >1.10*  --   --   CREATININE 1.46* 1.30*  --   --  1.36*  --   --   --    < > = values in this interval not displayed.    Estimated Creatinine Clearance: 33.5 mL/min (A) (by C-G formula based on SCr of 1.36 mg/dL (H)).  Medical History: Past Medical History:  Diagnosis Date   Asthma    Diabetes mellitus without complication (HCC)    Hypertension     Medications:  See MAR  Assessment: 78 yo F presents with PE (02/19/2024). PTA El[facility]is (LD 7/25 2022). Pt unable to take in PO and being transitioned to IV. Pharmacy consulted for heparin  dosing. Initial dosing, no bolus given due to therapeutic anticoagulation with DOAC.  7/27 PM: Heparin  level >1.10, aPTT therapeutic at 77 sec on infusion rate of 1200 units/hr. Hgb and plts stable. Heparin  level high due to recent Eliquis  use, will monitor heparin  via aPTT.  Goal of Therapy:  Heparin  level 0.3-0.7 units/ml aPTT 66-102 seconds Monitor platelets by anticoagulation protocol: Yes   Plan:  Continue  Heparin  infusion at 1200 units/hr Heparin  level, aPTT with AM labs Daily CBC Monitor for s/sx of bleeding  Janell Mccune, PharmD PGY-1 Pharmacy Resident [facility]  02/29/2024 6:05 PM

## 2024-02-29 NOTE — Plan of Care (Signed)
  Problem: Fluid Volume: Goal: Ability to maintain a balanced intake and output will improve Outcome: Progressing   Problem: Health Behavior/Discharge Planning: Goal: Ability to identify and utilize available resources and services will improve Outcome: Progressing Goal: Ability to manage health-related needs will improve Outcome: Progressing   Problem: Metabolic: Goal: Ability to maintain appropriate glucose levels will improve Outcome: Progressing   Problem: Nutritional: Goal: Maintenance of adequate nutrition will improve Outcome: Progressing   Problem: Skin Integrity: Goal: Risk for impaired skin integrity will decrease Outcome: Progressing   Problem: Tissue Perfusion: Goal: Adequacy of tissue perfusion will improve Outcome: Progressing   Problem: Clinical Measurements: Goal: Respiratory complications will improve Outcome: Progressing   Problem: Activity: Goal: Risk for activity intolerance will decrease Outcome: Progressing   Problem: Nutrition: Goal: Adequate nutrition will be maintained Outcome: Progressing   Problem: Coping: Goal: Level of anxiety will decrease Outcome: Progressing

## 2024-02-29 NOTE — Progress Notes (Signed)
   02/28/24 1946  Assess: MEWS Score  Temp 98.6 F (37 C)  BP 128/65  MAP (mmHg) 84  Pulse Rate 69  ECG Heart Rate 69  Resp (!) 36  Level of Consciousness Alert  SpO2 97 %  O2 Device Bi-PAP  Patient Activity (if Appropriate) In bed  Assess: MEWS Score  MEWS Temp 0  MEWS Systolic 0  MEWS Pulse 0  MEWS RR 3  MEWS LOC 0  MEWS Score 3  MEWS Score Color Yellow  Assess: if the MEWS score is Yellow or Red  Were vital signs accurate and taken at a resting state? Yes  Does the patient meet 2 or more of the SIRS criteria? No  MEWS guidelines implemented  Yes, yellow  Treat  MEWS Interventions Considered administering scheduled or prn medications/treatments as ordered  Take Vital Signs  Increase Vital Sign Frequency  Yellow: Q2hr x1, continue Q4hrs until patient remains green for 12hrs  Escalate  MEWS: Escalate Yellow: Discuss with charge nurse and consider notifying provider and/or RRT  Notify: Charge Nurse/RN  Name of Charge Nurse/RN Notified Jill, RN  Assess: SIRS CRITERIA  SIRS Temperature  0  SIRS Respirations  1  SIRS Pulse 0  SIRS WBC 0  SIRS Score Sum  1

## 2024-02-29 NOTE — Progress Notes (Addendum)
 Progress Note    Kathryn Scott  FMW:[id number] DOB: 1946-01-22  DOA: 02/25/2024 PCP: Carvin [name], MD      Brief Narrative:    Medical records reviewed and are as summarized below:  Kathryn Scott is a 78 y.o. female with past medical history of pulmonary embolism, idiopathic pulmonary fibrosis, community-acquired pneumonia, acute kidney injury, essential hypertension, diabetes mellitus type 2, acquired hypothyroidism presented for evaluation of respiratory distress. She was recently admitted on the 17th and discharged on the 21st found to have a pulmonary embolism. Patient was admitted for respiratory failure, interstitial pulmonary fibrosis, presumed pneumonia      Assessment/Plan:   Principal Problem:   Respiratory distress Active Problems:   Pulmonary embolism (HCC)   IPF (idiopathic pulmonary fibrosis) (HCC)    Body mass index is 24.37 kg/m.   Acute on chronic hypoxic respiratory failure Recurrent pneumonia She is still requiring BiPAP when I saw her this morning.  Attempt to wean off BiPAP and transition to oxygenation via heated humidified high flow nasal cannula was initially unsuccessful.   Later in the day, she was able to transition to [facility] with FiO2 of 87% at 55 L/min.  Monitor closely and transition back to BiPAP if she develops respiratory distress. Continue IV cefepime , Solu-Medrol  and IV Lasix . MRSA negative. Procal low.  Urine strep pneumo ag negative. RVP negative. She was recently discharged on 3L with rest, 4L with activity.    Interstitial pulmonary fibrosis with acute exacerbation: Continue antibiotics and steroids. Continue bronchodilators Follow-up with pulmonologist.    Pulmonary embolism: Recent diagnosis of PE on 02/19/2024 Continue IV heparin  and monitor heparin  level per protocol.    Pulmonary hypertension: Due to PE.   Continue IV Lasix .  Ofev  on hold for now. 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 Creatinine is stable.  1.59-1.46-1.30-1.36. Monitor BMP Avoid nephrotoxic drugs.    Type 2 diabetes mellitus: NovoLog  as needed for hyperglycemia.    Hypomagnesemia- Improved.  High magnesium  level (likely from overcorrection) down to normal    Hypothyroidism: Continue home dose Synthroid .     Anxiety S/p treatment with IV Ativan  0.5 mg on 02/28/2024.   Palliative care has been consulted because of concern for poor prognosis.   CRITICAL CARE Performed by: AIDA CHO   Total critical care time: 37 minutes  Critical care time was exclusive of separately billable procedures and treating other patients.  Critical care was necessary to treat or prevent imminent or life-threatening deterioration.  Critical care was time spent personally by me on the following activities: development of treatment plan with patient and/or surrogate as well as nursing, discussions with consultants, evaluation of patient's response to treatment, examination of patient, obtaining history from patient or surrogate, ordering and performing treatments and interventions, ordering and review of laboratory studies, ordering and review of radiographic studies, pulse oximetry and re-evaluation of patient's condition. care  Diet Order             Diet Carb Modified Fluid consistency: Thin; Room service appropriate? Yes  Diet effective now                            Consultants: Pulmonologist  Procedures: None    Medications:    carvedilol   6.25 mg Oral BID   fluticasone  furoate-vilanterol  1 puff  Inhalation Daily   furosemide   40 mg Intravenous Daily   guaiFENesin   600 mg Oral BID   insulin  aspart  0-15 Units Subcutaneous Q4H   levothyroxine   75 mcg Oral Q0600   montelukast   10 mg Oral QHS   mouth rinse  15 mL Mouth Rinse 4  times per day   pantoprazole  (PROTONIX ) IV  40 mg Intravenous Q12H   rosuvastatin   40 mg Oral Daily   sertraline   100 mg Oral QHS   sodium bicarbonate   650 mg Oral BID   sodium chloride  flush  3 mL Intravenous Q12H   Continuous Infusions:  ceFEPime  (MAXIPIME ) IV 2 g (02/29/24 0151)   heparin  1,200 Units/hr (02/29/24 9367)   methylPREDNISolone  (SOLU-MEDROL ) injection 500 mg (02/29/24 1102)     Anti-infectives (From admission, onward)    Start     Dose/Rate Route Frequency Ordered Stop   02/26/24 1500  vancomycin  (VANCOREADY) IVPB 750 mg/150 mL  Status:  Discontinued        750 mg 150 mL/hr over 60 Minutes Intravenous Every 24 hours 02/25/24 2027 02/27/24 1720   02/26/24 0200  ceFEPIme  (MAXIPIME ) 2 g in sodium chloride  0.9 % 100 mL IVPB        2 g 200 mL/hr over 30 Minutes Intravenous Every 12 hours 02/25/24 1618     02/25/24 1500  vancomycin  (VANCOREADY) IVPB 1250 mg/250 mL        1,250 mg 166.7 mL/hr over 90 Minutes Intravenous  Once 02/25/24 1455 02/25/24 1721   02/25/24 1400  ceFEPIme  (MAXIPIME ) 2 g in sodium chloride  0.9 % 100 mL IVPB  Status:  Discontinued        2 g 200 mL/hr over 30 Minutes Intravenous Every 8 hours 02/25/24 1359 02/25/24 1618              Family Communication/Anticipated D/C date and plan/Code Status   DVT prophylaxis:      Code Status: Limited: Do not attempt resuscitation (DNR) -DNR-LIMITED -Do Not Intubate/DNI   Family Communication: None.  I called her husband at 11:53 AM but there is no response.  I called her daughter at 11:54 AM but there was no response. Disposition Plan: To be determined   Status is: Inpatient Remains inpatient appropriate because: Acute hypoxic respiratory failure, pneumonia       Subjective:   Interval events noted.  She feels better today.  No shortness of breath or chest pain.  Objective:    Vitals:   02/29/24 0437 02/29/24 0804 02/29/24 0825 02/29/24 1046  BP: 134/71 118/61  130/72  Pulse: 63   66 69  Resp: (!) 29 (!) 30 (!) 36 (!) 24  Temp: 97.6 F (36.4 C) (!) 97.5 F (36.4 C)  97.7 F (36.5 C)  TempSrc: Axillary Oral  Oral  SpO2: 97%  98% 100%  Weight:      Height:       No data found.   Intake/Output Summary (Last 24 hours) at 02/29/2024 1237 Last data filed at 02/29/2024 0954 Gross per 24 hour  Intake 730.9 ml  Output --  Net 730.9 ml   Filed Weights   02/27/24 0500 02/28/24 0402 02/29/24 0431  Weight: 69.6 kg 68.9 kg 68.5 kg    Exam:  GEN: NAD, she appears comfortable on BiPAP SKIN: Warm and dry EYES: No pallor or icterus ENT: MMM CV: RRR PULM: Bilateral rhonchi, bibasilar rales ABD: soft, ND, NT, +BS CNS: AAO x 3, non focal EXT: No edema or tenderness  Data Reviewed:   I have personally reviewed following labs and imaging studies:  Labs: Labs show the following:   Basic Metabolic Panel: Recent Labs  Lab 02/25/24 1343 02/25/24 1405 02/26/24 0359 02/26/24 1159 02/27/24 0459 02/28/24 0208 02/29/24 0505  NA 137 140 139  --  137 137 140  K 4.8 4.8 4.5  --  4.2 4.4 4.2  CL 106  --  107  --  103 105 106  CO2 20*  --  17*  --  22 19* 18*  GLUCOSE 167*  --  191*  --  212* 117* 216*  BUN 14  --  21  --  35* 40* 46*  CREATININE 1.33*  --  1.59*  --  1.46* 1.30* 1.36*  CALCIUM  8.8*  --  8.4*  --  8.6* 8.6* 8.9  MG 1.6*  --   --  1.8 2.8* 2.1 2.0   GFR Estimated Creatinine Clearance: 33.5 mL/min (A) (by C-G formula based on SCr of 1.36 mg/dL (H)). Liver Function Tests: Recent Labs  Lab 02/25/24 1343 02/26/24 0359  AST 29 27  ALT 18 18  ALKPHOS 46 46  BILITOT 0.5 1.1  PROT 6.8 6.5  ALBUMIN  2.4* 2.2*   No results for input(s): LIPASE, AMYLASE in the last 168 hours. No results for input(s): AMMONIA in the last 168 hours. Coagulation profile Recent Labs  Lab 02/25/24 1343  INR 3.3*    CBC: Recent Labs  Lab 02/23/24 0531 02/25/24 1343 02/25/24 1405 02/26/24 0359 02/27/24 0459 02/28/24 0208 02/29/24 0804  WBC  9.6 15.3*  --  16.9* 18.6* 17.8* 15.6*  NEUTROABS 6.7 13.0*  --   --   --   --   --   HGB 9.3* 10.3* 11.2* 9.2* 8.8* 8.9* 9.0*  HCT 29.1* 32.9* 33.0* 29.2* 26.8* 28.0* 27.7*  MCV 93.9 95.4  --  95.1 92.1 91.8 91.1  PLT 290 484*  --  454* 465* 571* 440*   Cardiac Enzymes: No results for input(s): CKTOTAL, CKMB, CKMBINDEX, TROPONINI in the last 168 hours. BNP (last 3 results) No results for input(s): PROBNP in the last 8760 hours. CBG: Recent Labs  Lab 02/28/24 1944 02/29/24 0020 02/29/24 0420 02/29/24 0802 02/29/24 1142  GLUCAP 190* 169* 216* 198* 177*   D-Dimer: No results for input(s): DDIMER in the last 72 hours. Hgb A1c: No results for input(s): HGBA1C in the last 72 hours.  Lipid Profile: No results for input(s): CHOL, HDL, LDLCALC, TRIG, CHOLHDL, LDLDIRECT in the last 72 hours. Thyroid function studies: No results for input(s): TSH, T4TOTAL, T3FREE, THYROIDAB in the last 72 hours.  Invalid input(s): FREET3 Anemia work up: No results for input(s): VITAMINB12, FOLATE, FERRITIN, TIBC, IRON, RETICCTPCT in the last 72 hours. Sepsis Labs: Recent Labs  Lab 02/25/24 1343 02/25/24 1406 02/26/24 0359 02/26/24 1159 02/27/24 0459 02/28/24 0208 02/29/24 0804  PROCALCITON 0.16  --   --  0.44  --   --   --   WBC 15.3*  --  16.9*  --  18.6* 17.8* 15.6*  LATICACIDVEN  --  1.4  --   --   --   --   --     Microbiology Recent Results (from the past 240 hours)  Respiratory (~20 pathogens) panel by PCR     Status: None   Collection Time: 02/21/24  7:38 AM   Specimen: Nasopharyngeal Swab; Respiratory  Result Value Ref Range Status   Adenovirus NOT DETECTED NOT DETECTED Final   Coronavirus 229E NOT  DETECTED NOT DETECTED Final    Comment: (NOTE) The Coronavirus on the Respiratory Panel, DOES NOT test for the novel  Coronavirus (2019 nCoV)    Coronavirus HKU1 NOT DETECTED NOT DETECTED Final   Coronavirus NL63 NOT DETECTED NOT  DETECTED Final   Coronavirus OC43 NOT DETECTED NOT DETECTED Final   Metapneumovirus NOT DETECTED NOT DETECTED Final   Rhinovirus / Enterovirus NOT DETECTED NOT DETECTED Final   Influenza A NOT DETECTED NOT DETECTED Final   Influenza B NOT DETECTED NOT DETECTED Final   Parainfluenza Virus 1 NOT DETECTED NOT DETECTED Final   Parainfluenza Virus 2 NOT DETECTED NOT DETECTED Final   Parainfluenza Virus 3 NOT DETECTED NOT DETECTED Final   Parainfluenza Virus 4 NOT DETECTED NOT DETECTED Final   Respiratory Syncytial Virus NOT DETECTED NOT DETECTED Final   Bordetella pertussis NOT DETECTED NOT DETECTED Final   Bordetella Parapertussis NOT DETECTED NOT DETECTED Final   Chlamydophila pneumoniae NOT DETECTED NOT DETECTED Final   Mycoplasma pneumoniae NOT DETECTED NOT DETECTED Final    Comment: Performed at [facility], [address] [place], [place] [address]  Culture, blood (Routine X 2) w Reflex to ID Panel     Status: None   Collection Time: 02/22/24  2:48 PM   Specimen: BLOOD LEFT ARM  Result Value Ref Range Status   Specimen Description BLOOD LEFT ARM  Final   Special Requests   Final    BOTTLES DRAWN AEROBIC ONLY Blood Culture adequate volume   Culture   Final    NO GROWTH 5 DAYS Performed at [facility], [address], [place], [place] [address]    Report Status 02/27/2024 FINAL  Final  Culture, blood (Routine X 2) w Reflex to ID Panel     Status: None   Collection Time: 02/22/24  2:48 PM   Specimen: BLOOD LEFT HAND  Result Value Ref Range Status   Specimen Description BLOOD LEFT HAND  Final   Special Requests   Final    BOTTLES DRAWN AEROBIC ONLY Blood Culture results may not be optimal due to an inadequate volume of blood received in culture bottles   Culture   Final    NO GROWTH 5 DAYS Performed at [facility], [address], [place], [place] [address]    Report Status 02/27/2024 FINAL  Final  Resp panel by RT-PCR (RSV, Flu A&B, Covid) Anterior Nasal  Swab     Status: None   Collection Time: 02/25/24  1:43 PM   Specimen: Anterior Nasal Swab  Result Value Ref Range Status   SARS Coronavirus 2 by RT PCR NEGATIVE NEGATIVE Final   Influenza A by PCR NEGATIVE NEGATIVE Final   Influenza B by PCR NEGATIVE NEGATIVE Final    Comment: (NOTE) The Xpert Xpress SARS-CoV-2/FLU/RSV plus assay is intended as an aid in the diagnosis of influenza from Nasopharyngeal swab specimens and should not be used as a sole basis for treatment. Nasal washings and aspirates are unacceptable for Xpert Xpress SARS-CoV-2/FLU/RSV testing.  Fact Sheet for Patients: BloggerCourse.com  Fact Sheet for Healthcare Providers: SeriousBroker.it  This test is not yet approved or cleared by the [place]  FDA and has been authorized for detection and/or diagnosis of SARS-CoV-2 by FDA under an Emergency Use Authorization (EUA). This EUA will remain in effect (meaning this test can be used) for the duration of the COVID-19 declaration under Section 564(b)(1) of the Act, 21 U.S.C. section 360bbb-3(b)(1), unless the authorization is terminated or  revoked.     Resp Syncytial Virus by PCR NEGATIVE NEGATIVE Final    Comment: (NOTE) Fact Sheet for Patients: BloggerCourse.com  Fact Sheet for Healthcare Providers: SeriousBroker.it  This test is not yet approved or cleared by the [place]  FDA and has been authorized for detection and/or diagnosis of SARS-CoV-2 by FDA under an Emergency Use Authorization (EUA). This EUA will remain in effect (meaning this test can be used) for the duration of the COVID-19 declaration under Section 564(b)(1) of the Act, 21 U.S.C. section 360bbb-3(b)(1), unless the authorization is terminated or revoked.  Performed at [facility], [address] [place], [place] [address]   Blood Culture (routine x 2)     Status: None  (Preliminary result)   Collection Time: 02/25/24  1:43 PM   Specimen: BLOOD  Result Value Ref Range Status   Specimen Description BLOOD SITE NOT SPECIFIED  Final   Special Requests   Final    BOTTLES DRAWN AEROBIC AND ANAEROBIC Blood Culture adequate volume   Culture   Final    NO GROWTH 4 DAYS Performed at [facility], [address], [place], [place] [address]    Report Status PENDING  Incomplete  Blood Culture (routine x 2)     Status: None (Preliminary result)   Collection Time: 02/25/24  3:07 PM   Specimen: BLOOD LEFT HAND  Result Value Ref Range Status   Specimen Description BLOOD LEFT HAND  Final   Special Requests   Final    BOTTLES DRAWN AEROBIC ONLY Blood Culture results may not be optimal due to an inadequate volume of blood received in culture bottles   Culture   Final    NO GROWTH 4 DAYS Performed at [facility], [address], [place], [place] [address]    Report Status PENDING  Incomplete  Respiratory (~20 pathogens) panel by PCR     Status: None   Collection Time: 02/25/24  6:40 PM   Specimen: Respiratory  Result Value Ref Range Status   Adenovirus NOT DETECTED NOT DETECTED Final   Coronavirus 229E NOT DETECTED NOT DETECTED Final    Comment: (NOTE) The Coronavirus on the Respiratory Panel, DOES NOT test for the novel  Coronavirus (2019 nCoV)    Coronavirus HKU1 NOT DETECTED NOT DETECTED Final   Coronavirus NL63 NOT DETECTED NOT DETECTED Final   Coronavirus OC43 NOT DETECTED NOT DETECTED Final   Metapneumovirus NOT DETECTED NOT DETECTED Final   Rhinovirus / Enterovirus NOT DETECTED NOT DETECTED Final   Influenza A NOT DETECTED NOT DETECTED Final   Influenza B NOT DETECTED NOT DETECTED Final   Parainfluenza Virus 1 NOT DETECTED NOT DETECTED Final   Parainfluenza Virus 2 NOT DETECTED NOT DETECTED Final   Parainfluenza Virus 3 NOT DETECTED NOT DETECTED Final   Parainfluenza Virus 4 NOT DETECTED NOT DETECTED Final   Respiratory Syncytial Virus  NOT DETECTED NOT DETECTED Final   Bordetella pertussis NOT DETECTED NOT DETECTED Final   Bordetella Parapertussis NOT DETECTED NOT DETECTED Final   Chlamydophila pneumoniae NOT DETECTED NOT DETECTED Final   Mycoplasma pneumoniae NOT DETECTED NOT DETECTED Final    Comment: Performed at [facility], [address] [place], [place] [address]  MRSA culture     Status: None   Collection Time: 02/25/24  8:28 PM   Specimen: Nasal; Body Fluid  Result Value Ref Range Status   Specimen Description NASAL SWAB  Final   Special Requests NONE  Final  Culture   Final    NO MRSA DETECTED Performed at [facility], [address] [place], [place] [address]    Report Status 02/28/2024 FINAL  Final  MRSA Next Gen by PCR, Nasal     Status: None   Collection Time: 02/27/24  7:49 AM   Specimen: Nasal Mucosa; Nasal Swab  Result Value Ref Range Status   MRSA by PCR Next Gen NOT DETECTED NOT DETECTED Final    Comment: (NOTE) The GeneXpert MRSA Assay (FDA approved for NASAL specimens only), is one component of a comprehensive MRSA colonization surveillance program. It is not intended to diagnose MRSA infection nor to guide or monitor treatment for MRSA infections. Test performance is not FDA approved in patients less than 59 years old. Performed at [facility], [address] [place], [place] [address]     Procedures and diagnostic studies:  DG CHEST PORT 1 VIEW Result Date: 02/28/2024 CLINICAL DATA:  [id number]. Acute on chronic respiratory failure with hypoxia. EXAM: PORTABLE CHEST 1 VIEW COMPARISON:  Portable chest 02/25/2024, CTA chest 02/25/2024. FINDINGS: 3:34 a.m. There is background widespread interstitial coarsening consistent with subpleural fibrosis. Superimposed on this is worsening patchy dense bilateral airspace disease consistent with edema, pneumonia or combination. There are small pleural effusions which appear similar. The mediastinum is stable with aortic  atherosclerosis. There is mild cardiomegaly. Central vessels are not well seen due to airspace disease. No new osseous finding. IMPRESSION: 1. Worsening patchy dense bilateral airspace disease consistent with edema, pneumonia or combination. Background chronic interstitial lung disease. 2. Small pleural effusions. 3. Aortic atherosclerosis. 4. Mild cardiomegaly. Electronically Signed   By: Francis Quam M.D.   On: 02/28/2024 03:52               LOS: 4 days   Koralee Wedeking  [facility]   Pager on [email or web address]. If 7PM-7AM, please contact night-coverage at [email or web address]     02/29/2024, 12:37 PM

## 2024-02-29 NOTE — Progress Notes (Signed)
 NAME:  Kathryn Scott, MRN:  [id number], DOB:  [birth year], LOS: 4 ADMISSION DATE:  02/25/2024, CONSULTATION DATE: 02/25/2024 REFERRING MD: CHARLENA Blanch, MD, CHIEF COMPLAINT: Acute hypoxic respiratory failure  History of Present Illness:   The patient, with idiopathic pulmonary fibrosis, presents with worsening respiratory symptoms following a recent hospitalization for pneumonia and pulmonary embolism. She is accompanied by her daughter, Rojelio.  Dyspnea and hypoxemia - Worsening shortness of breath following recent hospitalization for pneumonia and pulmonary embolism - Oxygen saturation drops to 70-71% during activities such as talking or going to the restroom, even when oxygen concentrator increased to five liters - Currently requiring 18 liters of oxygen - Discharged from hospital on Monday with instructions for oxygen therapy at three liters at rest and four liters during activity  Cough and hemoptysis - Cough with bloody mucus developed this morning  Fever - Fever developed this morning to 101  Recent hospitalization and cardiac findings - Admitted last Wednesday morning and discharged on Monday - Diagnosed with pneumonia, small pulmonary embolism, and suspected atrial fibrillation during hospitalization - Echocardiogram performed during last hospital visit showed moderate pulmonary HTN - Previous cardiac ultrasound in January of 2025 did not show any evidence of pulmonary hypertension  Idiopathic pulmonary fibrosis management - Receiving treatment with Ofev , taken consistently at home  Anticoagulation therapy - On Eliquis  for small pulmonary embolism, started at last admission  Peripheral edema - No swelling in feet  Pertinent  Medical History    has a past medical history of Asthma, Diabetes mellitus without complication (HCC), and Hypertension.   Significant Hospital Events: Including procedures, antibiotic start and stop dates in addition to other pertinent  events   7/23 pccm consult for Aoc hypoxia ?ILD flare  7/26 Worsening respiratory failure overnight requiring heated high flow nasal cannula and transition to BiPAP for desats. Given Ativan  for anxiety. Chest x-ray with worsening bilateral opacities  Interim History / Subjective:   Remains on BiPAP, more awake today  Objective    Blood pressure 118/61, pulse 66, temperature (!) 97.5 F (36.4 C), temperature source Oral, resp. rate (!) 36, height 5' 6 (1.676 m), weight 68.5 kg, SpO2 98%.    Vent Mode: PSV FiO2 (%):  [80 %-100 %] 80 % PEEP:  [6 cmH20] 6 cmH20 Pressure Support:  [16 cmH20-20 cmH20] 16 cmH20   Intake/Output Summary (Last 24 hours) at 02/29/2024 1108 Last data filed at 02/29/2024 0954 Gross per 24 hour  Intake 730.9 ml  Output --  Net 730.9 ml   Filed Weights   02/27/24 0500 02/28/24 0402 02/29/24 0431  Weight: 69.6 kg 68.9 kg 68.5 kg    Examination: Blood pressure 118/61, pulse 66, temperature (!) 97.5 F (36.4 C), temperature source Oral, resp. rate (!) 36, height 5' 6 (1.676 m), weight 68.5 kg, SpO2 98%. Gen:      No acute distress HEENT:  EOMI, sclera anicteric Neck:     No masses; no thyromegaly Lungs:    B/Lc crackles CV:         Regular rate and rhythm; no murmurs Abd:      + bowel sounds; soft, non-tender; no palpable masses, no distension Ext:    No edema; adequate peripheral perfusion Neuro: Awake, responsive   CTA 02/19/2024-segmental pulmonary embolism in the right upper lobe, new airspace consolidation, groundglass opacities in the right upper lobe, small right pleural effusion  CTA 02/24/2021-negative for pulmonary embolism.  Worsening diffuse bilateral heterogeneous groundglass opacities, mild adenopathy.  Echocardiogram 02/20/2024-LVEF 65-70%.  RV systolic function is normal, moderate elevation in PA systolic pressure  Resolved problem list   Assessment and Plan   AoC hypoxic resp failure  CAP IPF Segmental PE RUL  Small R pleural  effusion  pHTN  P PCT is low.  Of vancomycin  as MRSA PCR is negative Continue cefepime  for HAP treatment Continue steroids.  Increased dose to pulse steroids 1 g daily on 7/26for 3 days as she is decompensating Continue Lasix  for diuresis. Appears euvolumic BiPAP.  Will try to get her to [facility]NC today Eliquis  for anticoagulation  Goals of care Unfortunately this is starting to look like a flare up of IPF with poor prognosis.  Family discussion yesterday and DNR status confirmed.  Consider comfort measures if there is no improvement.  Palliative care has been consulted  Best Practice (right click and Reselect all SmartList Selections daily)   Per primary team  Signature:   Ziquan Fidel MD [facility] See Amion for pager  If no response to pager , please call [phone] until 7pm After 7:00 pm call Elink  [phone] 02/29/2024, 11:08 AM

## 2024-02-29 NOTE — Progress Notes (Signed)
 RT titrated NIV settings to facilitate transition to [facility]. Patient tolerated low BiPAP settings of 3/2, 60% and RT placed patient on HHFNC 55L/87%. Tolerating well at this point, saturations are holding at 97-100% and patient states she feels like she is breathing okay at this point. RT will continue to monitor.

## 2024-03-01 ENCOUNTER — Ambulatory Visit: Admitting: Pulmonary Disease

## 2024-03-01 LAB — CULTURE, BLOOD (ROUTINE X 2)
Culture: NO GROWTH
Culture: NO GROWTH
Special Requests: ADEQUATE

## 2024-03-01 LAB — CBC
HCT: 25.5 % — ABNORMAL LOW (ref 36.0–46.0)
Hemoglobin: 8.4 g/dL — ABNORMAL LOW (ref 12.0–15.0)
MCH: 29.7 pg (ref 26.0–34.0)
MCHC: 32.9 g/dL (ref 30.0–36.0)
MCV: 90.1 fL (ref 80.0–100.0)
Platelets: 429 K/uL — ABNORMAL HIGH (ref 150–400)
RBC: 2.83 MIL/uL — ABNORMAL LOW (ref 3.87–5.11)
RDW: 14 % (ref 11.5–15.5)
WBC: 16.1 K/uL — ABNORMAL HIGH (ref 4.0–10.5)
nRBC: 0 % (ref 0.0–0.2)

## 2024-03-01 LAB — APTT
aPTT: 51 s — ABNORMAL HIGH (ref 24–36)
aPTT: 84 s — ABNORMAL HIGH (ref 24–36)

## 2024-03-01 LAB — BASIC METABOLIC PANEL WITH GFR
Anion gap: 10 (ref 5–15)
BUN: 51 mg/dL — ABNORMAL HIGH (ref 8–23)
CO2: 22 mmol/L (ref 22–32)
Calcium: 8.8 mg/dL — ABNORMAL LOW (ref 8.9–10.3)
Chloride: 106 mmol/L (ref 98–111)
Creatinine, Ser: 1.27 mg/dL — ABNORMAL HIGH (ref 0.44–1.00)
GFR, Estimated: 44 mL/min — ABNORMAL LOW (ref >60)
Glucose, Bld: 253 mg/dL — ABNORMAL HIGH (ref 70–99)
Potassium: 3.8 mmol/L (ref 3.5–5.1)
Sodium: 138 mmol/L (ref 135–145)

## 2024-03-01 LAB — GLUCOSE, CAPILLARY
Glucose-Capillary: 227 mg/dL — ABNORMAL HIGH (ref 70–99)
Glucose-Capillary: 217 mg/dL — ABNORMAL HIGH (ref 70–99)
Glucose-Capillary: 211 mg/dL — ABNORMAL HIGH (ref 70–99)
Glucose-Capillary: 267 mg/dL — ABNORMAL HIGH (ref 70–99)
Glucose-Capillary: 229 mg/dL — ABNORMAL HIGH (ref 70–99)

## 2024-03-01 LAB — HEPARIN LEVEL (UNFRACTIONATED): Heparin Unfractionated: 0.64 [IU]/mL (ref 0.30–0.70)

## 2024-03-01 LAB — MAGNESIUM: Magnesium: 2.1 mg/dL (ref 1.7–2.4)

## 2024-03-01 MED ORDER — INSULIN GLARGINE-YFGN 100 UNIT/ML ~~LOC~~ SOLN
15.0000 [IU] | Freq: Every day | SUBCUTANEOUS | Status: DC
  Administered 2024-03-01 – 2024-03-02 (×2): 15 [IU] via SUBCUTANEOUS
  Filled 2024-03-01 (×3): qty 0.15

## 2024-03-01 MED ORDER — PREDNISONE 20 MG PO TABS
40.0000 mg | ORAL_TABLET | Freq: Every day | ORAL | Status: DC
  Administered 2024-03-03 – 2024-03-06 (×4): 40 mg via ORAL
  Filled 2024-03-01 (×4): qty 2

## 2024-03-01 NOTE — Progress Notes (Signed)
 ANTICOAGULATION CONSULT NOTE  Pharmacy Consult for heparin  Indication: pulmonary embolus  Allergies  Allergen Reactions   Lisinopril Swelling and Other (See Comments)    Swelling/systemic reaction.   Penicillins Hives   Sulfa Antibiotics Hives and Itching    Red/itching   Clindamycin/Lincomycin Hives    Patient Measurements: Height: 5' 6 (167.6 cm) Weight: 68.9 kg (151 lb 14.4 oz) IBW/kg (Calculated) : 59.3 HEPARIN  DW (KG): 68.9  Vital Signs: Temp: 98.1 F (36.7 C) (07/28 1614) Temp Source: Oral (07/28 1614) Pulse Rate: 77 (07/28 1430)  Labs: Recent Labs    02/28/24 0208 02/28/24 1844 02/28/24 1844 02/29/24 0505 02/29/24 0804 02/29/24 1611 02/29/24 1713 03/01/24 0501 03/01/24 1808  HGB 8.9*  --   --   --  9.0*  --   --  8.4*  --   HCT 28.0*  --   --   --  27.7*  --   --  25.5*  --   PLT 571*  --   --   --  440*  --   --  429*  --   APTT  --  55*   < > 52*  --    < > 77* 51* 84*  HEPARINUNFRC  --  >1.10*  --   --  >1.10*  --   --  0.64  --   CREATININE 1.30*  --   --  1.36*  --   --   --  1.27*  --    < > = values in this interval not displayed.    Estimated Creatinine Clearance: 35.8 mL/min (A) (by C-G formula based on SCr of 1.27 mg/dL (H)).  Medical History: Past Medical History:  Diagnosis Date   Asthma    Diabetes mellitus without complication (HCC)    Hypertension    Assessment: 78 yo F presents with PE (02/19/2024). PTA [facility] (LD 7/25 2022). Pt unable to take in PO and being transitioned to IV. Pharmacy consulted for heparin  dosing. Initial dosing, no bolus given due to therapeutic anticoagulation with DOAC.  Aptt 84 is therapeutic on 1400 units/hr.   Goal of Therapy:  Heparin  level 0.3-0.7 units/ml aPTT 66-102 seconds Monitor platelets by anticoagulation protocol: Yes   Plan:  Continue heparin  1400 units/hr F/u aPTT until correlates with heparin  level  Monitor daily aPTT, heparin  level, CBC, signs/symptoms of bleeding  F/u transition  back to apixaban    Jinnie Door, PharmD, BCPS, BCCP Clinical Pharmacist  Please check AMION for all [facility] Pharm[facility] phone numbers After 10:00 PM, call Main Pharmacy [phone]

## 2024-03-01 NOTE — TOC Progression Note (Addendum)
 Transition of Care [facility]) - Progression Note    Patient Details  Name: Kathryn Scott MRN: [id number] Date of Birth: June 26, 1946  Transition of Care [facility]) CM/SW Contact  Isaiah Public, LCSWA Phone Number: 03/01/2024, 2:03 PM  Clinical Narrative:     CSW received consult for possible SNF placement at time of discharge. CSW spoke with patient and with permission from patient,patients family at bedside regarding PT recommendation of SNF placement at time of discharge. Patient reports PTA she comes from home with spouse.Patient expressed understanding of PT recommendation and is agreeable to SNF placement at time of discharge. Patient gave CSW permission to fax out referral for possible SNF placement. CSW discussed insurance authorization process with patient. Patient reports she has Cpap that she wears at home and can bring with her to the facility. All questions answered. No further questions reported at this time.  CSW following to fax patient out for SNF closer to patient being medically ready for dc.Passr pending. CSW following to submit clinicals to passr closer to patient being medically stable.CSW to continue to follow and assist with discharge planning needs.   Expected Discharge Plan: Skilled Nursing Facility Barriers to Discharge: Continued Medical Work up               Expected Discharge Plan and Services In-house Referral: Clinical Social Work Discharge Planning Services: CM Consult Post Acute Care Choice: Home Health, Resumption of Svcs/PTA Provider Living arrangements for the past 2 months: Single Family Home                           HH Arranged: RN, Disease Management, PT HH Agency:  ([facility]) Date HH Agency Contacted: 02/27/24 Time HH Agency Contacted: 1510 Representative spoke with at [facility] Agency: [place]   Social Drivers of Health (SDOH) Interventions SDOH Screenings   Food Insecurity: No Food Insecurity (02/25/2024)  Housing: Low Risk   (02/25/2024)  Transportation Needs: No Transportation Needs (02/25/2024)  Utilities: Not At Risk (02/25/2024)  Financial Resource Strain: Medium Risk (09/13/2023)   Received from [facility]  Physical Activity: Unknown (09/13/2023)   Received from [facility]  Recent Concern: Physical Activity - Inactive (09/13/2023)   Received from [facility]  Social Connections: Moderately Integrated (02/25/2024)  Stress: Stress Concern Present (09/13/2023)   Received from [facility]  Tobacco Use: Medium Risk (02/26/2024)    Readmission Risk Interventions    02/27/2024    4:09 PM  Readmission Risk Prevention Plan  HRI or Home Care Consult Complete  Social Work Consult for Recovery Care Planning/Counseling Complete  Palliative Care Screening Not Applicable  Medication Review Oceanographer) Referral to Pharmacy

## 2024-03-01 NOTE — Progress Notes (Signed)
 NAME:  Kathryn Scott, MRN:  [id number], DOB:  1946/01/05, LOS: 5 ADMISSION DATE:  02/25/2024, CONSULTATION DATE: 02/25/2024 REFERRING MD: CHARLENA Blanch, MD, CHIEF COMPLAINT: Acute hypoxic respiratory failure  History of Present Illness:   The patient, with idiopathic pulmonary fibrosis, presents with worsening respiratory symptoms following a recent hospitalization for pneumonia and pulmonary embolism. She is accompanied by her daughter, Rojelio.  Dyspnea and hypoxemia - Worsening shortness of breath following recent hospitalization for pneumonia and pulmonary embolism - Oxygen saturation drops to 70-71% during activities such as talking or going to the restroom, even when oxygen concentrator increased to five liters - Currently requiring 18 liters of oxygen - Discharged from hospital on Monday with instructions for oxygen therapy at three liters at rest and four liters during activity  Cough and hemoptysis - Cough with bloody mucus developed this morning  Fever - Fever developed this morning to 101  Recent hospitalization and cardiac findings - Admitted last Wednesday morning and discharged on Monday - Diagnosed with pneumonia, small pulmonary embolism, and suspected atrial fibrillation during hospitalization - Echocardiogram performed during last hospital visit showed moderate pulmonary HTN - Previous cardiac ultrasound in January of 2025 did not show any evidence of pulmonary hypertension  Idiopathic pulmonary fibrosis management - Receiving treatment with Ofev , taken consistently at home  Anticoagulation therapy - On Eliquis  for small pulmonary embolism, started at last admission  Peripheral edema - No swelling in feet  Pertinent  Medical History    has a past medical history of Asthma, Diabetes mellitus without complication (HCC), and Hypertension.   Significant Hospital Events: Including procedures, antibiotic start and stop dates in addition to other pertinent  events   7/23 pccm consult for Aoc hypoxia ?ILD flare  7/26 Worsening respiratory failure overnight requiring heated high flow nasal cannula and transition to BiPAP for desats. Given Ativan  for anxiety. Chest x-ray with worsening bilateral opacities 7/28 oxygen requirement improving  Interim History / Subjective:   Denies any significant concerns at present Breathing feels a little bit better  Objective    Blood pressure 116/73, pulse 64, temperature 98 F (36.7 C), temperature source Oral, resp. rate (!) 24, height 5' 6 (1.676 m), weight 68.9 kg, SpO2 96%.    FiO2 (%):  [74 %-80 %] 74 %   Intake/Output Summary (Last 24 hours) at 03/01/2024 1506 Last data filed at 03/01/2024 0853 Gross per 24 hour  Intake 1348.09 ml  Output 1200 ml  Net 148.09 ml   Filed Weights   02/28/24 0402 02/29/24 0431 03/01/24 0433  Weight: 68.9 kg 68.5 kg 68.9 kg    Examination: Blood pressure 118/61, pulse 66, temperature (!) 97.5 F (36.4 C), temperature source Oral, resp. rate (!) 36, height 5' 6 (1.676 m), weight 68.5 kg, SpO2 98%. Gen:   Does not appear to be in distress HEENT: Anicteric Neck:     Supple, no JVD, no adenopathy Lungs:    Bibasal rales CV:         S1-S2 appreciated Neuro: Awake, responsive   CTA 02/19/2024-segmental pulmonary embolism in the right upper lobe, new airspace consolidation, groundglass opacities in the right upper lobe, small right pleural effusion  CTA 02/24/2021-negative for pulmonary embolism.  Worsening diffuse bilateral heterogeneous groundglass opacities, mild adenopathy.  Echocardiogram 02/20/2024-LVEF 65-70%.  RV systolic function is normal, moderate elevation in PA systolic pressure  Resolved problem list   Assessment and Plan   Acute on chronic hypoxic respiratory failure Community-acquired pneumonia IPF Subsegmental PE right upper lobe Small  right pleural effusion Pulmonary hypertension  -Continue high flow nasal cannula -Eliquis  for  anticoagulation -Was on high-dose steroids for 3 days -Start prednisone  40 mg daily from a.m.  Stop cefepime  at day 7  DNR status Palliative care involved   Best Practice (right click and Reselect all SmartList Selections daily)   Per primary team  Signature:   Jennet Epley, MD [place] P[facility] Pager: See Tracey

## 2024-03-01 NOTE — TOC PASRR Note (Cosign Needed)
 RE: Kathryn Scott  Date of Birth: 09-19-1945  Date: 03/01/2024    To Whom It May Concern:   Please be advised that the above-named patient will require a short-term nursing home stay - anticipated 30 days or less for rehabilitation and strengthening. The plan is for return home.

## 2024-03-01 NOTE — Progress Notes (Signed)
 Physical Therapy Treatment Patient Details Name: Kathryn Scott MRN: [id number] DOB: 11-03-1945 Today's Date: 03/01/2024   History of Present Illness Pt is a 78 y.o. female admitted 7/23 for SOB and hypoxia to 70s with mobility. CT negative for PE, worsening bil opacities. Pt briefly requiring BiPAP 7/26, transitioned to [facility]. PMH includes: Recent admission 7/17-7/21 with SOB, PNA, and PE, d/c home with 3L O2, PE, asthma, pulmonary fibrosis, HTN, CAD, hypothyroidism, DM II.    PT Comments  The pt was able to make good progress with mobility this session, even after having FiO2 decreased from 90% to 74% by respiratory prior to session. The pt tolerated mobility well on 55L HHFNC and FiO2 74%, with low SpO2 of 89% but largely 91-96% with gait. Pt with largest desat to 80% with coughing fit, required seated rest to recover. Pt continues to be mobilizing at largely supervision level, but benefits from assist for line management and chair follow due to limited endurance. Given limited activity tolerance and significant O2 needs, d/c recommendations updated at this time as pt is not yet able to return home with PRN assist from family at this time. Will continue to follow and attempt to progress mobility and endurance acutely to allow for safe return home.    If plan is discharge home, recommend the following: A little help with walking and/or transfers;A little help with bathing/dressing/bathroom;Assistance with cooking/housework;Help with stairs or ramp for entrance   Can travel by private vehicle     Yes  Equipment Recommendations  Rolling walker (2 wheels);Wheelchair (measurements PT);Wheelchair cushion (measurements PT);BSC/3in1    Recommendations for Other Services       Precautions / Restrictions Precautions Precautions: Fall Recall of Precautions/Restrictions: Intact Precaution/Restrictions Comments: HHFNC, FiO2 74%, 55L Restrictions Weight Bearing Restrictions Per Provider  Order: No     Mobility  Bed Mobility Overal bed mobility: Modified Independent   Rolling: Modified independent (Device/Increase time), Used rails (HOB raised)         General bed mobility comments: line management only    Transfers Overall transfer level: Needs assistance Equipment used: Rolling walker (2 wheels) Transfers: Sit to/from Stand Sit to Stand: Contact guard assist, Modified independent (Device/Increase time)   Step pivot transfers: Supervision       General transfer comment: cues for hand placement, pt able to rise and steady x4 in session without physical assist. SpO2 stable on 55L HHFNC FiO2 74%, low 89%    Ambulation/Gait Ambulation/Gait assistance: Supervision, +2 safety/equipment (chair follow and line management) Gait Distance (Feet): 12 Feet (+ 12 ft) Assistive device: Rolling walker (2 wheels) Gait Pattern/deviations: Step-through pattern Gait velocity: decreased Gait velocity interpretation: <1.8 ft/sec, indicate of risk for recurrent falls   General Gait Details: generally steady, SpO2 to 89% but largely 91-96% on 55L HHFNC with FiO2 74%. pt fatigued and needing seated rest after 12 ft ambulation     Balance Overall balance assessment: Needs assistance Sitting-balance support: Single extremity supported, Feet supported Sitting balance-Leahy Scale: Normal     Standing balance support: No upper extremity supported, During functional activity, Single extremity supported Standing balance-Leahy Scale: Fair Standing balance comment: used BUE support for gait                            Communication Communication Communication: Impaired Factors Affecting Communication: Hearing impaired  Cognition Arousal: Alert Behavior During Therapy: WFL for tasks assessed/performed   PT - Cognitive impairments: No apparent impairments  Following commands: Intact      Cueing Cueing Techniques: Verbal cues   Exercises      General Comments General comments (skin integrity, edema, etc.): SpO2 to low of 80% after coughing fit, low of 89% with mobility. 55L HHFNC with FiO2 74%      Pertinent Vitals/Pain       PT Goals (current goals can now be found in the care plan section) Acute Rehab PT Goals Patient Stated Goal: home PT Goal Formulation: With patient Time For Goal Achievement: 03/11/24 Potential to Achieve Goals: Good Progress towards PT goals: Progressing toward goals    Frequency    Min 2X/week       AM-PAC PT 6 Clicks Mobility   Outcome Measure  Help needed turning from your back to your side while in a flat bed without using bedrails?: None Help needed moving from lying on your back to sitting on the side of a flat bed without using bedrails?: A Little Help needed moving to and from a bed to a chair (including a wheelchair)?: A Little Help needed standing up from a chair using your arms (e.g., wheelchair or bedside chair)?: A Little Help needed to walk in hospital room?: Total Help needed climbing 3-5 steps with a railing? : Total 6 Click Score: 15    End of Session Equipment Utilized During Treatment: Oxygen;Gait belt Activity Tolerance: Patient tolerated treatment well;Other (comment) (limited by O2 need.) Patient left: with call bell/phone within reach;in chair Nurse Communication: Mobility status PT Visit Diagnosis: Muscle weakness (generalized) (M62.81);Difficulty in walking, not elsewhere classified (R26.2)     Time: 9152-9078 PT Time Calculation (min) (ACUTE ONLY): 34 min  Charges:    $Therapeutic Exercise: 8-22 mins PT General Charges $$ ACUTE PT VISIT: 1 Visit                     [name], PT, DPT   Acute Rehabilitation Department Office [phone] Secure Chat Communication Preferred   [name] 03/01/2024, 10:16 AM

## 2024-03-01 NOTE — Progress Notes (Signed)
   02/29/24 2230  BiPAP/CPAP/SIPAP  $ Non-Invasive Home Ventilator  Subsequent  BiPAP/CPAP/SIPAP Pt Type Adult  BiPAP/CPAP/SIPAP SERVO  Reason BIPAP/CPAP not in use Other(comment) (on standby)  Mask Type Full face mask  BiPAP/CPAP /SiPAP Vitals  Pulse Rate 61  SpO2 94 %  MEWS Score/Color  MEWS Score 0  MEWS Score Color Landy

## 2024-03-01 NOTE — Progress Notes (Signed)
 Progress Note  Kathryn Scott  FMW:[id number] DOB: 1946/02/05  DOA: 02/25/2024 PCP: Carvin [name], MD  Brief Narrative:  Kathryn Scott is a 78 y.o. female with past medical history of pulmonary embolism, idiopathic pulmonary fibrosis, community-acquired pneumonia, acute kidney injury, essential hypertension, diabetes mellitus type 2, acquired hypothyroidism presented for evaluation of respiratory distress. She was recently admitted on the 17th and discharged on the 21st found to have a pulmonary embolism. Patient was admitted for respiratory failure, interstitial pulmonary fibrosis, presumed pneumonia  Very slow and gradual improvement shown today. Palliative and pulmonology is consulted.   Assessment/Plan:   Principal Problem:   Respiratory distress Active Problems:   Pulmonary embolism (HCC)   IPF (idiopathic pulmonary fibrosis) (HCC)  Body mass index is 24.52 kg/m.  Acute on chronic hypoxic respiratory failure Recurrent pneumonia Interstitial pulmonary fibrosis with acute exacerbation: Weaned off bipap 7/27. Now on 55L 75% HHFNC and desats with coughing or minimal exertion. Was able to work with PT a bit today. MRSA negative. Procal low.  Urine strep pneumo ag negative. RVP negative. She was recently discharged on 3L with rest, 4L with activity. - Continue IV cefepime , and IV Lasix . - increased Solu-Medrol   Continue bronchodilators Follow-up with pulmonologist.   Pulmonary embolism: Recent diagnosis of PE on 02/19/2024 Continue IV heparin  and monitor heparin  level per protocol. - convert to oral tomorrow depending on progress and GOC   Pulmonary hypertension: Due to PE.   Continue IV Lasix .  Ofev  on hold for now. 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 Creatinine is stable.  1.59-1.46-1.30-1.36. Monitor  BMP Avoid nephrotoxic drugs.   Type 2 diabetes mellitus: NovoLog  as needed for hyperglycemia as well as added long acting as more hyperglycemic with higher steroid doses   Hypomagnesemia- Improved.  High magnesium  level (likely from overcorrection) down to normal    Hypothyroidism: Continue home dose Synthroid .     Anxiety S/p treatment with IV Ativan  0.5 mg on 02/28/2024.  Palliative care has been consulted because of concern for poor prognosis.  Consultants: Pulmonologist  Procedures: None  Family Communication/Anticipated D/C date and plan/Code Status   DVT prophylaxis: heparin  gtt    Code Status: Limited: Do not attempt resuscitation (DNR) -DNR-LIMITED -Do Not Intubate/DNI   Family Communication: None.  Disposition Plan: To be determined  Status is: Inpatient Remains inpatient appropriate because: Acute hypoxic respiratory failure, pneumonia  Subjective:   Pt reports feeling better today. She had SOB with exertion with PT but improved with rest.   Objective:    Vitals:   02/29/24 2230 02/29/24 2353 03/01/24 0324 03/01/24 0433  BP:  122/74  124/75  Pulse: 61 63  64  Resp:  19  19  Temp:  97.9 F (36.6 C)  98.4 F (36.9 C)  TempSrc:  Oral  Oral  SpO2: 94% 97% 95% 100%  Weight:    68.9 kg  Height:       No data found.   Intake/Output Summary (Last 24 hours) at 03/01/2024 0729 Last data filed at 03/01/2024 0400 Gross per 24 hour  Intake 1289.89 ml  Output 1200 ml  Net 89.89 ml   Filed Weights   02/28/24 0402 02/29/24 0431 03/01/24 0433  Weight: 68.9 kg 68.5 kg 68.9 kg    Exam:  GEN: NAD, she appears comfortable SKIN: Warm and dry EYES: No pallor or icterus ENT: MMM CV:  RRR PULM: Bilateral rhonchi, bibasilar rales. Speaking in short sentences ABD: soft, ND, NT, +BS CNS: AAO x 3, non focal EXT: No edema or tenderness  Data Reviewed:   I have personally reviewed following labs and imaging studies:  Labs: Labs show the following:    Basic Metabolic Panel: Recent Labs  Lab 02/26/24 0359 02/26/24 1159 02/27/24 0459 02/28/24 0208 02/29/24 0505 03/01/24 0501  NA 139  --  137 137 140 138  K 4.5  --  4.2 4.4 4.2 3.8  CL 107  --  103 105 106 106  CO2 17*  --  22 19* 18* 22  GLUCOSE 191*  --  212* 117* 216* 253*  BUN 21  --  35* 40* 46* 51*  CREATININE 1.59*  --  1.46* 1.30* 1.36* 1.27*  CALCIUM  8.4*  --  8.6* 8.6* 8.9 8.8*  MG  --  1.8 2.8* 2.1 2.0 2.1   GFR Estimated Creatinine Clearance: 35.8 mL/min (A) (by C-G formula based on SCr of 1.27 mg/dL (H)).  CBC: Recent Labs  Lab 02/25/24 1343 02/25/24 1405 02/26/24 0359 02/27/24 0459 02/28/24 0208 02/29/24 0804 03/01/24 0501  WBC 15.3*  --  16.9* 18.6* 17.8* 15.6* 16.1*  NEUTROABS 13.0*  --   --   --   --   --   --   HGB 10.3*   < > 9.2* 8.8* 8.9* 9.0* 8.4*  HCT 32.9*   < > 29.2* 26.8* 28.0* 27.7* 25.5*  MCV 95.4  --  95.1 92.1 91.8 91.1 90.1  PLT 484*  --  454* 465* 571* 440* 429*   < > = values in this interval not displayed.   Procedures and diagnostic studies:  No results found.   LOS: 5 days   Lyndsay Talamante L [employer] on [email or web address] If 7PM-7AM, please contact night-coverage at [email or web address]  03/01/2024, 7:29 AM

## 2024-03-01 NOTE — Progress Notes (Signed)
 ANTICOAGULATION CONSULT NOTE  Pharmacy Consult for heparin  Indication: pulmonary embolus  Allergies  Allergen Reactions   Lisinopril Swelling and Other (See Comments)    Swelling/systemic reaction.   Penicillins Hives   Sulfa Antibiotics Hives and Itching    Red/itching   Clindamycin/Lincomycin Hives    Patient Measurements: Height: 5' 6 (167.6 cm) Weight: 68.9 kg (151 lb 14.4 oz) IBW/kg (Calculated) : 59.3 HEPARIN  DW (KG): 68.9  Vital Signs: Temp: 98.1 F (36.7 C) (07/28 0752) Temp Source: Oral (07/28 0752) BP: 116/73 (07/28 0752) Pulse Rate: 64 (07/28 0433)  Labs: Recent Labs    02/28/24 0208 02/28/24 1844 02/28/24 1844 02/29/24 0505 02/29/24 0804 02/29/24 1611 02/29/24 1713 03/01/24 0501  HGB 8.9*  --   --   --  9.0*  --   --  8.4*  HCT 28.0*  --   --   --  27.7*  --   --  25.5*  PLT 571*  --   --   --  440*  --   --  429*  APTT  --  55*   < > 52*  --  61* 77* 51*  HEPARINUNFRC  --  >1.10*  --   --  >1.10*  --   --  0.64  CREATININE 1.30*  --   --  1.36*  --   --   --  1.27*   < > = values in this interval not displayed.    Estimated Creatinine Clearance: 35.8 mL/min (A) (by C-G formula based on SCr of 1.27 mg/dL (H)).  Medical History: Past Medical History:  Diagnosis Date   Asthma    Diabetes mellitus without complication (HCC)    Hypertension     Medications:  See MAR  Assessment: 78 yo F presents with PE (02/19/2024). PTA [facility] (LD 7/25 2022). Pt unable to take in PO and being transitioned to IV. Pharmacy consulted for heparin  dosing. Initial dosing, no bolus given due to therapeutic anticoagulation with DOAC.  -aPTT= 51 on heparin  1200 units/hr; hg= 8.4 (low/stable) -Cough with bloody mucus noted 7/27 in am  Goal of Therapy:  Heparin  level 0.3-0.7 units/ml aPTT 66-102 seconds Monitor platelets by anticoagulation protocol: Yes   Plan:  -Increase heparin  to 1400 units/hr -aPTT in 8 hrs  Kathryn Scott, PharmD Clinical  Pharmacist **Pharmacist phone directory can now be found on [email or web address] ([email or web address]).  Listed under [facility].

## 2024-03-01 NOTE — Plan of Care (Signed)
  Problem: Fluid Volume: Goal: Ability to maintain a balanced intake and output will improve Outcome: Progressing   Problem: Metabolic: Goal: Ability to maintain appropriate glucose levels will improve Outcome: Progressing   Problem: Nutritional: Goal: Maintenance of adequate nutrition will improve Outcome: Progressing   Problem: Skin Integrity: Goal: Risk for impaired skin integrity will decrease Outcome: Progressing   Problem: Tissue Perfusion: Goal: Adequacy of tissue perfusion will improve Outcome: Progressing   Problem: Clinical Measurements: Goal: Respiratory complications will improve Outcome: Progressing Goal: Cardiovascular complication will be avoided Outcome: Progressing   Problem: Activity: Goal: Risk for activity intolerance will decrease Outcome: Progressing   Problem: Nutrition: Goal: Adequate nutrition will be maintained Outcome: Progressing   Problem: Coping: Goal: Level of anxiety will decrease Outcome: Progressing

## 2024-03-01 NOTE — Progress Notes (Signed)
 Occupational Therapy Treatment Patient Details Name: Kathryn Scott MRN: [id number] DOB: May 22, 1946 Today's Date: 03/01/2024   History of present illness Pt is a 78 y.o. female admitted 7/23 for SOB and hypoxia to 70s with mobility. CT negative for PE, worsening bil opacities. Pt briefly requiring BiPAP 7/26, transitioned to [facility]. PMH includes: Recent admission 7/17-7/21 with SOB, PNA, and PE, d/c home with 3L O2, PE, asthma, pulmonary fibrosis, HTN, CAD, hypothyroidism, DM II.   OT comments  Pt was seen with Physical Therapy and is making good progression with able to complete tasks but limited by respiratory status. Pt  FiO2 decreased from 90% to 74% by respiratory prior to session by respiratory therapy. The pt tolerated mobility well on 55L HHFNC and FiO2 74%, with low SpO2 of 89% but largely 91-96% with mobility. Pt was mainly limited when having a coughing spell when going from supine to sitting. Pt would complete ADLs post set up in between mobility with PT. At this time she does not have enough tolerance for activity to complete all ADLS without assist. She may be able to need less assist as respiratory status continues to improve. Patient will benefit from continued inpatient follow up therapy, <3 hours/day.       If plan is discharge home, recommend the following:  A little help with walking and/or transfers;Assistance with cooking/housework   Equipment Recommendations  BSC/3in1;Tub/shower seat    Recommendations for Other Services      Precautions / Restrictions Precautions Precautions: Fall Recall of Precautions/Restrictions: Intact Precaution/Restrictions Comments: HHFNC, FiO2 74%, 55L Restrictions Weight Bearing Restrictions Per Provider Order: No       Mobility Bed Mobility Overal bed mobility: Modified Independent   Rolling: Modified independent (Device/Increase time), Used rails         General bed mobility comments: line management only     Transfers Overall transfer level: Needs assistance Equipment used: Rolling walker (2 wheels) Transfers: Sit to/from Stand Sit to Stand: Contact guard assist, Modified independent (Device/Increase time)     Step pivot transfers: Supervision     General transfer comment: cues for hand placement, pt able to rise and steady x4 in session without physical assist. SpO2 stable on 55L HHFNC FiO2 74%, low 89%     Balance Overall balance assessment: Needs assistance Sitting-balance support: Single extremity supported, Feet supported Sitting balance-Leahy Scale: Normal     Standing balance support: No upper extremity supported, During functional activity, Single extremity supported Standing balance-Leahy Scale: Fair Standing balance comment: used BUE support for gait                           ADL either performed or assessed with clinical judgement   ADL Overall ADL's : Needs assistance/impaired Eating/Feeding: Set up;Sitting   Grooming: Set up;Sitting   Upper Body Bathing: Set up;Sitting   Lower Body Bathing: Minimal assistance;Sit to/from stand   Upper Body Dressing : Set up;Sitting   Lower Body Dressing: Contact guard assist;Minimal assistance;Sit to/from stand   Toilet Transfer: Contact guard assist;Rolling walker (2 wheels)   Toileting- Clothing Manipulation and Hygiene: Minimal assistance;Sit to/from stand Toileting - Clothing Manipulation Details (indicate cue type and reason): Pt at this time fatigues quickly and to completly clean up they need assist in standing     Functional mobility during ADLs: Contact guard assist;Rolling walker (2 wheels)      Extremity/Trunk Assessment Upper Extremity Assessment Upper Extremity Assessment: Overall WFL for tasks assessed  Lower Extremity Assessment Lower Extremity Assessment: Defer to PT evaluation        Vision   Vision Assessment?: No apparent visual deficits   Perception     Praxis      Communication Communication Communication: Impaired Factors Affecting Communication: Hearing impaired   Cognition Arousal: Alert Behavior During Therapy: WFL for tasks assessed/performed Cognition: No apparent impairments                               Following commands: Intact        Cueing   Cueing Techniques: Verbal cues  Exercises      Shoulder Instructions       General Comments SpO2 to low of 80% after coughing fit, low of 89% with mobility. 55L HHFNC with FiO2 74%    Pertinent Vitals/ Pain       Pain Assessment Pain Assessment: No/denies pain  Home Living                                          Prior Functioning/Environment              Frequency  Min 2X/week        Progress Toward Goals  OT Goals(current goals can now be found in the care plan section)  Progress towards OT goals: Progressing toward goals  Acute Rehab OT Goals Patient Stated Goal: to get rest OT Goal Formulation: With patient Time For Goal Achievement: 03/11/24 Potential to Achieve Goals: Good ADL Goals Pt Will Perform Grooming: with modified independence;standing;sitting Pt Will Perform Lower Body Dressing: with modified independence;sit to/from stand Pt Will Transfer to Toilet: with modified independence;ambulating;regular height toilet Pt Will Perform Toileting - Clothing Manipulation and hygiene: with modified independence;sit to/from stand;sitting/lateral leans Pt Will Perform Tub/Shower Transfer: with modified independence;shower seat Additional ADL Goal #1: Pt will verbalize at least 3 energy conservation strategies to assist with ADLs  Plan      Co-evaluation                 AM-PAC OT 6 Clicks Daily Activity     Outcome Measure   Help from another person eating meals?: None Help from another person taking care of personal grooming?: A Little Help from another person toileting, which includes using toliet, bedpan, or  urinal?: A Little Help from another person bathing (including washing, rinsing, drying)?: A Little Help from another person to put on and taking off regular upper body clothing?: A Little Help from another person to put on and taking off regular lower body clothing?: A Little 6 Click Score: 19    End of Session Equipment Utilized During Treatment: Rolling walker (2 wheels)  OT Visit Diagnosis: Muscle weakness (generalized) (M62.81)   Activity Tolerance Patient tolerated treatment well   Patient Left in bed;with call bell/phone within reach;with nursing/sitter in room   Nurse Communication Mobility status        Time: 9152-9077 OT Time Calculation (min): 35 min  Charges: OT General Charges $OT Visit: 1 Visit OT Treatments $Self Care/Home Management : 8-22 mins  Warrick POUR OTR/L  Acute Rehab Services  [phone] office number   Warrick Berber 03/01/2024, 10:32 AM

## 2024-03-01 NOTE — Progress Notes (Signed)
   03/01/24 2300  BiPAP/CPAP/SIPAP  Reason BIPAP/CPAP not in use  (Pt is currently wearing HHFNC 35L / 65% and tolerating fine.)   BIPAP @ bedside if needed.

## 2024-03-01 NOTE — Consult Note (Signed)
 Consultation Note Date: 03/01/2024   Patient Name: Kathryn Scott  DOB: April 28, 1946  MRN: [id number]  Age / Sex: 78 y.o., female  PCP: Carvin [name], MD Referring Physician: Lenon Marien CROME, MD  Reason for Consultation: Establishing goals of care  HPI/Patient Profile: 78 y.o. female  with past medical history of pulmonary embolism, idiopathic pulmonary fibrosis, community-acquired pneumonia, acute kidney injury, essential hypertension, diabetes mellitus type 2, acquired hypothyroidism,  admitted on 02/25/2024 with respiratory distress and shortness of breath.   Of note, patient was recently admitted from 02/19/2024 to 7//21/2025 for pulmonary embolism, CAP and AKI.   PMT has been consulted to assist with goals of care conversation. Patient/Family face treatment option decisions, advanced directive decisions and anticipatory care needs.   Clinical Assessment and Goals of Care:  I have reviewed medical records including EPIC notes, labs and imaging, assessed the patient and then met with patient, her daughter Rojelio and husband Alm to discuss diagnosis prognosis, GOC, EOL wishes, disposition and options.   Earlier today, I visited the patient at her bedside. She appears chronically ill but is not in any acute distress. She reported coming to the hospital due to worsening shortness of breath, fever, and cough. She has not experienced a fever since last night. Previously on BIPAP, she has been successfully weaned to 55 LPM via HHFNC, maintaining oxygen saturation between 90-94%. She reports feeling generally better and breathing more easily, although she acknowledges that recently, even simple activities that previously did not cause shortness of breath now make her feel short of breath.  I introduced Palliative Medicine as specialized medical care for people living with serious illness. It focuses on providing relief from the symptoms and  stress of a serious illness. The goal is to improve quality of life for both the patient and the family.  Medical History Review and Family/Patient Understanding:  78 y.o. female  with past medical history of pulmonary embolism, idiopathic pulmonary fibrosis, community-acquired pneumonia, acute kidney injury, essential hypertension, diabetes mellitus type 2, acquired hypothyroidism,  admitted on 02/25/2024 with respiratory distress and shortness of breath.  The patient appears to have a good understanding of her current medical conditions, particularly idiopathic pulmonary fibrosis. She is proactive, conducting extensive research about her disease and participating in support groups. She recognizes that the condition is progressive and that treatment options may become limited over time.  Social History: The patient resides at home with her husband, Alm. She has three children: a daughter who lives nearby and two sons. They also have two dogs. 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 She enjoys traveling, with [place]  being one of her favorite destinations.  Functional and Nutritional State: She is ambulatory at baseline. Her appetite has been sub optimal but is improving.   Palliative Symptoms: Shortness of breath, cough, and generalized weakness.  Advance Directives: The patient indicated that she has advance directives through an HCPOA, but they need updating. I offered the assistance of our Chaplain to help complete a new HCPOA, and the family expressed their appreciation.   Code Status: Discussed this in length, patient opted for DNR/DNI.  Discussion:  The patient demonstrates a solid understanding of her current medical conditions, particularly idiopathic pulmonary fibrosis. She shares that this last few weeks has been particularly difficult because of the setbacks. She shared that she was just in the hospital last week and  now she is back. She acknowledges that this condition is  progressive and that treatment options may become limited over time. She is hopeful that she will be able to return home with husband, and be able to carry out things that she normally does.  We discussed that while we keep the hope, it is also good to balance this with the understanding of what current treatment option can realistically achieve given the progressive nature of her disease plus her other co morbidities. She also recognizes that her current limitations my possibly be her new baseline moving forward.   We discussed viable interventions and anticipatory care needs, including being vigilant for early symptoms of exacerbation and strategies to conserve energy, focusing on activities that matter most to her. We also talked about the possibility of rehabilitation post-discharge to help improve her strength and endurance. For now, we will continue with supportive care, aiming to treat what is treatable, with the hope that she will improve and be able to return home.  Additionally, we discussed her code status and its implications. She clearly expressed that if her heart stops or she stops breathing, she does not want any resuscitative measures, as she understands that the outcome would not change and may lead to more suffering. She shared that she has lived a good and meaningful life with her husband.  The difference between aggressive medical intervention and comfort care was considered in light of the patient's goals of care. Hospice and Palliative Care services outpatient were explained and offered. We discussed the MOST form and its importance in detailing and aligning healthcare preferences and goals of care. This form ensures that her treatment aligns with her values and wishes, providing clarity and guidance for her healthcare team.  Discussed the importance of continued conversation with family and the medical providers regarding  overall plan of care and treatment options, ensuring decisions are within the context of the patient's values and GOCs.   Questions and concerns were addressed.  Hard Choices booklet left for review. The family was encouraged to call with questions or concerns.  PMT will continue to support holistically.   Primary Decision-maker: PATIENT    SUMMARY OF RECOMMENDATIONS    Code Status: DNR, DNI Symptom Management: (Per attending) Continue with O2 supplement, wean down as tolerated. Inhalation to include Albuterol  PRN Cough medicine (Mucinex ) for symptom relief Tylenol  PRN for pain Continue with breathing excercises with IS and flutter valve Consult to Chaplain for spiritual support and completion of new HCPOA. Continue with palliative holistic support.    Palliative Prophylaxis:  Aspiration, Bowel Regimen, Delirium Protocol, Eye Care, Frequent Pain Assessment, Oral Care, and Turn Reposition   Psycho-social/Spiritual:  Desire for further Chaplaincy support:yes Prognosis:   The prognosis is relatively limited/poor due to the progressive nature of her idiopathic pulmonary fibrosis, compounded by her other comorbidities.  Discharge Planning: Skilled Nursing Facility for PT at the time of discharge.       Primary Diagnoses: Present on Admission:  Respiratory distress  Pulmonary embolism (HCC)  IPF (idiopathic pulmonary fibrosis) (HCC)    Physical Exam Vitals and nursing note reviewed.  Constitutional:      Appearance: She is well-developed.  HENT:     Head: Normocephalic and atraumatic.  Cardiovascular:     Rate and Rhythm: Normal rate and regular rhythm.  Pulmonary:     Comments: Dyspnea on exertion. Currently on HHFNC.   Musculoskeletal:     Comments: Generalized weakness  Skin:    General: Skin is dry.  Neurological:     General: No focal deficit  present.     Mental Status: She is alert and oriented to person, place, and time.     Vital Signs: BP 116/73 (BP  Location: Left Arm)   Pulse 64   Temp 98.1 F (36.7 C) (Oral)   Resp 20   Ht 5' 6 (1.676 m)   Wt 68.9 kg   SpO2 96%   BMI 24.52 kg/m  Pain Scale: 0-10 POSS *See Group Information*: 1-Acceptable,Awake and alert Pain Score: 0-No pain   SpO2: SpO2: 96 % O2 Device:SpO2: 96 % O2 Flow Rate: .O2 Flow Rate (L/min): 55 L/min   Palliative Assessment/Data:50%     Total time: I spent 90 minutes in the care of the patient today in the above activities and documenting the encounter.   Kathlyne JULIANNA Tracie Mickey, NP  Palliative Medicine Team Team phone # [phone]  Thank you for allowing the Palliative Medicine Team to assist in the care of this patient. Please utilize secure chat with additional questions, if there is no response within 30 minutes please call the above phone number.  Palliative Medicine Team providers are available by phone from 7am to 7pm daily and can be reached through the team cell phone.  Should this patient require assistance outside of these hours, please call the patient's attending physician.

## 2024-03-02 DIAGNOSIS — I2694 Multiple subsegmental pulmonary emboli without acute cor pulmonale: Secondary | ICD-10-CM | POA: Diagnosis not present

## 2024-03-02 DIAGNOSIS — J189 Pneumonia, unspecified organism: Secondary | ICD-10-CM | POA: Diagnosis not present

## 2024-03-02 DIAGNOSIS — J9621 Acute and chronic respiratory failure with hypoxia: Secondary | ICD-10-CM | POA: Diagnosis not present

## 2024-03-02 DIAGNOSIS — J90 Pleural effusion, not elsewhere classified: Secondary | ICD-10-CM | POA: Diagnosis not present

## 2024-03-02 DIAGNOSIS — R0603 Acute respiratory distress: Secondary | ICD-10-CM | POA: Diagnosis not present

## 2024-03-02 LAB — BASIC METABOLIC PANEL WITH GFR
Anion gap: 11 (ref 5–15)
BUN: 56 mg/dL — ABNORMAL HIGH (ref 8–23)
CO2: 25 mmol/L (ref 22–32)
Calcium: 9.1 mg/dL (ref 8.9–10.3)
Chloride: 107 mmol/L (ref 98–111)
Creatinine, Ser: 1.39 mg/dL — ABNORMAL HIGH (ref 0.44–1.00)
GFR, Estimated: 40 mL/min — ABNORMAL LOW (ref >60)
Glucose, Bld: 82 mg/dL (ref 70–99)
Potassium: 3 mmol/L — ABNORMAL LOW (ref 3.5–5.1)
Sodium: 143 mmol/L (ref 135–145)

## 2024-03-02 LAB — CBC
HCT: 27.3 % — ABNORMAL LOW (ref 36.0–46.0)
Hemoglobin: 9 g/dL — ABNORMAL LOW (ref 12.0–15.0)
MCH: 29.5 pg (ref 26.0–34.0)
MCHC: 33 g/dL (ref 30.0–36.0)
MCV: 89.5 fL (ref 80.0–100.0)
Platelets: 493 K/uL — ABNORMAL HIGH (ref 150–400)
RBC: 3.05 MIL/uL — ABNORMAL LOW (ref 3.87–5.11)
RDW: 14 % (ref 11.5–15.5)
WBC: 17.3 K/uL — ABNORMAL HIGH (ref 4.0–10.5)
nRBC: 0 % (ref 0.0–0.2)

## 2024-03-02 LAB — HEPARIN LEVEL (UNFRACTIONATED): Heparin Unfractionated: 0.67 [IU]/mL (ref 0.30–0.70)

## 2024-03-02 LAB — GLUCOSE, CAPILLARY
Glucose-Capillary: 169 mg/dL — ABNORMAL HIGH (ref 70–99)
Glucose-Capillary: 206 mg/dL — ABNORMAL HIGH (ref 70–99)
Glucose-Capillary: 227 mg/dL — ABNORMAL HIGH (ref 70–99)
Glucose-Capillary: 269 mg/dL — ABNORMAL HIGH (ref 70–99)
Glucose-Capillary: 277 mg/dL — ABNORMAL HIGH (ref 70–99)
Glucose-Capillary: 86 mg/dL (ref 70–99)

## 2024-03-02 LAB — APTT: aPTT: 120 s — ABNORMAL HIGH (ref 24–36)

## 2024-03-02 MED ORDER — APIXABAN 5 MG PO TABS
5.0000 mg | ORAL_TABLET | Freq: Two times a day (BID) | ORAL | Status: DC
  Administered 2024-03-02 – 2024-03-06 (×10): 5 mg via ORAL
  Filled 2024-03-02 (×10): qty 1

## 2024-03-02 MED ORDER — PSEUDOEPHEDRINE HCL 30 MG PO TABS
30.0000 mg | ORAL_TABLET | Freq: Every day | ORAL | Status: AC
  Administered 2024-03-02 – 2024-03-03 (×2): 30 mg via ORAL
  Filled 2024-03-02 (×2): qty 1

## 2024-03-02 MED ORDER — DM-GUAIFENESIN ER 30-600 MG PO TB12
1.0000 | ORAL_TABLET | Freq: Two times a day (BID) | ORAL | Status: AC
  Administered 2024-03-02 – 2024-03-04 (×4): 1 via ORAL
  Filled 2024-03-02 (×4): qty 1

## 2024-03-02 MED ORDER — POTASSIUM CHLORIDE CRYS ER 20 MEQ PO TBCR
40.0000 meq | EXTENDED_RELEASE_TABLET | Freq: Two times a day (BID) | ORAL | Status: AC
  Administered 2024-03-02 (×2): 40 meq via ORAL
  Filled 2024-03-02 (×2): qty 2

## 2024-03-02 NOTE — Progress Notes (Addendum)
 ANTICOAGULATION CONSULT NOTE  Pharmacy Consult for heparin  Indication: pulmonary embolus  Allergies  Allergen Reactions   Lisinopril Swelling and Other (See Comments)    Swelling/systemic reaction.   Penicillins Hives   Sulfa Antibiotics Hives and Itching    Red/itching   Clindamycin/Lincomycin Hives    Patient Measurements: Height: 5' 6 (167.6 cm) Weight: 68.9 kg (151 lb 14.4 oz) IBW/kg (Calculated) : 59.3 HEPARIN  DW (KG): 68.9  Vital Signs: Temp: 98.2 F (36.8 C) (07/29 0646) Temp Source: Oral (07/29 0646) BP: 126/67 (07/29 0646) Pulse Rate: 61 (07/29 0646)  Labs: Recent Labs    02/28/24 1844 02/29/24 0505 02/29/24 0804 02/29/24 1611 03/01/24 0501 03/01/24 1808 03/02/24 0438  HGB   < >  --  9.0*  --  8.4*  --  9.0*  HCT  --   --  27.7*  --  25.5*  --  27.3*  PLT  --   --  440*  --  429*  --  493*  APTT  --  52*  --    < > 51* 84* 120*  HEPARINUNFRC  --   --  >1.10*  --  0.64  --  0.67  CREATININE  --  1.36*  --   --  1.27*  --  1.39*   < > = values in this interval not displayed.    Estimated Creatinine Clearance: 32.7 mL/min (A) (by C-G formula based on SCr of 1.39 mg/dL (H)).  Medical History: Past Medical History:  Diagnosis Date   Asthma    Diabetes mellitus without complication (HCC)    Hypertension    Assessment: 78 yo F presents with PE (02/19/2024). PTA [facility] (LD 7/25 2022). Pt unable to take in PO and being transitioned to IV. Pharmacy consulted for heparin  dosing. Initial dosing, no bolus given due to therapeutic anticoagulation with DOAC.  aPTT 120 sec  (supratherapeutic) on 1400 units/hr. No signs or symptoms of bleeding reported. Hgb and plts stable.   Goal of Therapy:  Heparin  level 0.3-0.7 units/ml aPTT 66-102 seconds Monitor platelets by anticoagulation protocol: Yes   Plan:  Decrease heparin  to 1250 units/hr F/u aPTT and heparin  level in 8 hrs until correlation  Monitor daily aPTT, heparin  level, CBC, signs/symptoms of  bleeding  F/u transition back to apixaban    Danity Schmelzer, PharmD PGY-1 Pharmacy Resident [facility]  03/02/2024 7:48 AM

## 2024-03-02 NOTE — Plan of Care (Signed)
  Problem: Coping: Goal: Ability to adjust to condition or change in health will improve Outcome: Progressing   Problem: Fluid Volume: Goal: Ability to maintain a balanced intake and output will improve Outcome: Progressing   Problem: Health Behavior/Discharge Planning: Goal: Ability to identify and utilize available resources and services will improve Outcome: Progressing   Problem: Metabolic: Goal: Ability to maintain appropriate glucose levels will improve Outcome: Progressing   Problem: Skin Integrity: Goal: Risk for impaired skin integrity will decrease Outcome: Progressing   Problem: Tissue Perfusion: Goal: Adequacy of tissue perfusion will improve Outcome: Progressing   Problem: Clinical Measurements: Goal: Respiratory complications will improve Outcome: Progressing Goal: Cardiovascular complication will be avoided Outcome: Progressing   Problem: Coping: Goal: Level of anxiety will decrease Outcome: Progressing   Problem: Skin Integrity: Goal: Risk for impaired skin integrity will decrease Outcome: Progressing   Problem: Activity: Goal: Ability to tolerate increased activity will improve Outcome: Progressing

## 2024-03-02 NOTE — TOC Progression Note (Signed)
 Transition of Care [facility]) - Progression Note    Patient Details  Name: Kathryn Scott MRN: [id number] Date of Birth: 1946-01-23  [facility] of [facility] CM/SW Contact  Isaiah Public, LCSWA Phone Number: 03/02/2024, 10:55 AM  Clinical Narrative:     CSW following to fax patient out for SNF closer to patient being medically ready for dc.Passr pending. CSW following to submit clinicals to passr closer to patient being medically stable.CSW to continue to follow and assist with discharge planning needs.   Expected Discharge Plan: Skilled Nursing Facility Barriers to Discharge: Continued Medical Work up               Expected Discharge Plan and Services In-house Referral: Clinical Social Work Discharge Planning Services: CM Consult Post Acute Care Choice: Home Health, Resumption of Svcs/PTA Provider Living arrangements for the past 2 months: Single Family Home                           HH Arranged: RN, Disease Management, PT HH Agency:  ([facility]) Date HH Agency Contacted: 02/27/24 Time HH Agency Contacted: 1510 Representative spoke with at [facility] Agency: [place]   Social Drivers of Health (SDOH) Interventions SDOH Screenings   Food Insecurity: No Food Insecurity (02/25/2024)  Housing: Low Risk  (02/25/2024)  Transportation Needs: No Transportation Needs (02/25/2024)  Utilities: Not At Risk (02/25/2024)  Financial Resource Strain: Medium Risk (09/13/2023)   Received from [facility]  Physical Activity: Unknown (09/13/2023)   Received from [facility]  Recent Concern: Physical Activity - Inactive (09/13/2023)   Received from [facility]  Social Connections: Moderately Integrated (02/25/2024)  Stress: Stress Concern Present (09/13/2023)   Received from [facility]  Tobacco Use: Medium Risk (02/26/2024)    Readmission Risk Interventions    02/27/2024    4:09 PM  Readmission Risk Prevention Plan  HRI or Home Care Consult Complete  Social Work Consult for  Recovery Care Planning/Counseling Complete  Palliative Care Screening Not Applicable  Medication Review Oceanographer) Referral to Pharmacy

## 2024-03-02 NOTE — Progress Notes (Signed)
 PT Cancellation Note  Patient Details Name: Kathryn Scott MRN: [id number] DOB: 1945-11-03   Cancelled Treatment:    Reason Eval/Treat Not Completed: Fatigue/lethargy limiting ability to participatethis afternoon. Pt reports more frequent difficulty breathing and desaturations, asking to rest for today and have PT check back tomorrow. Will continue to follow and progress as tolerated.   [name], PT, DPT   Acute Rehabilitation Department Office [phone] Secure Chat Communication Preferred   [name] 03/02/2024, 1:25 PM

## 2024-03-02 NOTE — Progress Notes (Signed)
 This chaplain responded to PMT NP-Erwin consult for spiritual care. The chaplain understands the Pt. requested support and updating of HCPOA.  The chaplain reviewed the chart notes and received an update from the unit before the visit.  The chaplain understands the Pt. is experiencing SOB today. The Pt. husband-Kathryn Scott is at the bedside monitoring her stats as the Pt. tells the story of how she met her husband.  The chaplain understands the Pt. prefers a revisit on Wednesday.  Chaplain Leeroy Hummer [phone]

## 2024-03-02 NOTE — Progress Notes (Signed)
 Progress Note  Kathryn Scott  FMW:[id number] DOB: 04/14/1946  DOA: 02/25/2024 PCP: Carvin [name], MD  Brief Narrative:  Kathryn Scott is a 78 y.o. female with past medical history of pulmonary embolism, idiopathic pulmonary fibrosis, community-acquired pneumonia, acute kidney injury, essential hypertension, diabetes mellitus type 2, acquired hypothyroidism presented for evaluation of respiratory distress. She was recently admitted on the 17th and discharged on the 21st found to have a pulmonary embolism. Patient was admitted for respiratory failure, interstitial pulmonary fibrosis, presumed pneumonia  Continues to show very slow and gradual improvement. Palliative and pulmonology is consulted.   Assessment/Plan:   Principal Problem:   Respiratory distress Active Problems:   Pulmonary embolism (HCC)   IPF (idiopathic pulmonary fibrosis) (HCC)  Body mass index is 24.52 kg/m.  Acute on chronic hypoxic respiratory failure Recurrent pneumonia Interstitial pulmonary fibrosis with acute exacerbation: Weaned off bipap 7/27. Now on 20L 60% HHFNC and desats with minimal exertion or sleeping occasionally (not using her prescribed cpap). Was able to work with PT a bit today. MRSA negative. Procal low. Urine strep pneumo ag negative. RVP negative. She was recently discharged on 3L with rest, 4L with activity. - Continue IV cefepime  x7 days (ending today), and IV Lasix . - increased Solu-Medrol , continues on prednisone  Continue bronchodilators Follow-up with pulmonologist.   Pulmonary embolism: Recent diagnosis of PE on 02/19/2024 - IV heparin  being converted back to eliquis  today.    Pulmonary hypertension: Due to PE.   Continue IV Lasix .  Ofev  on hold for now. 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 Creatinine is stable  close to baseline.  1.39 today Monitor BMP Avoid nephrotoxic drugs.   Type 2 diabetes mellitus: NovoLog  as needed for hyperglycemia as well as added long acting as more hyperglycemic with higher steroid doses   Hypomagnesemia- resolved with correction   Hypothyroidism: Continue home dose Synthroid .    Anxiety S/p treatment with IV Ativan  0.5 mg on 02/28/2024. Continue sertraline    Palliative care has been consulted because of concern for poor prognosis. She and family confirm DNR/DNI  Consultants: Pulmonologist Palliative   Procedures: None  Family Communication/Anticipated D/C date and plan/Code Status   DVT prophylaxis: heparin  gtt> eliquis      Code Status: Limited: Do not attempt resuscitation (DNR) -DNR-LIMITED -Do Not Intubate/DNI   Family Communication: None.  Disposition Plan: To be determined  Status is: Inpatient Remains inpatient appropriate because: Acute hypoxic respiratory failure, pneumonia  Subjective:   Pt reports feeling tired today. Her breathing feels stable. Looking forward to going home.   Objective:    Vitals:   03/01/24 2300 03/02/24 0037 03/02/24 0224 03/02/24 0646  BP:  (!) 141/68  126/67  Pulse: (!) 55 (!) 59 (!) 59 61  Resp:  20 19   Temp:  98.3 F (36.8 C)  98.2 F (36.8 C)  TempSrc:  Oral  Oral  SpO2: 92% 97% 93% 94%  Weight:      Height:       No data found.   Intake/Output Summary (Last 24 hours) at 03/02/2024 0747 Last data filed at 03/02/2024 0528 Gross per 24 hour  Intake 448.6 ml  Output 1250 ml  Net -801.4 ml   Filed Weights   02/28/24 0402 02/29/24 0431 03/01/24 0433  Weight: 68.9 kg 68.5 kg 68.9 kg    Exam: GEN: NAD, she appears comfortable SKIN: Warm and dry  EYES: No pallor or icterus ENT: MMM CV: RRR PULM: Bilateral rhonchi, bibasilar rales. Speaking in full sentences ABD: soft, ND, NT, +BS CNS: AAO x 3, non focal EXT: No edema or tenderness  Data Reviewed:   I have personally reviewed following  labs and imaging studies:  Labs: Labs show the following:   Basic Metabolic Panel: Recent Labs  Lab 02/26/24 1159 02/27/24 0459 02/28/24 0208 02/29/24 0505 03/01/24 0501 03/02/24 0438  NA  --  137 137 140 138 143  K  --  4.2 4.4 4.2 3.8 3.0*  CL  --  103 105 106 106 107  CO2  --  22 19* 18* 22 25  GLUCOSE  --  212* 117* 216* 253* 82  BUN  --  35* 40* 46* 51* 56*  CREATININE  --  1.46* 1.30* 1.36* 1.27* 1.39*  CALCIUM   --  8.6* 8.6* 8.9 8.8* 9.1  MG 1.8 2.8* 2.1 2.0 2.1  --    GFR Estimated Creatinine Clearance: 32.7 mL/min (A) (by C-G formula based on SCr of 1.39 mg/dL (H)).  CBC: Recent Labs  Lab 02/25/24 1343 02/25/24 1405 02/27/24 0459 02/28/24 0208 02/29/24 0804 03/01/24 0501 03/02/24 0438  WBC 15.3*   < > 18.6* 17.8* 15.6* 16.1* 17.3*  NEUTROABS 13.0*  --   --   --   --   --   --   HGB 10.3*   < > 8.8* 8.9* 9.0* 8.4* 9.0*  HCT 32.9*   < > 26.8* 28.0* 27.7* 25.5* 27.3*  MCV 95.4   < > 92.1 91.8 91.1 90.1 89.5  PLT 484*   < > 465* 571* 440* 429* 493*   < > = values in this interval not displayed.   Procedures and diagnostic studies:  No results found.   LOS: 6 days   Hemi Chacko L [employer] on [email or web address]. If 7PM-7AM, please contact night-coverage at [email or web address]  03/02/2024, 7:47 AM

## 2024-03-02 NOTE — Progress Notes (Addendum)
 NAME:  Kathryn Scott, MRN:  [id number], DOB:  12/27/1945, LOS: 6 ADMISSION DATE:  02/25/2024, CONSULTATION DATE: 02/25/2024 REFERRING MD: CHARLENA Blanch, MD, CHIEF COMPLAINT: Acute hypoxic respiratory failure  History of Present Illness:   The patient, with idiopathic pulmonary fibrosis, presents with worsening respiratory symptoms following a recent hospitalization for pneumonia and pulmonary embolism. She is accompanied by her daughter, Rojelio.  Dyspnea and hypoxemia - Worsening shortness of breath following recent hospitalization for pneumonia and pulmonary embolism - Oxygen saturation drops to 70-71% during activities such as talking or going to the restroom, even when oxygen concentrator increased to five liters - Currently requiring 18 liters of oxygen - Discharged from hospital on Monday with instructions for oxygen therapy at three liters at rest and four liters during activity  Cough and hemoptysis - Cough with bloody mucus developed this morning  Fever - Fever developed this morning to 101  Recent hospitalization and cardiac findings - Admitted last Wednesday morning and discharged on Monday - Diagnosed with pneumonia, small pulmonary embolism, and suspected atrial fibrillation during hospitalization - Echocardiogram performed during last hospital visit showed moderate pulmonary HTN - Previous cardiac ultrasound in January of 2025 did not show any evidence of pulmonary hypertension  Idiopathic pulmonary fibrosis management - Receiving treatment with Ofev , taken consistently at home  Anticoagulation therapy - On Eliquis  for small pulmonary embolism, started at last admission  Peripheral edema - No swelling in feet  Pertinent  Medical History    has a past medical history of Asthma, Diabetes mellitus without complication (HCC), and Hypertension.   Significant Hospital Events: Including procedures, antibiotic start and stop dates in addition to other pertinent  events   7/23 pccm consult for Aoc hypoxia ?ILD flare  7/26 Worsening respiratory failure overnight requiring heated high flow nasal cannula and transition to BiPAP for desats. Given Ativan  for anxiety. Chest x-ray with worsening bilateral opacities 7/28 oxygen requirement improving  Interim History / Subjective:   HFNC stable at 30L 65% Pt states that her breathing feels better as long as she does not move out of bed   Objective    Blood pressure 126/64, pulse 67, temperature 97.7 F (36.5 C), temperature source Oral, resp. rate 18, height 5' 6 (1.676 m), weight 68.9 kg, SpO2 92%.    FiO2 (%):  [55 %-70 %] 70 %   Intake/Output Summary (Last 24 hours) at 03/02/2024 0928 Last data filed at 03/02/2024 9471 Gross per 24 hour  Intake 387.4 ml  Output 1250 ml  Net -862.6 ml   Filed Weights   02/28/24 0402 02/29/24 0431 03/01/24 0433  Weight: 68.9 kg 68.5 kg 68.9 kg   General:  chronically and acutely ill-appearing F resting in bed in NAD  HEENT: MM pink/moist Neuro: alert and oriented  CV: s1s2 rrr, no m/r/g PULM:  scattered rales and decreased basilar air entry bilaterally, no tachypnea or accessory muscle use on HFNC  GI: soft, non-tender  Extremities: warm/dry, no edema     CTA 02/19/2024-segmental pulmonary embolism in the right upper lobe, new airspace consolidation, groundglass opacities in the right upper lobe, small right pleural effusion  CTA 02/24/2021-negative for pulmonary embolism.  Worsening diffuse bilateral heterogeneous groundglass opacities, mild adenopathy.  Echocardiogram 02/20/2024-LVEF 65-70%.  RV systolic function is normal, moderate elevation in PA systolic pressure  Resolved problem list   Assessment and Plan   Acute on chronic hypoxic respiratory failure Community-acquired pneumonia IPF Subsegmental PE right upper lobe Small right pleural effusion Pulmonary hypertension  -Continue  high flow nasal cannula, titrate O2 as needed for sat goal  >88% -Eliquis  for anticoagulation -Was on high-dose steroids for 3 days -continue prednisone  40 mg daily from a.m. -completes one week cefepime  today, persistent leukocytosis likely secondary to steroids, no fever or positive culture growth  DNR status Palliative care involved   Best Practice (right click and Reselect all SmartList Selections daily)   Per primary team  Signature:   Leita SAUNDERS Gleason, PA-C [facility] See Amion for pager If no response to pager , please call [phone] until 7pm After 7:00 pm call Elink  [phone] \  Patient seen, independently examined, care plan was formulated and discussed with Leita Gleason as per documentation Patient with idiopathic pulmonary fibrosis Admitted with exacerbation of IPF  Has been on high-dose steroids, transition to prednisone   She appears to be improving  oxygen requirement is improving Completing course of antibiotics  Continue prednisone  40 mg daily Continue Eliquis  for anticoagulation Transition off high flow nasal cannula as tolerated to keep saturation goals greater than 88%  Appears to be stabilizing Continue other lines of care

## 2024-03-03 DIAGNOSIS — Z515 Encounter for palliative care: Secondary | ICD-10-CM

## 2024-03-03 DIAGNOSIS — J189 Pneumonia, unspecified organism: Secondary | ICD-10-CM | POA: Diagnosis not present

## 2024-03-03 DIAGNOSIS — I2694 Multiple subsegmental pulmonary emboli without acute cor pulmonale: Secondary | ICD-10-CM | POA: Diagnosis not present

## 2024-03-03 DIAGNOSIS — J90 Pleural effusion, not elsewhere classified: Secondary | ICD-10-CM | POA: Diagnosis not present

## 2024-03-03 DIAGNOSIS — R0603 Acute respiratory distress: Secondary | ICD-10-CM | POA: Diagnosis not present

## 2024-03-03 DIAGNOSIS — J9621 Acute and chronic respiratory failure with hypoxia: Secondary | ICD-10-CM | POA: Diagnosis not present

## 2024-03-03 LAB — BASIC METABOLIC PANEL WITH GFR
Anion gap: 8 (ref 5–15)
BUN: 55 mg/dL — ABNORMAL HIGH (ref 8–23)
CO2: 23 mmol/L (ref 22–32)
Calcium: 9.1 mg/dL (ref 8.9–10.3)
Chloride: 108 mmol/L (ref 98–111)
Creatinine, Ser: 1.22 mg/dL — ABNORMAL HIGH (ref 0.44–1.00)
GFR, Estimated: 46 mL/min — ABNORMAL LOW (ref >60)
Glucose, Bld: 99 mg/dL (ref 70–99)
Potassium: 4 mmol/L (ref 3.5–5.1)
Sodium: 139 mmol/L (ref 135–145)

## 2024-03-03 LAB — GLUCOSE, CAPILLARY
Glucose-Capillary: 103 mg/dL — ABNORMAL HIGH (ref 70–99)
Glucose-Capillary: 139 mg/dL — ABNORMAL HIGH (ref 70–99)
Glucose-Capillary: 155 mg/dL — ABNORMAL HIGH (ref 70–99)
Glucose-Capillary: 159 mg/dL — ABNORMAL HIGH (ref 70–99)
Glucose-Capillary: 159 mg/dL — ABNORMAL HIGH (ref 70–99)
Glucose-Capillary: 170 mg/dL — ABNORMAL HIGH (ref 70–99)
Glucose-Capillary: 88 mg/dL (ref 70–99)

## 2024-03-03 MED ORDER — PANTOPRAZOLE SODIUM 40 MG PO TBEC
40.0000 mg | DELAYED_RELEASE_TABLET | Freq: Two times a day (BID) | ORAL | Status: DC
  Administered 2024-03-03 – 2024-03-06 (×7): 40 mg via ORAL
  Filled 2024-03-03 (×7): qty 1

## 2024-03-03 MED ORDER — INSULIN GLARGINE-YFGN 100 UNIT/ML ~~LOC~~ SOLN
8.0000 [IU] | Freq: Every day | SUBCUTANEOUS | Status: DC
  Administered 2024-03-03 – 2024-03-06 (×4): 8 [IU] via SUBCUTANEOUS
  Filled 2024-03-03 (×5): qty 0.08

## 2024-03-03 MED ORDER — HYDROXYZINE HCL 25 MG PO TABS
25.0000 mg | ORAL_TABLET | Freq: Once | ORAL | Status: AC
  Administered 2024-03-03: 25 mg via ORAL
  Filled 2024-03-03: qty 1

## 2024-03-03 NOTE — Progress Notes (Signed)
 This chaplain is present for F/U spiritual care. The Pt. husband-Kathryn Scott is visiting at the bedside.  The Pt. describes her day as having ups and downs with no further discussion. Both the chaplain and the Pt. agreed to limit the conversation today. The chaplain provided education on how to contact a chaplain if needed through the RN.  Chaplain Leeroy Hummer [phone]

## 2024-03-03 NOTE — Progress Notes (Signed)
   NAME:  Kathryn Scott, MRN:  [id number], DOB:  [birth year], LOS: 7 ADMISSION DATE:  02/25/2024, CONSULTATION DATE: 02/25/2024 REFERRING MD: CHARLENA Blanch, MD, CHIEF COMPLAINT: Acute hypoxic respiratory failure  History of Present Illness:   The patient, with idiopathic pulmonary fibrosis, presents with worsening respiratory symptoms following a recent hospitalization for pneumonia and pulmonary embolism. She is accompanied by her daughter, Rojelio.  Dyspnea and hypoxemia - Worsening shortness of breath following recent hospitalization for pneumonia and pulmonary embolism - Oxygen saturation drops to 70-71% during activities such as talking or going to the restroom, even when oxygen concentrator increased to five liters - Currently requiring 18 liters of oxygen - Discharged from hospital on Monday with instructions for oxygen therapy at three liters at rest and four liters during activity  Cough and hemoptysis - Cough with bloody mucus developed this morning  Fever - Fever developed this morning to 101  Recent hospitalization and cardiac findings - Admitted last Wednesday morning and discharged on Monday - Diagnosed with pneumonia, small pulmonary embolism, and suspected atrial fibrillation during hospitalization - Echocardiogram performed during last hospital visit showed moderate pulmonary HTN - Previous cardiac ultrasound in January of 2025 did not show any evidence of pulmonary hypertension  Idiopathic pulmonary fibrosis management - Receiving treatment with Ofev , taken consistently at home  Anticoagulation therapy - On Eliquis  for small pulmonary embolism, started at last admission  Peripheral edema - No swelling in feet  Pertinent  Medical History    has a past medical history of Asthma, Diabetes mellitus without complication (HCC), and Hypertension.   Significant Hospital Events: Including procedures, antibiotic start and stop dates in addition to other pertinent  events   7/23 pccm consult for Aoc hypoxia ?ILD flare  7/26 Worsening respiratory failure overnight requiring heated high flow nasal cannula and transition to BiPAP for desats. Given Ativan  for anxiety. Chest x-ray with worsening bilateral opacities 7/28 oxygen requirement improving 7/30 remains on high flow oxygen  Interim History / Subjective:   Remains on high flow oxygen, looks tired  Objective    Blood pressure 123/68, pulse 65, temperature 98.3 F (36.8 C), temperature source Oral, resp. rate (!) 32, height 5' 6 (1.676 m), weight 68.8 kg, SpO2 94%.    FiO2 (%):  [60 %-80 %] 70 %   Intake/Output Summary (Last 24 hours) at 03/03/2024 1836 Last data filed at 03/03/2024 1625 Gross per 24 hour  Intake 236 ml  Output 2050 ml  Net -1814 ml   Filed Weights   02/29/24 0431 03/01/24 0433 03/03/24 0439  Weight: 68.5 kg 68.9 kg 68.8 kg   General: Chronically ill-appearing, frail HEENT: Moist oral mucosa Neuro: Alert and oriented x 3 CV: S1-S2 appreciated PULM: Bilateral rales GI: soft, non-tender  Extremities: warm/dry, no edema     CTA 02/19/2024-segmental pulmonary embolism in the right upper lobe, new airspace consolidation, groundglass opacities in the right upper lobe, small right pleural effusion  CTA 02/24/2021-negative for pulmonary embolism.  Worsening diffuse bilateral heterogeneous groundglass opacities, mild adenopathy.  Echocardiogram 02/20/2024-LVEF 65-70%.  RV systolic function is normal, moderate elevation in PA systolic pressure  Resolved problem list   Assessment and Plan   Acute on chronic hypoxic respiratory failure Community-acquired pneumonia Subsegmental PE right upper lobe Small pleural effusion Pulmonary hypertension  On prednisone  Did have 3 days of high-dose steroids Continue Eliquis  for anticoagulation Continue high flow nasal cannula Completed course of antibiotics  Will follow

## 2024-03-03 NOTE — Progress Notes (Signed)
 Physical Therapy Treatment Patient Details Name: Kathryn Scott MRN: [id number] DOB: [birth year] Today's Date: 03/03/2024   History of Present Illness Pt is a 78 y.o. female admitted 7/23 for SOB and hypoxia to 70s with mobility. CT negative for PE, worsening bil opacities. Pt briefly requiring BiPAP 7/26, transitioned to [facility]. PMH includes: Recent admission 7/17-7/21 with SOB, PNA, and PE, d/c home with 3L O2, PE, asthma, pulmonary fibrosis, HTN, CAD, hypothyroidism, DM II.    PT Comments  The pt presents with decreased activity tolerance this session due to increased frequency of coughing fits and increased fatigue. She required minA to complete transfer to sitting EOB and minA to maintain sitting balance. With coughing, pt desat to 70% with good pleth and required 8 min supine rest to recover to 90s. Educated on LE ROM and muscle activation to complete in bed.    If plan is discharge home, recommend the following: A little help with walking and/or transfers;A little help with bathing/dressing/bathroom;Assistance with cooking/housework;Help with stairs or ramp for entrance   Can travel by private vehicle     Yes  Equipment Recommendations  Rolling walker (2 wheels);Wheelchair (measurements PT);Wheelchair cushion (measurements PT);BSC/3in1    Recommendations for Other Services       Precautions / Restrictions Precautions Precautions: Fall Recall of Precautions/Restrictions: Intact Precaution/Restrictions Comments: HHFNC, FiO2 85%, 30L Restrictions Weight Bearing Restrictions Per Provider Order: No     Mobility  Bed Mobility Overal bed mobility: Needs Assistance Bed Mobility: Supine to Sit, Sit to Supine     Supine to sit: Min assist, HOB elevated, Used rails Sit to supine: Mod assist, HOB elevated   General bed mobility comments: min-modA due to pt fatigue, assist to complete scoot to EOB    Transfers                   General transfer comment: deferred  due to desat and fatigue           Balance Overall balance assessment: Needs assistance Sitting-balance support: Single extremity supported, Feet supported Sitting balance-Leahy Scale: Normal     Standing balance support: No upper extremity supported, During functional activity, Single extremity supported Standing balance-Leahy Scale: Fair Standing balance comment: used BUE support for gait                            Communication Communication Communication: Impaired Factors Affecting Communication: Hearing impaired  Cognition Arousal: Alert Behavior During Therapy: WFL for tasks assessed/performed   PT - Cognitive impairments: No apparent impairments                         Following commands: Intact      Cueing Cueing Techniques: Verbal cues  Exercises General Exercises - Lower Extremity Long Arc Quad: AROM, Both, 10 reps Heel Raises: AROM, Both, 10 reps    General Comments General comments (skin integrity, edema, etc.): SpO2 to 70 with coughing at EOB, recovered to 94% after ~8 min supine rest. HHFNC 30L at 85%      Pertinent Vitals/Pain Pain Assessment Pain Assessment: No/denies pain     PT Goals (current goals can now be found in the care plan section) Acute Rehab PT Goals Patient Stated Goal: home PT Goal Formulation: With patient Time For Goal Achievement: 03/11/24 Potential to Achieve Goals: Good Progress towards PT goals: Progressing toward goals    Frequency    Min 2X/week  AM-PAC PT 6 Clicks Mobility   Outcome Measure  Help needed turning from your back to your side while in a flat bed without using bedrails?: A Little Help needed moving from lying on your back to sitting on the side of a flat bed without using bedrails?: A Little Help needed moving to and from a bed to a chair (including a wheelchair)?: A Lot Help needed standing up from a chair using your arms (e.g., wheelchair or bedside chair)?: A  Lot Help needed to walk in hospital room?: Total Help needed climbing 3-5 steps with a railing? : Total 6 Click Score: 12    End of Session Equipment Utilized During Treatment: Oxygen;Gait belt Activity Tolerance: Patient tolerated treatment well;Other (comment) (limited by O2 need.) Patient left: with call bell/phone within reach;in bed;with bed alarm set;with family/visitor present Nurse Communication: Mobility status PT Visit Diagnosis: Muscle weakness (generalized) (M62.81);Difficulty in walking, not elsewhere classified (R26.2)     Time: 8655-8590 PT Time Calculation (min) (ACUTE ONLY): 25 min  Charges:    $Therapeutic Exercise: 8-22 mins $Therapeutic Activity: 8-22 mins PT General Charges $$ ACUTE PT VISIT: 1 Visit                     [name], PT, DPT   Acute Rehabilitation Department Office [phone] Secure Chat Communication Preferred   [name] 03/03/2024, 4:01 PM

## 2024-03-03 NOTE — TOC Progression Note (Signed)
 Transition of Care [facility]) - Progression Note    Patient Details  Name: Kathryn Scott MRN: [id number] Date of Birth: 05/26/1946  Transition of Care [facility]) CM/SW Contact  Isaiah Public, LCSWA Phone Number: 03/03/2024, 10:33 AM  Clinical Narrative:     CSW following to fax patient out for SNF closer to patient being medically stable for dc. CSW will continue to follow and assist with patients dc planning needs.  Expected Discharge Plan: Skilled Nursing Facility Barriers to Discharge: Continued Medical Work up               Expected Discharge Plan and Services In-house Referral: Clinical Social Work Discharge Planning Services: CM Consult Post Acute Care Choice: Home Health, Resumption of Svcs/PTA Provider Living arrangements for the past 2 months: Single Family Home                           HH Arranged: RN, Disease Management, PT HH Agency:  ([facility]) Date HH Agency Contacted: 02/27/24 Time HH Agency Contacted: 1510 Representative spoke with at [facility] Agency: [facility]   Social Drivers of Health (SDOH) Interventions SDOH Screenings   Food Insecurity: No Food Insecurity (02/25/2024)  Housing: Low Risk  (02/25/2024)  Transportation Needs: No Transportation Needs (02/25/2024)  Utilities: Not At Risk (02/25/2024)  Financial Resource Strain: Medium Risk (09/13/2023)   Received from [facility]  Physical Activity: Unknown (09/13/2023)   Received from [facility]  Recent Concern: Physical Activity - Inactive (09/13/2023)   Received from [facility]  Social Connections: Moderately Integrated (02/25/2024)  Stress: Stress Concern Present (09/13/2023)   Received from [facility]  Tobacco Use: Medium Risk (02/26/2024)    Readmission Risk Interventions    02/27/2024    4:09 PM  Readmission Risk Prevention Plan  HRI or Home Care Consult Complete  Social Work Consult for Recovery Care Planning/Counseling Complete  Palliative Care Screening Not Applicable   Medication Review Oceanographer) Referral to Pharmacy

## 2024-03-03 NOTE — Progress Notes (Signed)
 PHARMACIST - PHYSICIAN COMMUNICATION  CONCERNING: IV to Oral Route Change Policy  RECOMMENDATION: This patient is receiving pantoprazole  by the intravenous route.  Based on criteria approved by the Pharmacy and Therapeutics Committee, the intravenous pantoprazole  is being converted to the equivalent oral dose form.   DESCRIPTION: These criteria include: The patient is eating (either orally or via tube) and/or has been taking other orally administered medications for a least 24 hours The patient has no evidence of active gastrointestinal bleeding or impaired GI absorption (gastrectomy, short bowel, patient on TNA or NPO).  If you have questions about this conversion, please contact the Pharmacy Department  []   [phone] )  Kathryn Scott []   [phone] )  Kathryn Scott [x]   [phone] )  Kathryn Scott []   [phone] )  Kathryn Scott []   [phone] )  Kathryn Scott   Amon Rocher, PharmD PGY-1 Pharmacy Resident [facility] 03/03/2024 10:32 AM

## 2024-03-03 NOTE — Progress Notes (Addendum)
 Daily Progress Note   Patient Name: Kathryn Scott       Date: 03/03/2024 DOB: Apr 28, 1946  Age: 78 y.o. MRN#: [id number] Attending Physician: Lenon Marien CROME, MD Primary Care Physician: Carvin [name], MD Admit Date: 02/25/2024  Reason for Consultation/Follow-up: Establishing goals of care  Subjective: 78 y.o. female  with past medical history of pulmonary embolism, idiopathic pulmonary fibrosis, community-acquired pneumonia, acute kidney injury, essential hypertension, diabetes mellitus type 2, acquired hypothyroidism,  admitted on 02/25/2024 with respiratory distress and shortness of breath.   I visited the patient at the bedside today, with her husband present. She appears frail and chronically ill but is not in any acute distress. The patient continues to report dyspnea on exertion. Respiratory therapy noted episodes of coughing earlier today, which have led to increased oxygen requirements. She is currently maintaining an oxygen saturation of 96% on 30 L/min, 70% FiO2 via HFNC. The patient continues to feel weak and has a poor appetite. She remains hopeful for improvement and wishes to return home. She understands that the discharge plan includes a short-term stay at a skilled nursing facility for therapy, and she is agreeable to this plan.  Length of Stay: 7  Current Medications: Scheduled Meds:   apixaban   5 mg Oral BID   carvedilol   6.25 mg Oral BID   dextromethorphan -guaiFENesin   1 tablet Oral BID   fluticasone  furoate-vilanterol  1 puff Inhalation Daily   furosemide   40 mg Intravenous Daily   insulin  aspart  0-15 Units Subcutaneous Q4H   insulin  glargine-yfgn  8 Units Subcutaneous Daily   levothyroxine   75 mcg Oral Q0600   montelukast   10 mg Oral QHS    pantoprazole   40 mg Oral BID   predniSONE   40 mg Oral Q breakfast   rosuvastatin   40 mg Oral Daily   sertraline   100 mg Oral QHS   sodium bicarbonate   650 mg Oral BID   sodium chloride  flush  3 mL Intravenous Q12H    Continuous Infusions:   PRN Meds: acetaminophen  **OR** acetaminophen , albuterol , melatonin, mouth rinse, sodium chloride  flush  Physical Exam Vitals and nursing note reviewed.  Constitutional:      Appearance: She is well-developed.  HENT:     Head: Normocephalic and atraumatic.  Cardiovascular:     Rate and Rhythm: Normal rate.  Pulmonary:     Comments: Dyspnea on exertion. O2 supplement noted via HFNC.   Genitourinary:    Comments: Purewick in place.   Musculoskeletal:     Comments: Generalized weakness   Skin:    General: Skin is warm and dry.  Neurological:     General: No focal deficit present.     Mental Status: She is alert and oriented to person, place, and time.  Psychiatric:        Mood and Affect: Mood normal.             Vital Signs: BP 135/68 (BP Location: Left Arm)   Pulse 64   Temp 97.8 F (36.6 C) (Oral)   Resp 19   Ht 5' 6 (1.676 m)   Wt 68.8 kg   SpO2 97%   BMI 24.48 kg/m  SpO2: SpO2: 97 % O2 Device: O2 Device: High Flow Nasal Cannula O2 Flow Rate: O2 Flow Rate (L/min): 25 L/min  Intake/output summary:  Intake/Output Summary (Last 24 hours) at 03/03/2024 1343 Last data filed at 03/03/2024 1148 Gross per 24 hour  Intake 236 ml  Output 1100 ml  Net -864 ml   LBM: Last BM Date : 03/03/24 Baseline Weight: Weight: 68.9 kg Most recent weight: Weight: 68.8 kg       Palliative Assessment/Data: 50%      Patient Active Problem List   Diagnosis Date Noted   Respiratory distress 02/25/2024   CAP (community acquired pneumonia) 02/20/2024   Leukocytosis 02/20/2024   AKI (acute kidney injury) (HCC) 02/20/2024   Normocytic anemia 02/20/2024   Controlled type 2 diabetes mellitus without complication, without long-term  current use of insulin  (HCC) 02/20/2024   Pulmonary embolism (HCC) 02/19/2024   Mixed hyperlipidemia 11/20/2022   Precordial pain 11/20/2022   Anxiety and depression 07/24/2021   Acquired hypothyroidism 07/24/2021   History of gastroesophageal reflux (GERD) 07/24/2021   History of vitamin D deficiency 07/24/2021   History of diabetes mellitus, type II 07/24/2021   Essential hypertension 07/24/2021   History of asthma 07/24/2021   Hypertriglyceridemia 06/01/2021   Coronary artery disease involving native coronary artery of native heart without angina pectoris 05/31/2021   IPF (idiopathic pulmonary fibrosis) (HCC) 05/30/2020    Palliative Care Assessment & Plan   Patient Profile: 78 y.o. female  with past medical history of pulmonary embolism, idiopathic pulmonary fibrosis, community-acquired pneumonia, acute kidney injury, essential hypertension, diabetes mellitus type 2, acquired hypothyroidism,  admitted on 02/25/2024 with respiratory distress and shortness of breath.   Assessment: The patient is receiving supplemental oxygen at 30 LPM, 70% FiO2, and is maintaining oxygen saturation at 96-97%. She experienced a coughing episode this morning that necessitated an increase in oxygen support. She continues to report weakness and has a suboptimal appetite. Her husband is present at the bedside and is very supportive. The patient has expressed a desire to update her HCPOA once her condition improves.  Recommendations/Plan: Code Status: DNR, DNI Goals of care unchanged, continue to treat the treatable.  Symptom Management: (Per attending) Continue with O2 supplement, wean down as tolerated. Continue with bronchodilators Cough medicine (Mucinex ) for symptom relief Tylenol  PRN for pain Continue with breathing excercises with IS and flutter valve Desires assistance with updating HCPOA once she feels better.  Continue with palliative holistic support.   Goals of Care and Additional  Recommendations: Supportive care, continue to treat the treatable.   Code Status:    Code Status Orders  (From admission, onward)  Start     Ordered   02/28/24 1531  Do not attempt resuscitation (DNR)- Limited -Do Not Intubate (DNI)  (Code Status)  Continuous       Question Answer Comment  If pulseless and not breathing No CPR or chest compressions.   In Pre-Arrest Conditions (Patient Is Breathing and Has A Pulse) Do not intubate. Provide all appropriate non-invasive medical interventions. Avoid ICU transfer unless indicated or required.   Consent: Discussion documented in EHR or advanced directives reviewed      02/28/24 1530           Code Status History     Date Active Date Inactive Code Status Order ID Comments User Context   02/25/2024 1738 02/28/2024 1530 Full Code [id number]  Tobie Mario GAILS, MD ED   02/20/2024 0927 02/23/2024 1720 Full Code [id number]  Claudene Maximino LABOR, MD Inpatient       Prognosis: The prognosis is relatively limited/poor due to the progressive nature of her idiopathic pulmonary fibrosis, limited lung reserved, compounded by her other comorbidities.   Discharge Planning: SNF with Rehab  Care plan was discussed with Patient, husband, and treatment team.   Time Spent: 35 minutes  Thank you for allowing the Palliative Medicine Team to assist in the care of this patient.   Kathlyne JULIANNA Tracie Mickey, NP  Please contact Palliative Medicine Team phone at [phone] for questions and concerns.

## 2024-03-03 NOTE — Progress Notes (Signed)
 Progress Note  Kathryn Scott  FMW:[id number] DOB: April 03, 1946  DOA: 02/25/2024 PCP: Carvin [name], MD  Brief Narrative:  Kathryn Scott is a 78 y.o. female with past medical history of pulmonary embolism, idiopathic pulmonary fibrosis, community-acquired pneumonia, acute kidney injury, essential hypertension, diabetes mellitus type 2, acquired hypothyroidism presented for evaluation of respiratory distress. She was recently admitted on the 17th and discharged on the 21st found to have a pulmonary embolism. Patient was admitted for respiratory failure, interstitial pulmonary fibrosis, presumed pneumonia  Continues to show very slow and gradual improvement. Palliative and pulmonology are consulted.   Assessment/Plan:   Principal Problem:   Respiratory distress Active Problems:   Pulmonary embolism (HCC)   IPF (idiopathic pulmonary fibrosis) (HCC)  Body mass index is 24.48 kg/m.  Acute on chronic hypoxic respiratory failure Recurrent pneumonia Interstitial pulmonary fibrosis with acute exacerbation: Weaned off bipap 7/27. Now on 30L 60% HHFNC and desats with minimal exertion or sleeping occasionally (not using her prescribed cpap). Was able to work with PT a bit today. MRSA negative. Procal low. Urine strep pneumo ag negative. RVP negative. She was recently discharged on 3L with rest, 4L with activity. - Continue IV cefepime  x7 days (ending 7/29), and IV Lasix . - Solu-Medrol  course completed, continues on prednisone  Continue bronchodilators Follow-up with pulmonologist.   Pulmonary embolism: Recent diagnosis of PE on 02/19/2024 - IV heparin  being converted back to eliquis    Pulmonary hypertension: Due to PE.   Continue IV Lasix .  Ofev  on hold for now. 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 Creatinine is stable  close to baseline.  1.39 today Monitor BMP Avoid nephrotoxic drugs.   Type 2 diabetes mellitus: NovoLog  as needed for hyperglycemia as well as added long acting as more hyperglycemic with higher steroid doses   Hypomagnesemia- resolved with correction   Hypothyroidism: Continue home dose Synthroid .    Anxiety S/p treatment with IV Ativan  0.5 mg on 02/28/2024. Continue sertraline    Palliative care has been consulted because of concern for poor prognosis. She and family confirm DNR/DNI  Consultants: Pulmonologist Palliative   Procedures: None  Family Communication/Anticipated D/C date and plan/Code Status   DVT prophylaxis: heparin  gttapixaban (ELIQUIS ) tablet 5 mg > eliquis      Code Status: Limited: Do not attempt resuscitation (DNR) -DNR-LIMITED -Do Not Intubate/DNI   Family Communication: None.  Disposition Plan: To be determined  Status is: Inpatient Remains inpatient appropriate because: Acute hypoxic respiratory failure, pneumonia  Subjective:   Pt reports feeling ok. She is short of breath after just getting on and off the commode and back to bed. It really took a lot out of her.   Objective:    Vitals:   03/02/24 2003 03/03/24 0007 03/03/24 0117 03/03/24 0439  BP: (!) 133/58 119/63  135/71  Pulse: 65 (!) 55 (!) 59 62  Resp: 19 20 20 19   Temp: 97.9 F (36.6 C) 98 F (36.7 C)  97.9 F (36.6 C)  TempSrc: Oral Oral  Oral  SpO2: 91% 91%  93%  Weight:    68.8 kg  Height:       No data found.   Intake/Output Summary (Last 24 hours) at 03/03/2024 0737 Last data filed at 03/03/2024 0441 Gross per 24 hour  Intake 239 ml  Output 450 ml  Net -211 ml   Filed Weights   02/29/24 0431 03/01/24 0433 03/03/24  0439  Weight: 68.5 kg 68.9 kg 68.8 kg   Exam: GEN: NAD, she appears comfortable SKIN: Warm and dry EYES: No pallor or icterus ENT: MMM CV: RRR PULM: Bilateral rhonchi, bibasilar rales. Speaking in full sentences ABD: soft, ND, NT, +BS CNS: AAO x  3, non focal EXT: No edema or tenderness  Data Reviewed:   Basic Metabolic Panel: Recent Labs  Lab 02/26/24 1159 02/27/24 0459 02/28/24 0208 02/29/24 0505 03/01/24 0501 03/02/24 0438 03/03/24 0436  NA  --  137 137 140 138 143 139  K  --  4.2 4.4 4.2 3.8 3.0* 4.0  CL  --  103 105 106 106 107 108  CO2  --  22 19* 18* 22 25 23   GLUCOSE  --  212* 117* 216* 253* 82 99  BUN  --  35* 40* 46* 51* 56* 55*  CREATININE  --  1.46* 1.30* 1.36* 1.27* 1.39* 1.22*  CALCIUM   --  8.6* 8.6* 8.9 8.8* 9.1 9.1  MG 1.8 2.8* 2.1 2.0 2.1  --   --    GFR Estimated Creatinine Clearance: 37.3 mL/min (A) (by C-G formula based on SCr of 1.22 mg/dL (H)).  CBC: Recent Labs  Lab 02/25/24 1343 02/25/24 1405 02/27/24 0459 02/28/24 0208 02/29/24 0804 03/01/24 0501 03/02/24 0438  WBC 15.3*   < > 18.6* 17.8* 15.6* 16.1* 17.3*  NEUTROABS 13.0*  --   --   --   --   --   --   HGB 10.3*   < > 8.8* 8.9* 9.0* 8.4* 9.0*  HCT 32.9*   < > 26.8* 28.0* 27.7* 25.5* 27.3*  MCV 95.4   < > 92.1 91.8 91.1 90.1 89.5  PLT 484*   < > 465* 571* 440* 429* 493*   < > = values in this interval not displayed.   Procedures and diagnostic studies:  No results found.   LOS: 7 days   Sidi Dzikowski L [employer] on [email or web address] If 7PM-7AM, please contact night-coverage at [email or web address]  03/03/2024, 7:37 AM

## 2024-03-04 DIAGNOSIS — E43 Unspecified severe protein-calorie malnutrition: Secondary | ICD-10-CM | POA: Insufficient documentation

## 2024-03-04 DIAGNOSIS — I2694 Multiple subsegmental pulmonary emboli without acute cor pulmonale: Secondary | ICD-10-CM | POA: Diagnosis not present

## 2024-03-04 DIAGNOSIS — J189 Pneumonia, unspecified organism: Secondary | ICD-10-CM | POA: Diagnosis not present

## 2024-03-04 DIAGNOSIS — J90 Pleural effusion, not elsewhere classified: Secondary | ICD-10-CM | POA: Diagnosis not present

## 2024-03-04 DIAGNOSIS — J84112 Idiopathic pulmonary fibrosis: Secondary | ICD-10-CM | POA: Diagnosis not present

## 2024-03-04 DIAGNOSIS — J9621 Acute and chronic respiratory failure with hypoxia: Secondary | ICD-10-CM | POA: Diagnosis not present

## 2024-03-04 DIAGNOSIS — I2699 Other pulmonary embolism without acute cor pulmonale: Secondary | ICD-10-CM | POA: Diagnosis not present

## 2024-03-04 LAB — GLUCOSE, CAPILLARY
Glucose-Capillary: 121 mg/dL — ABNORMAL HIGH (ref 70–99)
Glucose-Capillary: 137 mg/dL — ABNORMAL HIGH (ref 70–99)
Glucose-Capillary: 159 mg/dL — ABNORMAL HIGH (ref 70–99)
Glucose-Capillary: 207 mg/dL — ABNORMAL HIGH (ref 70–99)
Glucose-Capillary: 135 mg/dL — ABNORMAL HIGH (ref 70–99)

## 2024-03-04 MED ORDER — HYDROXYZINE HCL 25 MG PO TABS
25.0000 mg | ORAL_TABLET | Freq: Once | ORAL | Status: AC
  Administered 2024-03-04: 25 mg via ORAL
  Filled 2024-03-04: qty 1

## 2024-03-04 MED ORDER — ENSURE PLUS HIGH PROTEIN PO LIQD
237.0000 mL | Freq: Three times a day (TID) | ORAL | Status: DC
  Administered 2024-03-04 – 2024-03-06 (×6): 237 mL via ORAL

## 2024-03-04 MED ORDER — ENSURE PLUS HIGH PROTEIN PO LIQD
237.0000 mL | Freq: Two times a day (BID) | ORAL | Status: DC
  Administered 2024-03-04: 237 mL via ORAL

## 2024-03-04 MED ORDER — ADULT MULTIVITAMIN W/MINERALS CH
1.0000 | ORAL_TABLET | Freq: Every day | ORAL | Status: DC
  Administered 2024-03-04 – 2024-03-06 (×3): 1 via ORAL
  Filled 2024-03-04 (×3): qty 1

## 2024-03-04 NOTE — Plan of Care (Signed)

## 2024-03-04 NOTE — Progress Notes (Signed)
   NAME:  Kathryn Scott, MRN:  [id number], DOB:  1946/01/17, LOS: 8 ADMISSION DATE:  02/25/2024, CONSULTATION DATE: 02/25/2024 REFERRING MD: CHARLENA Blanch, MD, CHIEF COMPLAINT: Acute hypoxic respiratory failure  History of Present Illness:   The patient, with idiopathic pulmonary fibrosis, presents with worsening respiratory symptoms following a recent hospitalization for pneumonia and pulmonary embolism. She is accompanied by her daughter, Rojelio.  Dyspnea and hypoxemia - Worsening shortness of breath following recent hospitalization for pneumonia and pulmonary embolism - Oxygen saturation drops to 70-71% during activities such as talking or going to the restroom, even when oxygen concentrator increased to five liters - Currently requiring 18 liters of oxygen - Discharged from hospital on Monday with instructions for oxygen therapy at three liters at rest and four liters during activity  Cough and hemoptysis - Cough with bloody mucus developed this morning  Fever - Fever developed this morning to 101  Recent hospitalization and cardiac findings - Admitted last Wednesday morning and discharged on Monday - Diagnosed with pneumonia, small pulmonary embolism, and suspected atrial fibrillation during hospitalization - Echocardiogram performed during last hospital visit showed moderate pulmonary HTN - Previous cardiac ultrasound in January of 2025 did not show any evidence of pulmonary hypertension  Idiopathic pulmonary fibrosis management - Receiving treatment with Ofev , taken consistently at home  Anticoagulation therapy - On Eliquis  for small pulmonary embolism, started at last admission  Peripheral edema - No swelling in feet  Pertinent  Medical History    has a past medical history of Asthma, Diabetes mellitus without complication (HCC), and Hypertension.   Significant Hospital Events: Including procedures, antibiotic start and stop dates in addition to other pertinent  events   7/23 pccm consult for Aoc hypoxia ?ILD flare  7/26 Worsening respiratory failure overnight requiring heated high flow nasal cannula and transition to BiPAP for desats. Given Ativan  for anxiety. Chest x-ray with worsening bilateral opacities 7/28 oxygen requirement improving 7/30 remains on high flow oxygen  Interim History / Subjective:   Remains on high flow nasal cannula, looks frail, tired  Objective    Blood pressure 123/63, pulse 68, temperature 97.9 F (36.6 C), temperature source Oral, resp. rate 18, height 5' 6 (1.676 m), weight 67.5 kg, SpO2 94%.    FiO2 (%):  [70 %-85 %] 80 %   Intake/Output Summary (Last 24 hours) at 03/04/2024 1432 Last data filed at 03/04/2024 0410 Gross per 24 hour  Intake 550 ml  Output 1500 ml  Net -950 ml   Filed Weights   03/01/24 0433 03/03/24 0439 03/04/24 0407  Weight: 68.9 kg 68.8 kg 67.5 kg   General: Chronically ill-appearing, HEENT: Moist oral mucosa Neuro: Alert and oriented x 3 CV: S1-S2 appreciated PULM: Does have bilateral rales GI: soft, non-tender  Extremities: Skin is warm and dry    CTA 02/19/2024-segmental pulmonary embolism in the right upper lobe, new airspace consolidation, groundglass opacities in the right upper lobe, small right pleural effusion  CTA 02/24/2021-negative for pulmonary embolism.  Worsening diffuse bilateral heterogeneous groundglass opacities, mild adenopathy.  Echocardiogram 02/20/2024-LVEF 65-70%.  RV systolic function is normal, moderate elevation in PA systolic pressure  Resolved problem list   Assessment and Plan   Acute on chronic hypoxic respiratory failure Community-acquired pneumonia Subsegmental PE in the right upper lobe Pulmonary hypertension Small pleural effusion Deconditioning  Continue prednisone  at 40 Did receive 3 days of high-dose steroids IV Continue Eliquis  for anticoagulation Has completed course of antibiotics  Will follow

## 2024-03-04 NOTE — Progress Notes (Signed)
 Progress Note   Patient: Kathryn Scott FMW:[id number] DOB: [birth year] DOA: 02/25/2024  DOS: the patient was seen and examined on 03/04/2024   Brief hospital course:  78 y.o. female with past medical history of pulmonary embolism, idiopathic pulmonary fibrosis, community-acquired pneumonia, acute kidney injury, essential hypertension, diabetes mellitus type 2, acquired hypothyroidism presented for evaluation of respiratory distress. She was recently admitted on the 17th and discharged on the 21st found to have a pulmonary embolism. Patient was admitted for respiratory failure, interstitial pulmonary fibrosis, presumed pneumonia  Continues to show very slow and gradual improvement. Palliative and pulmonology are consulted.   Assessment and Plan:  Acute on chronic hypoxic respiratory failure - Continues on Optiflow/high flow at 45/80%.  Appears slightly worse than yesterday however patient satting well upper 90s.  Continue to attempt to wean O2 as tolerated.  Was recently discharged on 3-4 L.  Acute exacerbation of idiopathic pulmonary fibrosis 2/2 recurrent pneumonia - O2 as above.  Completed 7 days IV cefepime  7/29.  IV Solu-Medrol  transition to p.o. prednisone .  Bronchodilators on board.  Pulmonology following closely.  Acute pulmonary embolism - Diagnosed at previous visit.  Eliquis  on board.  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  Will recheck BMP in AM.  Diabetes mellitus - Insulin  glargine on board.  Likely exacerbated by corticosteroid use.  Insulin  sliding scale.  Hypomagnesemia - Resolved after correction.  Hypothyroid - Synthroid  on board  Anxiety - Sertraline  on board.  Goals of care - Palliative care consulted secondary to poor prognosis.  Currently DNR/DNI.    Subjective: Patient resting comfortably in bed, her daughter Santana at her side in bed.  Husband they are as well.  Patient currently admits to intermittent dry cough  with white-sputum but denies fever, worsening shortness of breath, chest pain, nausea, vomiting, abdominal pain.  Physical Exam:  Vitals:   03/04/24 0742 03/04/24 0815 03/04/24 0817 03/04/24 1203  BP: 131/69   123/63  Pulse: 74   68  Resp: 19  20 18   Temp: 98.4 F (36.9 C)   97.9 F (36.6 C)  TempSrc: Oral   Oral  SpO2: 93% 95%  94%  Weight:      Height:        GENERAL:  Alert, pleasant, no acute distress  HEENT:  EOMI, Optiflow CARDIOVASCULAR:  RRR, no murmurs appreciated RESPIRATORY: Poor air movement bilaterally, mild crackles GASTROINTESTINAL:  Soft, nontender, nondistended EXTREMITIES:  No LE edema bilaterally NEURO:  No new focal deficits appreciated SKIN:  No rashes noted PSYCH:  Appropriate mood and affect     Data Reviewed:  Imaging Studies: DG CHEST PORT 1 VIEW Result Date: 02/28/2024 CLINICAL DATA:  [id number]. Acute on chronic respiratory failure with hypoxia. EXAM: PORTABLE CHEST 1 VIEW COMPARISON:  Portable chest 02/25/2024, CTA chest 02/25/2024. FINDINGS: 3:34 a.m. There is background widespread interstitial coarsening consistent with subpleural fibrosis. Superimposed on this is worsening patchy dense bilateral airspace disease consistent with edema, pneumonia or combination. There are small pleural effusions which appear similar. The mediastinum is stable with aortic atherosclerosis. There is mild cardiomegaly. Central vessels are not well seen due to airspace disease. No new osseous finding. IMPRESSION: 1. Worsening patchy dense bilateral airspace disease consistent with edema, pneumonia or combination. Background chronic interstitial lung disease. 2. Small pleural effusions. 3. Aortic atherosclerosis. 4. Mild cardiomegaly. Electronically Signed   By: Francis Quam M.D.   On: 02/28/2024 03:52   CT Angio Chest PE W and/or Wo Contrast  Result Date: 02/25/2024 CLINICAL DATA:  Follow-up PE, increased oxygen demand EXAM: CT ANGIOGRAPHY CHEST WITH CONTRAST TECHNIQUE:  Multidetector CT imaging of the chest was performed using the standard protocol during bolus administration of intravenous contrast. Multiplanar CT image reconstructions and MIPs were obtained to evaluate the vascular anatomy. RADIATION DOSE REDUCTION: This exam was performed according to the departmental dose-optimization program which includes automated exposure control, adjustment of the mA and/or kV according to patient size and/or use of iterative reconstruction technique. CONTRAST:  60mL OMNIPAQUE  IOHEXOL  350 MG/ML SOLN COMPARISON:  Chest x-ray 02/25/2024, chest CT 02/19/2024, CT chest 07/25/2023 FINDINGS: Cardiovascular: Satisfactory opacification of the pulmonary arteries to the segmental level. No evidence of pulmonary embolism. Previously noted small right upper lobe embolus is no longer visualized. Moderate aortic atherosclerosis. No aneurysm or dissection. Borderline to mild cardiomegaly. No pericardial effusion Mediastinum/Nodes: Patent trachea. No thyroid mass. Prominent right paratracheal node measuring up to 14 mm. AP window nodes measuring up to 9 mm. Right hilar nodes measuring up to 17 mm. Esophagus within normal limits. Lungs/Pleura: Underlying chronic lung disease and fibrosis. Increased small left and small moderate right pleural effusions compared to prior. Worsened diffuse bilateral heterogeneous ground-glass airspace disease throughout the lungs. No pneumothorax. Upper Abdomen: No acute finding Musculoskeletal: No acute osseous abnormality. Review of the MIP images confirms the above findings. IMPRESSION: 1. Negative for acute pulmonary embolus. Previously noted small right upper lobe embolus is no longer visualized. 2. Underlying chronic lung disease and fibrosis. Worsened diffuse bilateral heterogeneous ground-glass airspace disease throughout the lungs, suspect for worsened edema or infection. Increased small left and small to moderate right pleural effusions. 3. Mild mediastinal and  right hilar adenopathy, likely reactive, slightly increased. 4. Aortic atherosclerosis. Aortic Atherosclerosis (ICD10-I70.0). Electronically Signed   By: Luke Bun M.D.   On: 02/25/2024 16:03   DG Chest Port 1 View Result Date: 02/25/2024 CLINICAL DATA:  Questionable sepsis-evaluate for abnormality. EXAM: PORTABLE CHEST 1 VIEW COMPARISON:  Radiographs 02/19/2024 and 08/23/2021. CT 02/19/2024 and 07/25/2023. FINDINGS: 1418 hours. The heart size and mediastinal contours are stable with aortic atherosclerosis. Background diffuse interstitial thickening characterized as probable UIP on previous high-resolution chest CT. Compared with the recent prior studies, there is increased diffuse interstitial thickening, suspicious for edema or atypical infection. The focal airspace disease inferiorly in the right upper lobe has partially cleared. No evidence of pneumothorax or significant pleural effusion. No acute osseous findings. Telemetry leads overlie the chest. IMPRESSION: 1. Increased diffuse interstitial thickening suspicious for edema or atypical infection superimposed on chronic interstitial lung disease. Continued follow-up recommended. 2. Partial clearing of focal airspace disease inferiorly in the right upper lobe. Electronically Signed   By: Elsie Perone M.D.   On: 02/25/2024 14:33   VAS US  LOWER EXTREMITY VENOUS (DVT) Result Date: 02/21/2024  Lower Venous DVT Study Patient Name:  KELLEEN STOLZE  Date of Exam:   02/20/2024 Medical Rec #: [id number]       Accession #:    [id number] Date of Birth: 1946-01-30      Patient Gender: F Patient Age:   54 years Exam Location:  [facility] Procedure:      VAS US  LOWER EXTREMITY VENOUS (DVT) Referring Phys: MICAELA SPEAKER --------------------------------------------------------------------------------  Indications: Swelling, and Edema.  Performing Technologist: Elmarie Lindau, RVT  Examination Guidelines: A complete evaluation includes B-mode imaging,  spectral Doppler, color Doppler, and power Doppler as needed of all accessible portions of each vessel. Bilateral testing is considered an integral part of a complete  examination. Limited examinations for reoccurring indications may be performed as noted. The reflux portion of the exam is performed with the patient in reverse Trendelenburg.  +---------+---------------+---------+-----------+----------+--------------+ RIGHT    CompressibilityPhasicitySpontaneityPropertiesThrombus Aging +---------+---------------+---------+-----------+----------+--------------+ CFV      Full           Yes      Yes                                 +---------+---------------+---------+-----------+----------+--------------+ SFJ      Full                                                        +---------+---------------+---------+-----------+----------+--------------+ FV Prox  Full                                                        +---------+---------------+---------+-----------+----------+--------------+ FV Mid   Full                                                        +---------+---------------+---------+-----------+----------+--------------+ FV DistalFull                                                        +---------+---------------+---------+-----------+----------+--------------+ PFV      Full                                                        +---------+---------------+---------+-----------+----------+--------------+ POP      Full           Yes      Yes                                 +---------+---------------+---------+-----------+----------+--------------+ PTV      Full                                                        +---------+---------------+---------+-----------+----------+--------------+ PERO     Full                                                        +---------+---------------+---------+-----------+----------+--------------+    +---------+---------------+---------+-----------+----------+--------------+ LEFT     CompressibilityPhasicitySpontaneityPropertiesThrombus Aging +---------+---------------+---------+-----------+----------+--------------+ CFV      Full  Yes      Yes                                 +---------+---------------+---------+-----------+----------+--------------+ SFJ      Full                                                        +---------+---------------+---------+-----------+----------+--------------+ FV Prox  Full                                                        +---------+---------------+---------+-----------+----------+--------------+ FV Mid   Full                                                        +---------+---------------+---------+-----------+----------+--------------+ FV DistalFull                                                        +---------+---------------+---------+-----------+----------+--------------+ PFV      Full                                                        +---------+---------------+---------+-----------+----------+--------------+ POP      Full           Yes      Yes                                 +---------+---------------+---------+-----------+----------+--------------+ PTV      Full                                                        +---------+---------------+---------+-----------+----------+--------------+ PERO     Full                                                        +---------+---------------+---------+-----------+----------+--------------+     Summary: RIGHT: - There is no evidence of deep vein thrombosis in the lower extremity.  - No cystic structure found in the popliteal fossa.  LEFT: - There is no evidence of deep vein thrombosis in the lower extremity.  - No cystic structure found in the popliteal fossa.  *See table(s) above for measurements and observations. Electronically signed  by Fonda Rim on 02/21/2024 at 9:55:35 AM.  Final    ECHOCARDIOGRAM COMPLETE Result Date: 02/20/2024    ECHOCARDIOGRAM REPORT   Patient Name:   RONELLE MICHIE Date of Exam: 02/20/2024 Medical Rec #:  [id number]      Height:       66.0 in Accession #:    [id number]     Weight:       156.1 lb Date of Birth:  04-13-1946     BSA:          1.800 m Patient Age:    77 years       BP:           127/61 mmHg Patient Gender: F              HR:           62 bpm. Exam Location:  Inpatient Procedure: 2D Echo (Both Spectral and Color Flow Doppler were utilized during            procedure). Indications:    pulmonary embolus  History:        Patient has prior history of Echocardiogram examinations, most                 recent 08/25/2023. CAD; Risk Factors:Hypertension, Dyslipidemia                 and Diabetes.  Sonographer:    Tinnie Barefoot RDCS Referring Phys: [id number] SUBRINA SUNDIL IMPRESSIONS  1. Prominent moderator band in RV (images 35, 51). Left ventricular ejection fraction, by estimation, is 65 to 70%. The left ventricle has normal function. The left ventricle has no regional wall motion abnormalities.  2. Right ventricular systolic function is normal. The right ventricular size is normal. There is moderately elevated pulmonary artery systolic pressure.  3. The mitral valve is normal in structure. Trivial mitral valve regurgitation.  4. The aortic valve is tricuspid. Aortic valve regurgitation is trivial.  5. The inferior vena cava is normal in size with greater than 50% respiratory variability, suggesting right atrial pressure of 3 mmHg. Comparison(s): The left ventricular function is unchanged. FINDINGS  Left Ventricle: Prominent moderator band in RV (images 35, 51). Left ventricular ejection fraction, by estimation, is 65 to 70%. The left ventricle has normal function. The left ventricle has no regional wall motion abnormalities. The left ventricular internal cavity size was normal in size. There is no left  ventricular hypertrophy. Right Ventricle: The right ventricular size is normal. Right vetricular wall thickness was not assessed. Right ventricular systolic function is normal. There is moderately elevated pulmonary artery systolic pressure. The tricuspid regurgitant velocity is  3.51 m/s, and with an assumed right atrial pressure of 3 mmHg, the estimated right ventricular systolic pressure is 52.3 mmHg. Left Atrium: Left atrial size was normal in size. Right Atrium: Right atrial size was normal in size. Pericardium: There is no evidence of pericardial effusion. Mitral Valve: The mitral valve is normal in structure. Trivial mitral valve regurgitation. Tricuspid Valve: The tricuspid valve is normal in structure. Tricuspid valve regurgitation is trivial. Aortic Valve: The aortic valve is tricuspid. Aortic valve regurgitation is trivial. Pulmonic Valve: The pulmonic valve was normal in structure. Pulmonic valve regurgitation is mild. Aorta: The aortic root and ascending aorta are structurally normal, with no evidence of dilitation. Venous: The inferior vena cava is normal in size with greater than 50% respiratory variability, suggesting right atrial pressure of 3 mmHg. IAS/Shunts: No atrial level shunt detected by color flow Doppler. Additional Comments: A device lead is visualized.  LEFT VENTRICLE PLAX 2D LVIDd:         4.70 cm   Diastology LVIDs:         2.80 cm   LV e' medial:    11.80 cm/s LV PW:         1.00 cm   LV E/e' medial:  8.1 LV IVS:        0.90 cm   LV e' lateral:   10.70 cm/s LVOT diam:     1.80 cm   LV E/e' lateral: 9.0 LV SV:         54 LV SV Index:   30 LVOT Area:     2.54 cm  RIGHT VENTRICLE             IVC RV Basal diam:  2.80 cm     IVC diam: 2.00 cm RV S prime:     16.60 cm/s TAPSE (M-mode): 2.3 cm LEFT ATRIUM             Index        RIGHT ATRIUM           Index LA diam:        3.80 cm 2.11 cm/m   RA Area:     14.80 cm LA Vol (A2C):   41.6 ml 23.11 ml/m  RA Volume:   38.80 ml  21.56 ml/m LA  Vol (A4C):   45.9 ml 25.50 ml/m LA Biplane Vol: 47.0 ml 26.11 ml/m  AORTIC VALVE             PULMONIC VALVE LVOT Vmax:   91.50 cm/s  PR End Diast Vel: 6.97 msec LVOT Vmean:  60.500 cm/s LVOT VTI:    0.212 m  AORTA Ao Root diam: 2.80 cm Ao Asc diam:  2.80 cm MITRAL VALVE               TRICUSPID VALVE MV Area (PHT): 3.85 cm    TR Peak grad:   49.3 mmHg MV Decel Time: 197 msec    TR Vmax:        351.00 cm/s MV E velocity: 96.00 cm/s MV A velocity: 89.20 cm/s  SHUNTS MV E/A ratio:  1.08        Systemic VTI:  0.21 m                            Systemic Diam: 1.80 cm Vina Gull MD Electronically signed by Vina Gull MD Signature Date/Time: 02/20/2024/8:14:36 PM    Final    CT Angio Chest PE W and/or Wo Contrast Result Date: 02/19/2024 CLINICAL DATA:  High probability for PE.  Shortness of breath. EXAM: CT ANGIOGRAPHY CHEST WITH CONTRAST TECHNIQUE: Multidetector CT imaging of the chest was performed using the standard protocol during bolus administration of intravenous contrast. Multiplanar CT image reconstructions and MIPs were obtained to evaluate the vascular anatomy. RADIATION DOSE REDUCTION: This exam was performed according to the departmental dose-optimization program which includes automated exposure control, adjustment of the mA and/or kV according to patient size and/or use of iterative reconstruction technique. CONTRAST:  60mL OMNIPAQUE  IOHEXOL  350 MG/ML SOLN COMPARISON:  CT of the chest 07/25/2023. FINDINGS: Cardiovascular: There is a segmental pulmonary embolism in the right upper lobe image 10/62. No other pulmonary emboli are seen. The heart is enlarged. Aorta is normal in size. There are atherosclerotic calcifications of the aorta. Mediastinum/Nodes: There are nonenlarged and mildly enlarged paratracheal lymph nodes measuring up to  11 mm. Visualized thyroid gland and esophagus are within normal limits. There is a small hiatal hernia. Lungs/Pleura: There is a small right pleural effusion. Peripheral  reticular opacities and honeycombing again seen throughout both lungs, progressed compared to 2024. There is new airspace consolidation and ground-glass opacities in the posterior and inferior right upper lobe. No pneumothorax visualized. Upper Abdomen: No acute abnormality. Musculoskeletal: No chest wall abnormality. No acute or significant osseous findings. Review of the MIP images confirms the above findings. IMPRESSION: 1. Segmental pulmonary embolism in the right upper lobe. No evidence for right heart strain. 2. New airspace consolidation and ground-glass opacities in the right upper lobe worrisome for pneumonia. 3. Small right pleural effusion. 4. Progressive interstitial lung disease. 5. Cardiomegaly. 6. Aortic atherosclerosis. Aortic Atherosclerosis (ICD10-I70.0). Electronically Signed   By: Greig Pique M.D.   On: 02/19/2024 21:04   DG Chest 2 View Result Date: 02/19/2024 EXAM: 2 VIEW(S) XRAY OF THE CHEST 02/19/2024 06:58:00 PM COMPARISON: Radiographs August 23, 2021 and CT chest July 25, 2023. CLINICAL HISTORY: CP. Pt has a hx of IP and asthma. Pt says she was carrying her grandson and might have pulled something. Pt reports CP and lower back pain that is progressively getting worse. Visible SOB that is more than usual per pt. FINDINGS: LUNGS AND PLEURA: Airspace opacity in the right mid lung suspicious for pneumonia. Follow up in 6-8 weeks after treatment is recommended to ensure resolution. Diffuse bronchial wall thickening and interstitial coarsening. No pleural effusion or pneumothorax. HEART AND MEDIASTINUM: Stable cardiomediastinal silhouette. Aortic atherosclerotic calcification. BONES AND SOFT TISSUES: No acute osseous abnormality. IMPRESSION: 1. Airspace opacity in the right mid lung suspicious for pneumonia. Follow-up in 8 weeks after treatment is recommended to ensure resolution. 2. Diffuse bronchial wall thickening and interstitial coarsening likely related to underlying  interstitial lung disease . Electronically signed by: Norman Gatlin MD 02/19/2024 07:32 PM EDT RP Workstation: [id number]    There are no new results to review at this time.  Previous records (including but not limited to H&P, progress notes, nursing notes, TOC management) were reviewed in assessment of this patient.  Labs: CBC: Recent Labs  Lab 02/27/24 0459 02/28/24 0208 02/29/24 0804 03/01/24 0501 03/02/24 0438  WBC 18.6* 17.8* 15.6* 16.1* 17.3*  HGB 8.8* 8.9* 9.0* 8.4* 9.0*  HCT 26.8* 28.0* 27.7* 25.5* 27.3*  MCV 92.1 91.8 91.1 90.1 89.5  PLT 465* 571* 440* 429* 493*   Basic Metabolic Panel: Recent Labs  Lab 02/27/24 0459 02/28/24 0208 02/29/24 0505 03/01/24 0501 03/02/24 0438 03/03/24 0436  NA 137 137 140 138 143 139  K 4.2 4.4 4.2 3.8 3.0* 4.0  CL 103 105 106 106 107 108  CO2 22 19* 18* 22 25 23   GLUCOSE 212* 117* 216* 253* 82 99  BUN 35* 40* 46* 51* 56* 55*  CREATININE 1.46* 1.30* 1.36* 1.27* 1.39* 1.22*  CALCIUM  8.6* 8.6* 8.9 8.8* 9.1 9.1  MG 2.8* 2.1 2.0 2.1  --   --    Liver Function Tests: No results for input(s): AST, ALT, ALKPHOS, BILITOT, PROT, ALBUMIN  in the last 168 hours. CBG: Recent Labs  Lab 03/03/24 2007 03/03/24 2353 03/04/24 0411 03/04/24 0740 03/04/24 1202  GLUCAP 155* 159* 121* 137* 159*    Scheduled Meds:  apixaban   5 mg Oral BID   carvedilol   6.25 mg Oral BID   feeding supplement  237 mL Oral TID BM   fluticasone  furoate-vilanterol  1 puff Inhalation Daily   furosemide   40 mg  Intravenous Daily   insulin  aspart  0-15 Units Subcutaneous Q4H   insulin  glargine-yfgn  8 Units Subcutaneous Daily   levothyroxine   75 mcg Oral Q0600   montelukast   10 mg Oral QHS   multivitamin with minerals  1 tablet Oral Daily   pantoprazole   40 mg Oral BID   predniSONE   40 mg Oral Q breakfast   rosuvastatin   40 mg Oral Daily   sertraline   100 mg Oral QHS   sodium bicarbonate   650 mg Oral BID   sodium chloride  flush  3 mL Intravenous  Q12H   Continuous Infusions: PRN Meds:.acetaminophen  **OR** acetaminophen , albuterol , melatonin, mouth rinse, sodium chloride  flush  Family Communication: Family at bedside  Disposition: Status is: Inpatient Remains inpatient appropriate because: Acute on chronic hypoxic respiratory failure     Time spent: 38 minutes  Length of inpatient stay: 8 days  Author: Carliss LELON Canales, DO 03/04/2024 2:08 PM  For on call review [email or web address].

## 2024-03-04 NOTE — Progress Notes (Signed)
 Initial Nutrition Assessment  DOCUMENTATION CODES:   Severe malnutrition in context of chronic illness  INTERVENTION:  -Continue carb modified menu, regular texture, thin liquids -Increased Ensure Plus High Protein to TID -Add Magic Cup BID -Add MVI w/ min -Discussed fortified foods -Encouraged adequate kcal/pro intake   NUTRITION DIAGNOSIS:   Severe Malnutrition related to poor appetite, chronic illness as evidenced by severe muscle depletion, severe fat depletion, percent weight loss.  GOAL:   Patient will meet greater than or equal to 90% of their needs   MONITOR:   PO intake, Skin, Supplement acceptance, Labs, Weight trends  REASON FOR ASSESSMENT:   Malnutrition Screening Tool  ASSESSMENT:   Hx pulmonary embolism, idiopathic pulmonary fibrosis, community-acquired pneumonia, acute kidney injury, essential hypertension, diabetes mellitus type 2, acquired hypothyroidism,  admitted with respiratory distress and shortness of breath.  Spoke to pt and husband in room. Pt denies n/v/c/d or chewing/swallowing difficulties. Last BM 7/30. Pt does endorses poor appetite; weight loss of 9.9 kg (12.8%) x 6 months, severe. Discussed importance of adequate kcal/pro intake for healing, maintaining function. Encouraged pt to increased PO intake as able. Discussed food fortification, ONS options. Pt does state she sometimes coughs with cold foods, but is willing to try Magic Cup. Increased Ensure Plus High Protein to TID, pt states she has been drinking those. Documented PO intake has decreased, most recent intakes are 0%. NFPE completed (see below), pt meets criteria for severe protein calorie malnutrition. Pt/husband deny additional questions/concerns at this time, will continue to monitor, RDN available prn.   Labs BG 99-170 BUN 55 Creat 1.22 Albumin  2.2 GFR 46 H/H 9.0/27.3 A1c 5.6  Medications  apixaban   5 mg Oral BID   carvedilol   6.25 mg Oral BID   feeding supplement  237 mL  Oral TID BM   fluticasone  furoate-vilanterol  1 puff Inhalation Daily   furosemide   40 mg Intravenous Daily   insulin  aspart  0-15 Units Subcutaneous Q4H   insulin  glargine-yfgn  8 Units Subcutaneous Daily   levothyroxine   75 mcg Oral Q0600   montelukast   10 mg Oral QHS   multivitamin with minerals  1 tablet Oral Daily   pantoprazole   40 mg Oral BID   predniSONE   40 mg Oral Q breakfast   rosuvastatin   40 mg Oral Daily   sertraline   100 mg Oral QHS   sodium bicarbonate   650 mg Oral BID   sodium chloride  flush  3 mL Intravenous Q12H     NUTRITION - FOCUSED PHYSICAL EXAM:  Flowsheet Row Most Recent Value  Orbital Region Moderate depletion  Upper Arm Region Severe depletion  Thoracic and Lumbar Region Severe depletion  Buccal Region Moderate depletion  Temple Region Moderate depletion  Clavicle Bone Region Moderate depletion  Clavicle and Acromion Bone Region Moderate depletion  Scapular Bone Region Severe depletion  Dorsal Hand Severe depletion  Patellar Region Severe depletion  Anterior Thigh Region Severe depletion  Posterior Calf Region Severe depletion  Edema (RD Assessment) None  Hair Reviewed  Eyes Reviewed  Mouth Reviewed  Skin Reviewed  Nails Reviewed    Diet Order:   Diet Order             Diet Carb Modified Fluid consistency: Thin; Room service appropriate? Yes  Diet effective now                   EDUCATION NEEDS:   Education needs have been addressed  Skin:  Skin Assessment: Reviewed RN Assessment  Last  BM:  7/31 type 7 large  Height:   Ht Readings from Last 1 Encounters:  02/25/24 5' 6 (1.676 m)    Weight:   Wt Readings from Last 1 Encounters:  03/04/24 67.5 kg    BMI:  Body mass index is 24.02 kg/m.  Estimated Nutritional Needs:   Kcal:  1500-1700 kcal  Protein:  70-80 g  Fluid:  >/=1.5L    Ofelia Podolski Daml-Budig, RDN, LDN Registered Dietitian Nutritionist RD Inpatient Contact Info in [place]

## 2024-03-04 NOTE — Hospital Course (Signed)
 78 y.o. female with past medical history of pulmonary embolism, idiopathic pulmonary fibrosis, community-acquired pneumonia, acute kidney injury, essential hypertension, diabetes mellitus type 2, acquired hypothyroidism presented for evaluation of respiratory distress. She was recently admitted on the 17th and discharged on the 21st found to have a pulmonary embolism. Patient was admitted for respiratory failure, interstitial pulmonary fibrosis, presumed pneumonia  Continues to show very slow and gradual improvement. Palliative and pulmonology are consulted.    Assessment and Plan:   Acute on chronic hypoxic respiratory failure - Continues on Optiflow/high flow at 45/80%.  Appears slightly worse than yesterday however patient satting well upper 90s.  Continue to attempt to wean O2 as tolerated.  Was recently discharged on 3-4 L.   Acute exacerbation of idiopathic pulmonary fibrosis 2/2 recurrent pneumonia - O2 as above.  Completed 7 days IV cefepime  7/29.  IV Solu-Medrol  transition to p.o. prednisone .  Bronchodilators on board.  Pulmonology following closely.   Acute pulmonary embolism - Diagnosed at previous visit.  Eliquis  on board.   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  Will recheck BMP in AM.   Diabetes mellitus - Insulin  glargine on board.  Likely exacerbated by corticosteroid use.  Insulin  sliding scale.   Hypomagnesemia - Resolved after correction.   Hypothyroid - Synthroid  on board   Anxiety - Sertraline  on board.   Goals of care - Palliative care consulted secondary to poor prognosis.  Currently DNR/DNI.

## 2024-03-04 NOTE — Progress Notes (Signed)
 Occupational Therapy Treatment Patient Details Name: Kathryn Scott MRN: [id number] DOB: Jan 12, 1946 Today's Date: 03/04/2024   History of present illness Pt is a 78 y.o. female admitted 7/23 for SOB and hypoxia to 70s with mobility. CT negative for PE, worsening bil opacities. Pt briefly requiring BiPAP 7/26, transitioned to [facility]. PMH includes: Recent admission 7/17-7/21 with SOB, PNA, and PE, d/c home with 3L O2, PE, asthma, pulmonary fibrosis, HTN, CAD, hypothyroidism, DM II.   OT comments  Educated and provided written handout on the 5 Ps of energy conservation. Pt sat up with assist more due to fatigue than weakness. Receptive to education. Reports that paying bills online with her laptop is fatiguing. Patient will benefit from continued inpatient follow up therapy, <3 hours/day       If plan is discharge home, recommend the following:  A little help with walking and/or transfers;Assistance with cooking/housework   Equipment Recommendations  BSC/3in1;Tub/shower seat    Recommendations for Other Services      Precautions / Restrictions Precautions Precautions: Fall Recall of Precautions/Restrictions: Intact Precaution/Restrictions Comments: HHFNC, FiO2 70%, 40L Restrictions Weight Bearing Restrictions Per Provider Order: No       Mobility Bed Mobility Overal bed mobility: Needs Assistance Bed Mobility: Supine to Sit, Sit to Supine     Supine to sit: Min assist, HOB elevated, Used rails Sit to supine: Min assist, HOB elevated        Transfers                   General transfer comment: deferred due to fatigue     Balance Overall balance assessment: Needs assistance   Sitting balance-Leahy Scale: Good                                     ADL either performed or assessed with clinical judgement   ADL                                         General ADL Comments: Pt with poor activity tolerance. Fatigues with  minimal excersion of sitting up.    Extremity/Trunk Assessment              Occupational psychologist Communication: Impaired Factors Affecting Communication: Hearing impaired   Cognition Arousal: Alert Behavior During Therapy: WFL for tasks assessed/performed Cognition: No apparent impairments                               Following commands: Intact        Cueing   Cueing Techniques: Verbal cues  Exercises      Shoulder Instructions       General Comments      Pertinent Vitals/ Pain       Pain Assessment Pain Assessment: No/denies pain  Home Living                                          Prior Functioning/Environment              Frequency  Min 2X/week  Progress Toward Goals  OT Goals(current goals can now be found in the care plan section)  Progress towards OT goals: Not progressing toward goals - comment  Acute Rehab OT Goals OT Goal Formulation: With patient Time For Goal Achievement: 03/11/24 Potential to Achieve Goals: Fair  Plan      Co-evaluation                 AM-PAC OT 6 Clicks Daily Activity     Outcome Measure   Help from another person eating meals?: None Help from another person taking care of personal grooming?: A Little Help from another person toileting, which includes using toliet, bedpan, or urinal?: A Little Help from another person bathing (including washing, rinsing, drying)?: A Little Help from another person to put on and taking off regular upper body clothing?: A Little Help from another person to put on and taking off regular lower body clothing?: A Little 6 Click Score: 19    End of Session Equipment Utilized During Treatment: Oxygen  OT Visit Diagnosis: Other (comment) (decreased activity tolerance)   Activity Tolerance Patient limited by fatigue   Patient Left in bed;with family/visitor present;with call  bell/phone within reach   Nurse Communication          Time: 1445-1455 OT Time Calculation (min): 10 min  Charges: OT General Charges $OT Visit: 1 Visit OT Treatments $Therapeutic Activity: 8-22 mins  Mliss HERO, OTR/L Acute Rehabilitation Services Office: [phone]   Kennth Mliss Helling 03/04/2024, 3:22 PM

## 2024-03-04 NOTE — TOC Progression Note (Signed)
 Transition of Care [facility]) - Progression Note    Patient Details  Name: Kathryn Scott MRN: [id number] Date of Birth: 07/25/1946  [facility]) CM/SW Contact  Isaiah Public, LCSWA Phone Number: 03/04/2024, 11:19 AM  Clinical Narrative:     CSW following to fax patient out for SNF closer to patient being medically stable for dc. CSW will continue to follow and assist with patients dc planning needs.   Expected Discharge Plan: Skilled Nursing Facility Barriers to Discharge: Continued Medical Work up               Expected Discharge Plan and Services In-house Referral: Clinical Social Work Discharge Planning Services: CM Consult Post Acute Care Choice: Home Health, Resumption of Svcs/PTA Provider Living arrangements for the past 2 months: Single Family Home                           HH Arranged: RN, Disease Management, PT HH Agency:  ([facility]) Date HH Agency Contacted: 02/27/24 Time HH Agency Contacted: 1510 Representative spoke with at [facility] Agency: [facility]   Social Drivers of Health (SDOH) Interventions SDOH Screenings   Food Insecurity: No Food Insecurity (02/25/2024)  Housing: Low Risk  (02/25/2024)  Transportation Needs: No Transportation Needs (02/25/2024)  Utilities: Not At Risk (02/25/2024)  Financial Resource Strain: Medium Risk (09/13/2023)   Received from [facility]  Physical Activity: Unknown (09/13/2023)   Received from [facility]  Recent Concern: Physical Activity - Inactive (09/13/2023)   Received from [facility]  Social Connections: Moderately Integrated (02/25/2024)  Stress: Stress Concern Present (09/13/2023)   Received from [facility]  Tobacco Use: Medium Risk (02/26/2024)    Readmission Risk Interventions    02/27/2024    4:09 PM  Readmission Risk Prevention Plan  HRI or Home Care Consult Complete  Social Work Consult for Recovery Care Planning/Counseling Complete  Palliative Care Screening Not Applicable   Medication Review Oceanographer) Referral to Pharmacy

## 2024-03-04 NOTE — Plan of Care (Signed)
  Problem: Coping: Goal: Ability to adjust to condition or change in health will improve Outcome: Progressing   Problem: Clinical Measurements: Goal: Cardiovascular complication will be avoided Outcome: Progressing   Problem: Coping: Goal: Level of anxiety will decrease Outcome: Progressing   Problem: Safety: Goal: Ability to remain free from injury will improve Outcome: Progressing   Problem: Respiratory: Goal: Ability to maintain adequate ventilation will improve Outcome: Progressing Goal: Ability to maintain a clear airway will improve Outcome: Progressing

## 2024-03-04 NOTE — Progress Notes (Signed)
 OT Cancellation Note  Patient Details Name: Makayli Bracken MRN: [id number] DOB: 1946-07-09   Cancelled Treatment:    Reason Eval/Treat Not Completed: Fatigue/lethargy limiting ability to participate (Pt was sleeping and husband reported about how tired they were from the morning. Will reattempt when possible.)  Warrick POUR OTR/L  Acute Rehab Services  [phone] office number   Warrick Berber 03/04/2024, 11:56 AM

## 2024-03-05 DIAGNOSIS — J84112 Idiopathic pulmonary fibrosis: Secondary | ICD-10-CM | POA: Diagnosis not present

## 2024-03-05 DIAGNOSIS — N1831 Chronic kidney disease, stage 3a: Secondary | ICD-10-CM

## 2024-03-05 DIAGNOSIS — J9620 Acute and chronic respiratory failure, unspecified whether with hypoxia or hypercapnia: Secondary | ICD-10-CM | POA: Diagnosis not present

## 2024-03-05 DIAGNOSIS — J90 Pleural effusion, not elsewhere classified: Secondary | ICD-10-CM | POA: Diagnosis not present

## 2024-03-05 DIAGNOSIS — E43 Unspecified severe protein-calorie malnutrition: Secondary | ICD-10-CM

## 2024-03-05 DIAGNOSIS — I2699 Other pulmonary embolism without acute cor pulmonale: Secondary | ICD-10-CM | POA: Diagnosis not present

## 2024-03-05 DIAGNOSIS — I2694 Multiple subsegmental pulmonary emboli without acute cor pulmonale: Secondary | ICD-10-CM | POA: Diagnosis not present

## 2024-03-05 DIAGNOSIS — N179 Acute kidney failure, unspecified: Secondary | ICD-10-CM

## 2024-03-05 DIAGNOSIS — J9621 Acute and chronic respiratory failure with hypoxia: Secondary | ICD-10-CM | POA: Diagnosis not present

## 2024-03-05 DIAGNOSIS — J189 Pneumonia, unspecified organism: Secondary | ICD-10-CM | POA: Diagnosis not present

## 2024-03-05 LAB — CBC
HCT: 31.4 % — ABNORMAL LOW (ref 36.0–46.0)
Hemoglobin: 10 g/dL — ABNORMAL LOW (ref 12.0–15.0)
MCH: 29.1 pg (ref 26.0–34.0)
MCHC: 31.8 g/dL (ref 30.0–36.0)
MCV: 91.3 fL (ref 80.0–100.0)
Platelets: 367 K/uL (ref 150–400)
RBC: 3.44 MIL/uL — ABNORMAL LOW (ref 3.87–5.11)
RDW: 14 % (ref 11.5–15.5)
WBC: 17.5 K/uL — ABNORMAL HIGH (ref 4.0–10.5)
nRBC: 0 % (ref 0.0–0.2)

## 2024-03-05 LAB — BASIC METABOLIC PANEL WITH GFR
Anion gap: 12 (ref 5–15)
BUN: 51 mg/dL — ABNORMAL HIGH (ref 8–23)
CO2: 27 mmol/L (ref 22–32)
Calcium: 9.5 mg/dL (ref 8.9–10.3)
Chloride: 104 mmol/L (ref 98–111)
Creatinine, Ser: 1.08 mg/dL — ABNORMAL HIGH (ref 0.44–1.00)
GFR, Estimated: 54 mL/min — ABNORMAL LOW (ref >60)
Glucose, Bld: 138 mg/dL — ABNORMAL HIGH (ref 70–99)
Potassium: 3.3 mmol/L — ABNORMAL LOW (ref 3.5–5.1)
Sodium: 143 mmol/L (ref 135–145)

## 2024-03-05 LAB — MAGNESIUM: Magnesium: 1.9 mg/dL (ref 1.7–2.4)

## 2024-03-05 LAB — GLUCOSE, CAPILLARY
Glucose-Capillary: 130 mg/dL — ABNORMAL HIGH (ref 70–99)
Glucose-Capillary: 151 mg/dL — ABNORMAL HIGH (ref 70–99)
Glucose-Capillary: 161 mg/dL — ABNORMAL HIGH (ref 70–99)
Glucose-Capillary: 193 mg/dL — ABNORMAL HIGH (ref 70–99)
Glucose-Capillary: 221 mg/dL — ABNORMAL HIGH (ref 70–99)
Glucose-Capillary: 306 mg/dL — ABNORMAL HIGH (ref 70–99)

## 2024-03-05 NOTE — TOC Progression Note (Signed)
 Transition of Care [facility]) - Progression Note    Patient Details  Name: Kathryn Scott MRN: [id number] Date of Birth: 1946-02-01  Transition of Care [facility]) CM/SW Contact  Isaiah Public, LCSWA Phone Number: 03/05/2024, 11:26 AM  Clinical Narrative:     CSW following to fax patient out for SNF closer to patient being medically stable for dc. CSW will continue to follow and assist with patients dc planning needs.    Expected Discharge Plan: Skilled Nursing Facility Barriers to Discharge: Continued Medical Work up               Expected Discharge Plan and Services In-house Referral: Clinical Social Work Discharge Planning Services: CM Consult Post Acute Care Choice: Home Health, Resumption of Svcs/PTA Provider Living arrangements for the past 2 months: Single Family Home                           HH Arranged: RN, Disease Management, PT HH Agency:  ([facility]) Date HH Agency Contacted: 02/27/24 Time HH Agency Contacted: 1510 Representative spoke with at [facility] Agency: [facility]   Social Drivers of Health (SDOH) Interventions SDOH Screenings   Food Insecurity: No Food Insecurity (02/25/2024)  Housing: Low Risk  (02/25/2024)  Transportation Needs: No Transportation Needs (02/25/2024)  Utilities: Not At Risk (02/25/2024)  Financial Resource Strain: Medium Risk (09/13/2023)   Received from [facility]  Physical Activity: Unknown (09/13/2023)   Received from [facility]  Recent Concern: Physical Activity - Inactive (09/13/2023)   Received from [facility]  Social Connections: Moderately Integrated (02/25/2024)  Stress: Stress Concern Present (09/13/2023)   Received from [facility]  Tobacco Use: Medium Risk (02/26/2024)    Readmission Risk Interventions    02/27/2024    4:09 PM  Readmission Risk Prevention Plan  HRI or Home Care Consult Complete  Social Work Consult for Recovery Care Planning/Counseling Complete  Palliative Care Screening Not Applicable   Medication Review Oceanographer) Referral to Pharmacy

## 2024-03-05 NOTE — Progress Notes (Signed)
 Progress Note   Patient: Kathryn Scott FMW:[id number] DOB: 1945-12-17 DOA: 02/25/2024     9 DOS: the patient was seen and examined on 03/05/2024   Brief hospital course: 78 y.o. female with past medical history of pulmonary embolism, idiopathic pulmonary fibrosis, community-acquired pneumonia, acute kidney injury, essential hypertension, diabetes mellitus type 2, acquired hypothyroidism presented for evaluation of respiratory distress. She was recently admitted on the 17th and discharged on the 21st found to have a pulmonary embolism. Patient was admitted for respiratory failure, interstitial pulmonary fibrosis, presumed pneumonia  Continues to show very slow and gradual improvement. Palliative and pulmonology are on board. She is DNR/ limited.    Assessment and Plan:   Acute on chronic hypoxic respiratory failure Continues on Optiflow/high flow at 40/90%.  Saturations around 93%.  Continue to attempt to wean O2 as tolerated.  She was discharged on 3-4 L 2 weeks ago. Follow pulmonary recommendations..   Acute exacerbation of idiopathic pulmonary fibrosis 2/2 recurrent pneumonia Wean high flow O2. Completed 7 days IV cefepime  7/29.  IV Solu-Medrol  transitioned to p.o. prednisone .  Bronchodilators on board. Continue pulmonary toilet. Pulmonology following closely.  Leukocytosis: Likely in the setting of steroids. Trend daily CBC.   Acute pulmonary embolism Diagnosed at last visit.  Continue Eliquis  therapy.   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   Wound productive cough. Continue to monitor daily, renal function.   Diabetes mellitus Insulin  glargine on board.  Sugars exacerbated by corticosteroid use.  Continue Accu-Cheks, insulin  sliding scale.   Hypomagnesemia Mag 1.9.  Resolved after correction.   Hypothyroid Continue home dose Synthroid    Anxiety Sertraline  on board.   Goals of care Palliative care consulted, she has poor  prognosis.  Currently DNR/DNI.   Chaplain services for spiritual care, advanced directives.  She was asked to fill MOST form on Monday.     Out of bed to chair. Incentive spirometry. Nursing supportive care. Fall, aspiration precautions. Diet:  Diet Orders (From admission, onward)     Start     Ordered   02/25/24 1855  Diet Carb Modified Fluid consistency: Thin; Room service appropriate? Yes  Diet effective now       Question Answer Comment  Diet-HS Snack? Nothing   Calorie Level Medium 1600-2000   Fluid consistency: Thin   Room service appropriate? Yes      02/25/24 1854           DVT prophylaxis:  apixaban  (ELIQUIS ) tablet 5 mg  Level of care: Progressive   Code Status: Limited: Do not attempt resuscitation (DNR) -DNR-LIMITED -Do Not Intubate/DNI   Subjective: Patient is seen and examined today morning.  She is on high flow oxygen.  Did not get out of bed.  Eating poor.  Husband at bedside.  Physical Exam: Vitals:   03/05/24 0746 03/05/24 0839 03/05/24 1007 03/05/24 1244  BP: (!) 143/76   (!) 120/55  Pulse: 72   72  Resp: 20 19  (!) 21  Temp: 98.1 F (36.7 C)   97.9 F (36.6 C)  TempSrc: Oral   Oral  SpO2: 90% 93% 94% 92%  Weight:      Height:        General - Elderly Caucasian ill female, moderate respiratory distress HEENT - PERRLA, EOMI, atraumatic head, non tender sinuses. Lung - decreased, diffuse rales, rhonchi, wheezes. Heart - S1, S2 heard, no murmurs, rubs, trace pedal edema. Abdomen - Soft, non tender, bowel sounds good Neuro -  Alert, awake and oriented x 3, non focal exam. Skin - Warm and dry.  Data Reviewed:      Latest Ref Rng & Units 03/05/2024    4:56 AM 03/02/2024    4:38 AM 03/01/2024    5:01 AM  CBC  WBC 4.0 - 10.5 K/uL 17.5  17.3  16.1   Hemoglobin 12.0 - 15.0 g/dL 89.9  9.0  8.4   Hematocrit 36.0 - 46.0 % 31.4  27.3  25.5   Platelets 150 - 400 K/uL 367  493  429       Latest Ref Rng & Units 03/05/2024    4:56 AM 03/03/2024     4:36 AM 03/02/2024    4:38 AM  BMP  Glucose 70 - 99 mg/dL 861  99  82   BUN 8 - 23 mg/dL 51  55  56   Creatinine 0.44 - 1.00 mg/dL 8.91  8.77  8.60   Sodium 135 - 145 mmol/L 143  139  143   Potassium 3.5 - 5.1 mmol/L 3.3  4.0  3.0   Chloride 98 - 111 mmol/L 104  108  107   CO2 22 - 32 mmol/L 27  23  25    Calcium  8.9 - 10.3 mg/dL 9.5  9.1  9.1    No results found.  Family Communication: Discussed with patient and her husband at bedside, also daughter over phone. They understand and agree. All questions answered.  Disposition: Status is: Inpatient Remains inpatient appropriate because: severity of illness.  Planned Discharge Destination: Home with Home Health     Time spent: 53 minutes  Author: Concepcion Riser, MD 03/05/2024 2:45 PM Secure chat 7am to 7pm For on call review [email or web address].

## 2024-03-05 NOTE — Progress Notes (Signed)
 Physical Therapy Treatment Patient Details Name: Kathryn Scott MRN: [id number] DOB: 01/06/1946 Today's Date: 03/05/2024   History of Present Illness Pt is a 78 y.o. female admitted 7/23 for SOB and hypoxia to 70s with mobility. CT negative for PE, worsening bil opacities. Pt briefly requiring BiPAP 7/26, transitioned to [facility]. PMH includes: Recent admission 7/17-7/21 with SOB, PNA, and PE, d/c home with 3L O2, PE, asthma, pulmonary fibrosis, HTN, CAD, hypothyroidism, DM II.    PT Comments  Pt slow to progress toward goals due to a declined respiratory status over much of her admission.  Emphasis on time up on her feet, transfers and respiratory tolerance for mobility.  Pt was limited in ability to participate today and dropped to 80% with little activity while on HHFNC at 40 L and FiO2 of 80%.    If plan is discharge home, recommend the following: A little help with walking and/or transfers;A little help with bathing/dressing/bathroom;Assistance with cooking/housework;Help with stairs or ramp for entrance   Can travel by private vehicle     Yes  Equipment Recommendations  Rolling walker (2 wheels);Wheelchair (measurements PT);Wheelchair cushion (measurements PT);BSC/3in1    Recommendations for Other Services       Precautions / Restrictions Precautions Precautions: Fall Recall of Precautions/Restrictions: Intact Precaution/Restrictions Comments: HHFNC  FiO2 80% at 35-40L     Mobility  Bed Mobility Overal bed mobility: Needs Assistance Bed Mobility: Sit to Supine       Sit to supine: Contact guard assist   General bed mobility comments: pt got fully into bed without assist, but could not reposition immediately due to precipitous fall in sats to 80% with cough.    Transfers Overall transfer level: Needs assistance   Transfers: Sit to/from Stand, Bed to chair/wheelchair/BSC Sit to Stand: Min assist   Step pivot transfers: Min assist (light min without AD)             Ambulation/Gait               General Gait Details: deferred due to sats started dropping and pt showed some distress and mild difficulty catching her breath.  Sats dropped to 80% on 40L @ 80% FiO2 and took approx 5 min to return to 90%   Stairs             Wheelchair Mobility     Tilt Bed    Modified Rankin (Stroke Patients Only)       Balance Overall balance assessment: Needs assistance   Sitting balance-Leahy Scale: Good                                      Communication    Cognition Arousal: Alert Behavior During Therapy: WFL for tasks assessed/performed   PT - Cognitive impairments: No apparent impairments                         Following commands: Intact      Cueing Cueing Techniques: Verbal cues  Exercises      General Comments        Pertinent Vitals/Pain Pain Assessment Pain Assessment: No/denies pain    Home Living                          Prior Function            PT Goals (current  goals can now be found in the care plan section) Acute Rehab PT Goals PT Goal Formulation: With patient Time For Goal Achievement: 03/11/24 Potential to Achieve Goals: Good Progress towards PT goals: Progressing toward goals    Frequency    Min 2X/week      PT Plan      Co-evaluation              AM-PAC PT 6 Clicks Mobility   Outcome Measure  Help needed turning from your back to your side while in a flat bed without using bedrails?: A Little Help needed moving from lying on your back to sitting on the side of a flat bed without using bedrails?: A Little Help needed moving to and from a bed to a chair (including a wheelchair)?: A Little Help needed standing up from a chair using your arms (e.g., wheelchair or bedside chair)?: A Little Help needed to walk in hospital room?: A Lot Help needed climbing 3-5 steps with a railing? : Total 6 Click Score: 15    End of Session          PT Visit Diagnosis: Muscle weakness (generalized) (M62.81);Difficulty in walking, not elsewhere classified (R26.2)     Time: 1201-1222 PT Time Calculation (min) (ACUTE ONLY): 21 min  Charges:    $Therapeutic Activity: 8-22 mins PT General Charges $$ ACUTE PT VISIT: 1 Visit                     03/05/2024  [place] HERO., PT Acute Rehabilitation Services [phone]  (office)   Vinie GAILS Lolly Glaus 03/05/2024, 1:04 PM

## 2024-03-05 NOTE — Plan of Care (Signed)
  Problem: Health Behavior/Discharge Planning: Goal: Ability to manage health-related needs will improve Outcome: Progressing   Problem: Clinical Measurements: Goal: Cardiovascular complication will be avoided Outcome: Progressing   Problem: Coping: Goal: Level of anxiety will decrease Outcome: Progressing   Problem: Elimination: Goal: Will not experience complications related to urinary retention Outcome: Progressing   Problem: Safety: Goal: Ability to remain free from injury will improve Outcome: Progressing   Problem: Respiratory: Goal: Ability to maintain adequate ventilation will improve Outcome: Progressing

## 2024-03-05 NOTE — Progress Notes (Signed)
 This chaplain is present for F/U spiritual care in the setting of Pt. and family support and creating the Pt. Advance Directive.  The Pt. is awake  and joined at the bedside by her daughter-Beverly and husband-David.  Alm shares with the chaplain the Pt. and family are ready to complete the Pt. AD and MOST form. Alm becomes quietly tearful and steps out of the room. The Pt. asks to wait until Monday so the family can process the completion of the documents and the recording of the Pt. voice in small chunks. The chaplain understands the Pt. will name her daughter-Beverly Authier as her healthcare agent. The incomplete AD was left at the bedside for review.   The Pt. and daughter shared quiet moments of trust and affirmation for each other as the chaplain prepared to leave.  This chaplain is available for F/U spiritual care as needed.  Chaplain Leeroy Hummer [phone]

## 2024-03-05 NOTE — Progress Notes (Signed)
 NAME:  Kathryn Scott, MRN:  [id number], DOB:  1946-07-11, LOS: 9 ADMISSION DATE:  02/25/2024, CONSULTATION DATE: 02/25/2024 REFERRING MD: CHARLENA Blanch, MD, CHIEF COMPLAINT: Acute hypoxic respiratory failure  History of Present Illness:   The patient, with idiopathic pulmonary fibrosis, presents with worsening respiratory symptoms following a recent hospitalization for pneumonia and pulmonary embolism. She is accompanied by her daughter, Rojelio.  Dyspnea and hypoxemia - Worsening shortness of breath following recent hospitalization for pneumonia and pulmonary embolism - Oxygen saturation drops to 70-71% during activities such as talking or going to the restroom, even when oxygen concentrator increased to five liters - Currently requiring 18 liters of oxygen - Discharged from hospital on Monday with instructions for oxygen therapy at three liters at rest and four liters during activity  Cough and hemoptysis - Cough with bloody mucus developed this morning  Fever - Fever developed this morning to 101  Recent hospitalization and cardiac findings - Admitted last Wednesday morning and discharged on Monday - Diagnosed with pneumonia, small pulmonary embolism, and suspected atrial fibrillation during hospitalization - Echocardiogram performed during last hospital visit showed moderate pulmonary HTN - Previous cardiac ultrasound in January of 2025 did not show any evidence of pulmonary hypertension  Idiopathic pulmonary fibrosis management - Receiving treatment with Ofev , taken consistently at home  Anticoagulation therapy - On Eliquis  for small pulmonary embolism, started at last admission  Peripheral edema - No swelling in feet  Pertinent  Medical History    has a past medical history of Asthma, Diabetes mellitus without complication (HCC), and Hypertension.   Significant Hospital Events: Including procedures, antibiotic start and stop dates in addition to other pertinent  events   7/23 pccm consult for Aoc hypoxia ?ILD flare  7/26 Worsening respiratory failure overnight requiring heated high flow nasal cannula and transition to BiPAP for desats. Given Ativan  for anxiety. Chest x-ray with worsening bilateral opacities 7/28 oxygen requirement improving 7/30 remains on high flow oxygen  Interim History / Subjective:   Looks a little bit better today, was able to sit on the chair for a while chronically ill-appearing  Objective    Blood pressure (!) 120/55, pulse 72, temperature 97.9 F (36.6 C), temperature source Oral, resp. rate (!) 21, height 5' 6 (1.676 m), weight 67 kg, SpO2 92%.    FiO2 (%):  [70 %-90 %] 83 %   Intake/Output Summary (Last 24 hours) at 03/05/2024 1502 Last data filed at 03/04/2024 1547 Gross per 24 hour  Intake --  Output 450 ml  Net -450 ml   Filed Weights   03/03/24 0439 03/04/24 0407 03/05/24 0422  Weight: 68.8 kg 67.5 kg 67 kg   General: Chronically ill-appearing, HEENT: Moist oral mucosa Neuro: Alert and oriented x 3 CV: S1-S2 appreciated PULM: Bilateral rales GI: Soft, nontender Extremities: Skin is warm and dry   CTA 02/19/2024-segmental pulmonary embolism in the right upper lobe, new airspace consolidation, groundglass opacities in the right upper lobe, small right pleural effusion  CTA 02/24/2021-negative for pulmonary embolism.  Worsening diffuse bilateral heterogeneous groundglass opacities, mild adenopathy.  Echocardiogram 02/20/2024-LVEF 65-70%.  RV systolic function is normal, moderate elevation in PA systolic pressure  Resolved problem list   Assessment and Plan   Acute on chronic hypoxic respiratory failure Community-acquired pneumonia Subsegmental PE in the right upper lobe Pulmonary hypertension Small pleural effusion Deconditioning  Continue prednisone  at 40 Did receive 3 days of high-dose steroids IV On Eliquis  for anticoagulation Completed course of antibiotics  Will follow

## 2024-03-05 NOTE — Plan of Care (Signed)
   Problem: Education: Goal: Ability to describe self-care measures that may prevent or decrease complications (Diabetes Survival Skills Education) will improve Outcome: Progressing Goal: Individualized Educational Video(s) Outcome: Progressing

## 2024-03-05 DEATH — deceased

## 2024-03-06 DIAGNOSIS — J84112 Idiopathic pulmonary fibrosis: Secondary | ICD-10-CM | POA: Diagnosis not present

## 2024-03-06 DIAGNOSIS — J9621 Acute and chronic respiratory failure with hypoxia: Secondary | ICD-10-CM | POA: Diagnosis not present

## 2024-03-06 DIAGNOSIS — J9620 Acute and chronic respiratory failure, unspecified whether with hypoxia or hypercapnia: Secondary | ICD-10-CM | POA: Diagnosis not present

## 2024-03-06 DIAGNOSIS — I2699 Other pulmonary embolism without acute cor pulmonale: Secondary | ICD-10-CM | POA: Diagnosis not present

## 2024-03-06 DIAGNOSIS — J90 Pleural effusion, not elsewhere classified: Secondary | ICD-10-CM | POA: Diagnosis not present

## 2024-03-06 DIAGNOSIS — J189 Pneumonia, unspecified organism: Secondary | ICD-10-CM | POA: Diagnosis not present

## 2024-03-06 DIAGNOSIS — I2694 Multiple subsegmental pulmonary emboli without acute cor pulmonale: Secondary | ICD-10-CM | POA: Diagnosis not present

## 2024-03-06 DIAGNOSIS — E43 Unspecified severe protein-calorie malnutrition: Secondary | ICD-10-CM | POA: Diagnosis not present

## 2024-03-06 LAB — GLUCOSE, CAPILLARY
Glucose-Capillary: 107 mg/dL — ABNORMAL HIGH (ref 70–99)
Glucose-Capillary: 121 mg/dL — ABNORMAL HIGH (ref 70–99)
Glucose-Capillary: 136 mg/dL — ABNORMAL HIGH (ref 70–99)
Glucose-Capillary: 143 mg/dL — ABNORMAL HIGH (ref 70–99)
Glucose-Capillary: 188 mg/dL — ABNORMAL HIGH (ref 70–99)
Glucose-Capillary: 209 mg/dL — ABNORMAL HIGH (ref 70–99)
Glucose-Capillary: 219 mg/dL — ABNORMAL HIGH (ref 70–99)

## 2024-03-06 NOTE — Progress Notes (Signed)
 Progress Note   Patient: Kathryn Scott FMW:[id number] DOB: [birth year] DOA: 02/25/2024     10 DOS: the patient was seen and examined on 03/06/2024   Brief hospital course: 78 y.o. female with past medical history of pulmonary embolism, idiopathic pulmonary fibrosis, community-acquired pneumonia, acute kidney injury, essential hypertension, diabetes mellitus type 2, acquired hypothyroidism presented for evaluation of respiratory distress. She was recently admitted on the 17th and discharged on the 21st found to have a pulmonary embolism. Patient was admitted for respiratory failure, interstitial pulmonary fibrosis, presumed pneumonia  Palliative and pulmonology are on board. She is DNR/ limited. She is not doing well, still on high flow o2, energy poor, not eating   Assessment and Plan: Acute on chronic hypoxic respiratory failure Continues on Optiflow/high flow at 40/90%.  Saturations around 93%.  Continue to attempt to wean O2 as tolerated.  She was discharged on 3-4 L 2 weeks ago. Follow pulmonary recommendations..   Acute exacerbation of idiopathic pulmonary fibrosis 2/2 recurrent pneumonia Wean high flow O2. Completed 7 days IV cefepime  7/29.  IV Solu-Medrol  transitioned to p.o. prednisone .  Bronchodilators on board. Continue pulmonary toilet. Pulmonology following closely.  Leukocytosis: Likely in the setting of steroids. Trend daily CBC.   Acute pulmonary embolism Diagnosed at last visit.  Continue Eliquis  therapy.   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   Wound productive cough. Continue to monitor daily, renal function.   Diabetes mellitus Insulin  glargine on board.  Sugars exacerbated by corticosteroid use.  Continue Accu-Cheks, insulin  sliding scale.   Hypomagnesemia Mag 1.9.  Resolved after correction.   Hypothyroid Continue home dose Synthroid    Anxiety Sertraline  on board.   Goals of care Palliative care consulted, she has  poor prognosis.  Currently DNR/DNI.   Chaplain services for spiritual care, advanced directives.  She was asked to fill MOST form on Monday.     Out of bed to chair. Incentive spirometry. Nursing supportive care. Fall, aspiration precautions. Diet:  Diet Orders (From admission, onward)     Start     Ordered   02/25/24 1855  Diet Carb Modified Fluid consistency: Thin; Room service appropriate? Yes  Diet effective now       Question Answer Comment  Diet-HS Snack? Nothing   Calorie Level Medium 1600-2000   Fluid consistency: Thin   Room service appropriate? Yes      02/25/24 1854           DVT prophylaxis:  apixaban  (ELIQUIS ) tablet 5 mg  Level of care: Progressive   Code Status: Limited: Do not attempt resuscitation (DNR) -DNR-LIMITED -Do Not Intubate/DNI   Subjective: Patient is seen and examined today morning.  She is on high flow oxygen. Feels short of breath, energy poor.  Eating poor.  No family at bedside.   Physical Exam: Vitals:   03/06/24 0641 03/06/24 0812 03/06/24 0814 03/06/24 1152  BP:  131/75 131/75 130/81  Pulse: 71  88   Resp:  16 (!) 22 20  Temp:  98.1 F (36.7 C)  98 F (36.7 C)  TempSrc:  Oral  Oral  SpO2: 91%  96%   Weight: 66.8 kg     Height:        General - Elderly Caucasian ill female, moderate respiratory distress HEENT - PERRLA, EOMI, atraumatic head, non tender sinuses. Lung - decreased, diffuse rales, rhonchi, wheezes. Heart - S1, S2 heard, no murmurs, rubs, trace pedal edema. Abdomen - Soft, non tender, bowel sounds  good Neuro - Alert, awake and oriented x 3, non focal exam. Skin - Warm and dry.  Data Reviewed:      Latest Ref Rng & Units 03/05/2024    4:56 AM 03/02/2024    4:38 AM 03/01/2024    5:01 AM  CBC  WBC 4.0 - 10.5 K/uL 17.5  17.3  16.1   Hemoglobin 12.0 - 15.0 g/dL 89.9  9.0  8.4   Hematocrit 36.0 - 46.0 % 31.4  27.3  25.5   Platelets 150 - 400 K/uL 367  493  429       Latest Ref Rng & Units 03/05/2024    4:56 AM  03/03/2024    4:36 AM 03/02/2024    4:38 AM  BMP  Glucose 70 - 99 mg/dL 861  99  82   BUN 8 - 23 mg/dL 51  55  56   Creatinine 0.44 - 1.00 mg/dL 8.91  8.77  8.60   Sodium 135 - 145 mmol/L 143  139  143   Potassium 3.5 - 5.1 mmol/L 3.3  4.0  3.0   Chloride 98 - 111 mmol/L 104  108  107   CO2 22 - 32 mmol/L 27  23  25    Calcium  8.9 - 10.3 mg/dL 9.5  9.1  9.1    No results found.  Family Communication: Discussed with patient and her husband at bedside, also daughter over phone. They understand and agree. All questions answered.  Disposition: Status is: Inpatient Remains inpatient appropriate because: severity of illness.  Planned Discharge Destination: Home with Home Health     Time spent: 51 minutes  Author: Concepcion Riser, MD 03/06/2024 2:19 PM Secure chat 7am to 7pm For on call review [email or web address].

## 2024-03-06 NOTE — Plan of Care (Signed)
  Problem: Health Behavior/Discharge Planning: Goal: Ability to manage health-related needs will improve Outcome: Progressing   Problem: Clinical Measurements: Goal: Cardiovascular complication will be avoided Outcome: Progressing   Problem: Activity: Goal: Risk for activity intolerance will decrease Outcome: Progressing   Problem: Safety: Goal: Ability to remain free from injury will improve Outcome: Progressing   Problem: Respiratory: Goal: Ability to maintain adequate ventilation will improve Outcome: Progressing

## 2024-03-06 NOTE — Progress Notes (Signed)
   NAME:  Kathryn Scott, MRN:  [id number], DOB:  [birth year], LOS: 10 ADMISSION DATE:  02/25/2024, CONSULTATION DATE: 02/25/2024 REFERRING MD: CHARLENA Blanch, MD, CHIEF COMPLAINT: Acute hypoxic respiratory failure  History of Present Illness:   The patient, with idiopathic pulmonary fibrosis, presents with worsening respiratory symptoms following a recent hospitalization for pneumonia and pulmonary embolism. She is accompanied by her daughter, Rojelio.  Dyspnea and hypoxemia - Worsening shortness of breath following recent hospitalization for pneumonia and pulmonary embolism - Oxygen saturation drops to 70-71% during activities such as talking or going to the restroom, even when oxygen concentrator increased to five liters - Currently requiring 18 liters of oxygen - Discharged from hospital on Monday with instructions for oxygen therapy at three liters at rest and four liters during activity  Cough and hemoptysis - Cough with bloody mucus developed this morning  Fever - Fever developed this morning to 101  Recent hospitalization and cardiac findings - Admitted last Wednesday morning and discharged on Monday - Diagnosed with pneumonia, small pulmonary embolism, and suspected atrial fibrillation during hospitalization - Echocardiogram performed during last hospital visit showed moderate pulmonary HTN - Previous cardiac ultrasound in January of 2025 did not show any evidence of pulmonary hypertension  Idiopathic pulmonary fibrosis management - Receiving treatment with Ofev , taken consistently at home  Anticoagulation therapy - On Eliquis  for small pulmonary embolism, started at last admission  Peripheral edema - No swelling in feet  Pertinent  Medical History    has a past medical history of Asthma, Diabetes mellitus without complication (HCC), and Hypertension.   Significant Hospital Events: Including procedures, antibiotic start and stop dates in addition to other pertinent  events   7/23 pccm consult for Aoc hypoxia ?ILD flare  7/26 Worsening respiratory failure overnight requiring heated high flow nasal cannula and transition to BiPAP for desats. Given Ativan  for anxiety. Chest x-ray with worsening bilateral opacities 7/28 oxygen requirement improving 7/30 remains on high flow oxygen  Interim History / Subjective:   Remains on high flow nasal cannula No overnight events Spouse at bedside Patient resting  Objective    Blood pressure 130/81, pulse 88, temperature 98 F (36.7 C), temperature source Oral, resp. rate 20, height 5' 6 (1.676 m), weight 66.8 kg, SpO2 96%.    FiO2 (%):  [83 %-100 %] 100 %   Intake/Output Summary (Last 24 hours) at 03/06/2024 1359 Last data filed at 03/06/2024 1154 Gross per 24 hour  Intake 474 ml  Output 500 ml  Net -26 ml   Filed Weights   03/04/24 0407 03/05/24 0422 03/06/24 0641  Weight: 67.5 kg 67 kg 66.8 kg   Exam - Limited - No significant change   CTA 02/19/2024-segmental pulmonary embolism in the right upper lobe, new airspace consolidation, groundglass opacities in the right upper lobe, small right pleural effusion  CTA 02/24/2021-negative for pulmonary embolism.  Worsening diffuse bilateral heterogeneous groundglass opacities, mild adenopathy.  Echocardiogram 02/20/2024-LVEF 65-70%.  RV systolic function is normal, moderate elevation in PA systolic pressure  Resolved problem list   Assessment and Plan   Acute on chronic hypoxic respiratory failure Community-acquired pneumonia Subsegmental PE in right lower lobe Pulmonary hypertension Small pleural effusion Deconditioning  On prednisone  at 40 Received 3 days of high-dose steroids Continue Eliquis  Completed course of antibiotics  Encouraged to optimize nutrition Encouraged to continue with physical therapy

## 2024-03-06 NOTE — Plan of Care (Signed)
   Problem: Education: Goal: Ability to describe self-care measures that may prevent or decrease complications (Diabetes Survival Skills Education) will improve Outcome: Progressing

## 2024-03-07 DIAGNOSIS — I2699 Other pulmonary embolism without acute cor pulmonale: Secondary | ICD-10-CM | POA: Diagnosis not present

## 2024-03-07 DIAGNOSIS — Z515 Encounter for palliative care: Secondary | ICD-10-CM | POA: Diagnosis not present

## 2024-03-07 DIAGNOSIS — E43 Unspecified severe protein-calorie malnutrition: Secondary | ICD-10-CM | POA: Diagnosis not present

## 2024-03-07 DIAGNOSIS — Z7189 Other specified counseling: Secondary | ICD-10-CM

## 2024-03-07 DIAGNOSIS — J9621 Acute and chronic respiratory failure with hypoxia: Secondary | ICD-10-CM

## 2024-03-07 DIAGNOSIS — R0603 Acute respiratory distress: Secondary | ICD-10-CM | POA: Diagnosis not present

## 2024-03-07 DIAGNOSIS — J84112 Idiopathic pulmonary fibrosis: Secondary | ICD-10-CM | POA: Diagnosis not present

## 2024-03-07 LAB — CBC
HCT: 31.9 % — ABNORMAL LOW (ref 36.0–46.0)
Hemoglobin: 10 g/dL — ABNORMAL LOW (ref 12.0–15.0)
MCH: 29.2 pg (ref 26.0–34.0)
MCHC: 31.3 g/dL (ref 30.0–36.0)
MCV: 93 fL (ref 80.0–100.0)
Platelets: 282 K/uL (ref 150–400)
RBC: 3.43 MIL/uL — ABNORMAL LOW (ref 3.87–5.11)
RDW: 13.9 % (ref 11.5–15.5)
WBC: 20.4 K/uL — ABNORMAL HIGH (ref 4.0–10.5)
nRBC: 0 % (ref 0.0–0.2)

## 2024-03-07 LAB — BASIC METABOLIC PANEL WITH GFR
Anion gap: 11 (ref 5–15)
BUN: 51 mg/dL — ABNORMAL HIGH (ref 8–23)
CO2: 28 mmol/L (ref 22–32)
Calcium: 8.9 mg/dL (ref 8.9–10.3)
Chloride: 104 mmol/L (ref 98–111)
Creatinine, Ser: 1.02 mg/dL — ABNORMAL HIGH (ref 0.44–1.00)
GFR, Estimated: 57 mL/min — ABNORMAL LOW (ref >60)
Glucose, Bld: 92 mg/dL (ref 70–99)
Potassium: 3 mmol/L — ABNORMAL LOW (ref 3.5–5.1)
Sodium: 143 mmol/L (ref 135–145)

## 2024-03-07 LAB — GLUCOSE, CAPILLARY
Glucose-Capillary: 94 mg/dL (ref 70–99)
Glucose-Capillary: 160 mg/dL — ABNORMAL HIGH (ref 70–99)

## 2024-03-07 MED ORDER — LORAZEPAM 2 MG/ML IJ SOLN: 0.5000 mg | INTRAMUSCULAR | Status: DC | PRN

## 2024-03-07 MED ORDER — ONDANSETRON 4 MG PO TBDP: 4.0000 mg | ORAL_TABLET | Freq: Four times a day (QID) | ORAL | Status: DC | PRN

## 2024-03-07 MED ORDER — MORPHINE BOLUS VIA INFUSION: 4.0000 mg | INTRAVENOUS | Status: DC | PRN

## 2024-03-07 MED ORDER — POLYVINYL ALCOHOL 1.4 % OP SOLN: 1.0000 [drp] | Freq: Four times a day (QID) | OPHTHALMIC | Status: DC | PRN

## 2024-03-07 MED ORDER — BIOTENE DRY MOUTH MT LIQD
15.0000 mL | OROMUCOSAL | Status: DC | PRN
Start: 2024-03-07 — End: 2024-03-07

## 2024-03-07 MED ORDER — DIAZEPAM 5 MG/ML IJ SOLN
2.5000 mg | INTRAMUSCULAR | Status: DC | PRN
  Administered 2024-03-07: 2.5 mg via INTRAVENOUS
  Filled 2024-03-07: qty 2

## 2024-03-07 MED ORDER — MORPHINE 100MG IN NS 100ML (1MG/ML) PREMIX INFUSION
4.0000 mg/h | INTRAVENOUS | Status: DC
  Administered 2024-03-07: 4 mg/h via INTRAVENOUS
  Filled 2024-03-07: qty 100

## 2024-03-07 MED ORDER — POTASSIUM CHLORIDE 20 MEQ PO PACK: 40.0000 meq | PACK | Freq: Every day | ORAL | Status: DC

## 2024-03-07 MED ORDER — ONDANSETRON HCL 4 MG/2ML IJ SOLN: 4.0000 mg | Freq: Four times a day (QID) | INTRAMUSCULAR | Status: DC | PRN

## 2024-03-07 MED ORDER — GLYCOPYRROLATE 0.2 MG/ML IJ SOLN: 0.2000 mg | INTRAMUSCULAR | Status: DC | PRN

## 2024-03-07 MED ORDER — GLYCOPYRROLATE 1 MG PO TABS: 1.0000 mg | ORAL_TABLET | ORAL | Status: DC | PRN

## 2024-03-07 MED ORDER — DIAZEPAM 5 MG/ML IJ SOLN
2.5000 mg | Freq: Three times a day (TID) | INTRAMUSCULAR | Status: DC
  Administered 2024-03-07: 2.5 mg via INTRAVENOUS
  Filled 2024-03-07: qty 2

## 2024-03-24 ENCOUNTER — Ambulatory Visit: Admitting: General Practice

## 2024-04-05 NOTE — Progress Notes (Signed)
 Patient got up out of bed for daily weight. Immediately got SOB and started to complain of not being able to breathe. She became very anxious and had steady shallow breathes and O2 sats were remaining in the 80's. Respiratory was called and she was placed on BiPaP for the moment. Sats are now high 90's.

## 2024-04-05 NOTE — Plan of Care (Signed)
   Problem: Coping: Goal: Ability to adjust to condition or change in health will improve Outcome: Progressing   Problem: Fluid Volume: Goal: Ability to maintain a balanced intake and output will improve Outcome: Progressing

## 2024-04-05 NOTE — Progress Notes (Signed)
 75 ml of morphine  wasted in steri cycle container. Witnessed by Harlene Kraft RN  Jomarion Mish, Cena Helling, RN

## 2024-04-05 NOTE — Progress Notes (Signed)
 Chaplain engaged in an initial visit with Kathryn Scott and her family. Chaplain established relationship and built rapport. Chaplain provided space for family to engage in storytelling and narrative life review. 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 They described her as patient, quiet, and feisty. Kathryn Scott shared stories of when he first met Kathryn Scott and how fate brought them together several times, as well as funny moment they have shared throughout their time together. They have been married 56 years. Chaplain offered reflective listening and compassion as they shared memories.  Chaplain prayed over [place] with their permission as she was surrounded by family. Family was appreciative of Chaplain support.  Elia Vera Lemming, [facility]  03/09/24 1600  Spiritual Encounters  Type of Visit Initial  Care provided to: Pt and family  Referral source Nurse (RN/NT/LPN);Other (comment) (Palliative Care)  Reason for visit End-of-life  OnCall Visit Yes  Spiritual Framework  Presenting Themes Impactful experiences and emotions;Community and relationships;Values and beliefs  Values/beliefs Prayer oriented; accepted Chrisitian prayer  Community/Connection Family  Strengths Family  Family Stress Factors Major life changes;Other (Comment) (Grief)  Interventions  Spiritual Care Interventions Made Established relationship of care and support;Compassionate presence;Reflective listening;Normalization of emotions;Encouragement;Supported grief process;Narrative/life review;Explored values/beliefs/practices/strengths;Prayer  Intervention Outcomes  Outcomes Awareness of support;Connection to spiritual care

## 2024-04-05 NOTE — Progress Notes (Signed)
 Patient transferred to morgue, post mortem  flowsheet completed with correct information.  Anneli Bing, Cena Helling, RN

## 2024-04-05 NOTE — Death Summary Note (Signed)
 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 ,  Nutrition Documentation    Flowsheet Row ED to Hosp-Admission (Current) from March 23, 2024 in [facility]  Nutrition Problem Severe Malnutrition  Etiology poor appetite, chronic illness  Nutrition Goal Patient will meet greater than or equal to 90% of their needs  Interventions Refer to RD note for recommendations  ,  (Optional):26781}   Procedures: none  Consultations: PCCM  The results of significant diagnostics from this hospitalization (including imaging, microbiology, ancillary and laboratory) are listed below for reference.   Significant Diagnostic Studies: DG CHEST PORT 1 VIEW Result Date: 02/28/2024 CLINICAL DATA:  [id number]. Acute on chronic respiratory failure with hypoxia. EXAM: PORTABLE CHEST 1 VIEW COMPARISON:  Portable chest 2024/03/23, CTA chest 23-Mar-2024. FINDINGS: 3:34 a.m. There is background widespread interstitial coarsening consistent with subpleural fibrosis. Superimposed on this is worsening patchy dense bilateral airspace disease consistent with edema, pneumonia or combination. There are small pleural effusions which appear similar. The mediastinum is stable with aortic atherosclerosis. There is mild cardiomegaly.  Central vessels are not well seen due to airspace disease. No new osseous finding. IMPRESSION: 1. Worsening patchy dense bilateral airspace disease consistent with edema, pneumonia or combination. Background chronic interstitial lung disease. 2. Small pleural effusions. 3. Aortic atherosclerosis. 4. Mild cardiomegaly. Electronically Signed   By: Francis Quam M.D.   On: 02/28/2024 03:52   CT Angio Chest PE W and/or Wo Contrast Result Date: 03/23/2024 CLINICAL DATA:  Follow-up PE, increased oxygen demand EXAM: CT ANGIOGRAPHY CHEST WITH CONTRAST TECHNIQUE: Multidetector CT imaging of the chest was performed using the standard protocol during bolus administration of intravenous contrast. Multiplanar CT image reconstructions and MIPs were obtained to evaluate the vascular anatomy. RADIATION DOSE REDUCTION: This exam was performed according to the departmental dose-optimization program which includes automated exposure control, adjustment of the mA and/or kV according to patient size and/or use of iterative reconstruction technique. CONTRAST:  60mL OMNIPAQUE  IOHEXOL  350 MG/ML SOLN COMPARISON:  Chest x-ray 03/23/24, chest CT 02/19/2024, CT chest 07/25/2023 FINDINGS: Cardiovascular: Satisfactory opacification of the pulmonary arteries to the segmental level. No evidence of pulmonary embolism. Previously noted small right upper lobe embolus is no longer visualized. Moderate aortic atherosclerosis. No aneurysm or dissection. Borderline to mild cardiomegaly. No pericardial effusion Mediastinum/Nodes: Patent trachea. No thyroid mass. Prominent right paratracheal node measuring up to 14 mm. AP window nodes measuring up to 9 mm. Right hilar nodes measuring up to 17 mm. Esophagus within normal limits. Lungs/Pleura: Underlying chronic lung disease and fibrosis. Increased small left and small moderate right pleural effusions compared to prior. Worsened diffuse bilateral heterogeneous ground-glass airspace disease throughout  the lungs. No pneumothorax. Upper Abdomen: No acute finding Musculoskeletal: No acute osseous abnormality. Review of the MIP images confirms the above findings. IMPRESSION: 1. Negative for acute pulmonary embolus. Previously  noted small right upper lobe embolus is no longer visualized. 2. Underlying chronic lung disease and fibrosis. Worsened diffuse bilateral heterogeneous ground-glass airspace disease throughout the lungs, suspect for worsened edema or infection. Increased small left and small to moderate right pleural effusions. 3. Mild mediastinal and right hilar adenopathy, likely reactive, slightly increased. 4. Aortic atherosclerosis. Aortic Atherosclerosis (ICD10-I70.0). Electronically Signed   By: Luke Bun M.D.   On: 02/25/2024 16:03   DG Chest Port 1 View Result Date: 02/25/2024 CLINICAL DATA:  Questionable sepsis-evaluate for abnormality. EXAM: PORTABLE CHEST 1 VIEW COMPARISON:  Radiographs 02/19/2024 and 08/23/2021. CT 02/19/2024 and 07/25/2023. FINDINGS: 1418 hours. The heart size and mediastinal contours are stable with aortic atherosclerosis. Background diffuse interstitial thickening characterized as probable UIP on previous high-resolution chest CT. Compared with the recent prior studies, there is increased diffuse interstitial thickening, suspicious for edema or atypical infection. The focal airspace disease inferiorly in the right upper lobe has partially cleared. No evidence of pneumothorax or significant pleural effusion. No acute osseous findings. Telemetry leads overlie the chest. IMPRESSION: 1. Increased diffuse interstitial thickening suspicious for edema or atypical infection superimposed on chronic interstitial lung disease. Continued follow-up recommended. 2. Partial clearing of focal airspace disease inferiorly in the right upper lobe. Electronically Signed   By: Kathryn Perone M.D.   On: 02/25/2024 14:33   VAS US  LOWER EXTREMITY VENOUS (DVT) Result Date: 02/21/2024  Lower  Venous DVT Study Patient Name:  Kathryn Scott  Date of Exam:   02/20/2024 Medical Rec #: [id number]       Accession #:    [id number] Date of Birth: January 16, 1946      Patient Gender: F Patient Age:   41 years Exam Location:  [facility] Procedure:      VAS US  LOWER EXTREMITY VENOUS (DVT) Referring Phys: MICAELA SUNDIL --------------------------------------------------------------------------------  Indications: Swelling, and Edema.  Performing Technologist: Elmarie Lindau, RVT  Examination Guidelines: A complete evaluation includes B-mode imaging, spectral Doppler, color Doppler, and power Doppler as needed of all accessible portions of each vessel. Bilateral testing is considered an integral part of a complete examination. Limited examinations for reoccurring indications may be performed as noted. The reflux portion of the exam is performed with the patient in reverse Trendelenburg.  +---------+---------------+---------+-----------+----------+--------------+ RIGHT    CompressibilityPhasicitySpontaneityPropertiesThrombus Aging +---------+---------------+---------+-----------+----------+--------------+ CFV      Full           Yes      Yes                                 +---------+---------------+---------+-----------+----------+--------------+ SFJ      Full                                                        +---------+---------------+---------+-----------+----------+--------------+ FV Prox  Full                                                        +---------+---------------+---------+-----------+----------+--------------+ FV Mid   Full                                                        +---------+---------------+---------+-----------+----------+--------------+  FV DistalFull                                                        +---------+---------------+---------+-----------+----------+--------------+ PFV      Full                                                         +---------+---------------+---------+-----------+----------+--------------+ POP      Full           Yes      Yes                                 +---------+---------------+---------+-----------+----------+--------------+ PTV      Full                                                        +---------+---------------+---------+-----------+----------+--------------+ PERO     Full                                                        +---------+---------------+---------+-----------+----------+--------------+   +---------+---------------+---------+-----------+----------+--------------+ LEFT     CompressibilityPhasicitySpontaneityPropertiesThrombus Aging +---------+---------------+---------+-----------+----------+--------------+ CFV      Full           Yes      Yes                                 +---------+---------------+---------+-----------+----------+--------------+ SFJ      Full                                                        +---------+---------------+---------+-----------+----------+--------------+ FV Prox  Full                                                        +---------+---------------+---------+-----------+----------+--------------+ FV Mid   Full                                                        +---------+---------------+---------+-----------+----------+--------------+ FV DistalFull                                                        +---------+---------------+---------+-----------+----------+--------------+  PFV      Full                                                        +---------+---------------+---------+-----------+----------+--------------+ POP      Full           Yes      Yes                                 +---------+---------------+---------+-----------+----------+--------------+ PTV      Full                                                         +---------+---------------+---------+-----------+----------+--------------+ PERO     Full                                                        +---------+---------------+---------+-----------+----------+--------------+     Summary: RIGHT: - There is no evidence of deep vein thrombosis in the lower extremity.  - No cystic structure found in the popliteal fossa.  LEFT: - There is no evidence of deep vein thrombosis in the lower extremity.  - No cystic structure found in the popliteal fossa.  *See table(s) above for measurements and observations. Electronically signed by Fonda Rim on 02/21/2024 at 9:55:35 AM.    Final    ECHOCARDIOGRAM COMPLETE Result Date: 02/20/2024    ECHOCARDIOGRAM REPORT   Patient Name:   Kathryn Scott Date of Exam: 02/20/2024 Medical Rec #:  [id number]      Height:       66.0 in Accession #:    [id number]     Weight:       156.1 lb Date of Birth:  [birth year]     BSA:          1.800 m Patient Age:    77 years       BP:           127/61 mmHg Patient Gender: F              HR:           62 bpm. Exam Location:  Inpatient Procedure: 2D Echo (Both Spectral and Color Flow Doppler were utilized during            procedure). Indications:    pulmonary embolus  History:        Patient has prior history of Echocardiogram examinations, most                 recent 08/25/2023. CAD; Risk Factors:Hypertension, Dyslipidemia                 and Diabetes.  Sonographer:    Tinnie Barefoot RDCS Referring Phys: [id number] SUBRINA SUNDIL IMPRESSIONS  1. Prominent moderator band in RV (images 35, 51). Left ventricular ejection fraction, by estimation, is 65 to 70%. The left ventricle has normal function. The left ventricle has no regional wall motion  abnormalities.  2. Right ventricular systolic function is normal. The right ventricular size is normal. There is moderately elevated pulmonary artery systolic pressure.  3. The mitral valve is normal in structure. Trivial mitral valve regurgitation.  4. The  aortic valve is tricuspid. Aortic valve regurgitation is trivial.  5. The inferior vena cava is normal in size with greater than 50% respiratory variability, suggesting right atrial pressure of 3 mmHg. Comparison(s): The left ventricular function is unchanged. FINDINGS  Left Ventricle: Prominent moderator band in RV (images 35, 51). Left ventricular ejection fraction, by estimation, is 65 to 70%. The left ventricle has normal function. The left ventricle has no regional wall motion abnormalities. The left ventricular internal cavity size was normal in size. There is no left ventricular hypertrophy. Right Ventricle: The right ventricular size is normal. Right vetricular wall thickness was not assessed. Right ventricular systolic function is normal. There is moderately elevated pulmonary artery systolic pressure. The tricuspid regurgitant velocity is  3.51 m/s, and with an assumed right atrial pressure of 3 mmHg, the estimated right ventricular systolic pressure is 52.3 mmHg. Left Atrium: Left atrial size was normal in size. Right Atrium: Right atrial size was normal in size. Pericardium: There is no evidence of pericardial effusion. Mitral Valve: The mitral valve is normal in structure. Trivial mitral valve regurgitation. Tricuspid Valve: The tricuspid valve is normal in structure. Tricuspid valve regurgitation is trivial. Aortic Valve: The aortic valve is tricuspid. Aortic valve regurgitation is trivial. Pulmonic Valve: The pulmonic valve was normal in structure. Pulmonic valve regurgitation is mild. Aorta: The aortic root and ascending aorta are structurally normal, with no evidence of dilitation. Venous: The inferior vena cava is normal in size with greater than 50% respiratory variability, suggesting right atrial pressure of 3 mmHg. IAS/Shunts: No atrial level shunt detected by color flow Doppler. Additional Comments: A device lead is visualized.  LEFT VENTRICLE PLAX 2D LVIDd:         4.70 cm   Diastology LVIDs:          2.80 cm   LV e' medial:    11.80 cm/s LV PW:         1.00 cm   LV E/e' medial:  8.1 LV IVS:        0.90 cm   LV e' lateral:   10.70 cm/s LVOT diam:     1.80 cm   LV E/e' lateral: 9.0 LV SV:         54 LV SV Index:   30 LVOT Area:     2.54 cm  RIGHT VENTRICLE             IVC RV Basal diam:  2.80 cm     IVC diam: 2.00 cm RV S prime:     16.60 cm/s TAPSE (M-mode): 2.3 cm LEFT ATRIUM             Index        RIGHT ATRIUM           Index LA diam:        3.80 cm 2.11 cm/m   RA Area:     14.80 cm LA Vol (A2C):   41.6 ml 23.11 ml/m  RA Volume:   38.80 ml  21.56 ml/m LA Vol (A4C):   45.9 ml 25.50 ml/m LA Biplane Vol: 47.0 ml 26.11 ml/m  AORTIC VALVE             PULMONIC VALVE LVOT Vmax:   91.50 cm/s  PR End Diast Vel: 6.97 msec LVOT Vmean:  60.500 cm/s LVOT VTI:    0.212 m  AORTA Ao Root diam: 2.80 cm Ao Asc diam:  2.80 cm MITRAL VALVE               TRICUSPID VALVE MV Area (PHT): 3.85 cm    TR Peak grad:   49.3 mmHg MV Decel Time: 197 msec    TR Vmax:        351.00 cm/s MV E velocity: 96.00 cm/s MV A velocity: 89.20 cm/s  SHUNTS MV E/A ratio:  1.08        Systemic VTI:  0.21 m                            Systemic Diam: 1.80 cm Vina Gull MD Electronically signed by Vina Gull MD Signature Date/Time: 02/20/2024/8:14:36 PM    Final    CT Angio Chest PE W and/or Wo Contrast Result Date: 02/19/2024 CLINICAL DATA:  High probability for PE.  Shortness of breath. EXAM: CT ANGIOGRAPHY CHEST WITH CONTRAST TECHNIQUE: Multidetector CT imaging of the chest was performed using the standard protocol during bolus administration of intravenous contrast. Multiplanar CT image reconstructions and MIPs were obtained to evaluate the vascular anatomy. RADIATION DOSE REDUCTION: This exam was performed according to the departmental dose-optimization program which includes automated exposure control, adjustment of the mA and/or kV according to patient size and/or use of iterative reconstruction technique. CONTRAST:  60mL OMNIPAQUE   IOHEXOL  350 MG/ML SOLN COMPARISON:  CT of the chest 07/25/2023. FINDINGS: Cardiovascular: There is a segmental pulmonary embolism in the right upper lobe image 10/62. No other pulmonary emboli are seen. The heart is enlarged. Aorta is normal in size. There are atherosclerotic calcifications of the aorta. Mediastinum/Nodes: There are nonenlarged and mildly enlarged paratracheal lymph nodes measuring up to 11 mm. Visualized thyroid gland and esophagus are within normal limits. There is a small hiatal hernia. Lungs/Pleura: There is a small right pleural effusion. Peripheral reticular opacities and honeycombing again seen throughout both lungs, progressed compared to 2024. There is new airspace consolidation and ground-glass opacities in the posterior and inferior right upper lobe. No pneumothorax visualized. Upper Abdomen: No acute abnormality. Musculoskeletal: No chest wall abnormality. No acute or significant osseous findings. Review of the MIP images confirms the above findings. IMPRESSION: 1. Segmental pulmonary embolism in the right upper lobe. No evidence for right heart strain. 2. New airspace consolidation and ground-glass opacities in the right upper lobe worrisome for pneumonia. 3. Small right pleural effusion. 4. Progressive interstitial lung disease. 5. Cardiomegaly. 6. Aortic atherosclerosis. Aortic Atherosclerosis (ICD10-I70.0). Electronically Signed   By: Greig Pique M.D.   On: 02/19/2024 21:04   DG Chest 2 View Result Date: 02/19/2024 EXAM: 2 VIEW(S) XRAY OF THE CHEST 02/19/2024 06:58:00 PM COMPARISON: Radiographs August 23, 2021 and CT chest July 25, 2023. CLINICAL HISTORY: CP. Pt has a hx of IP and asthma. Pt says she was carrying her grandson and might have pulled something. Pt reports CP and lower back pain that is progressively getting worse. Visible SOB that is more than usual per pt. FINDINGS: LUNGS AND PLEURA: Airspace opacity in the right mid lung suspicious for pneumonia. Follow  up in 6-8 weeks after treatment is recommended to ensure resolution. Diffuse bronchial wall thickening and interstitial coarsening. No pleural effusion or pneumothorax. HEART AND MEDIASTINUM: Stable cardiomediastinal silhouette. Aortic atherosclerotic calcification. BONES AND SOFT TISSUES: No acute osseous abnormality. IMPRESSION:  1. Airspace opacity in the right mid lung suspicious for pneumonia. Follow-up in 8 weeks after treatment is recommended to ensure resolution. 2. Diffuse bronchial wall thickening and interstitial coarsening likely related to underlying interstitial lung disease . Electronically signed by: Norman Gatlin MD 02/19/2024 07:32 PM EDT RP Workstation: [id number]    Microbiology: Recent Results (from the past 240 hours)  MRSA Next Gen by PCR, Nasal     Status: None   Collection Time: 02/27/24  7:49 AM   Specimen: Nasal Mucosa; Nasal Swab  Result Value Ref Range Status   MRSA by PCR Next Gen NOT DETECTED NOT DETECTED Final    Comment: (NOTE) The GeneXpert MRSA Assay (FDA approved for NASAL specimens only), is one component of a comprehensive MRSA colonization surveillance program. It is not intended to diagnose MRSA infection nor to guide or monitor treatment for MRSA infections. Test performance is not FDA approved in patients less than 74 years old. Performed at [facility], [address], [place], [place] [address]     Time spent: 40 minutes  Signed: Concepcion Riser, MD 2024-03-29

## 2024-04-05 NOTE — Progress Notes (Signed)
 Honor bridge notified of patient death, Spoke with Dionne Garner referal # [phone]. Eye prep completed before transferring to morgue.  Amiylah Anastos, Cena Helling, RN

## 2024-04-05 NOTE — Progress Notes (Signed)
   Palliative Medicine Inpatient Follow Up Note HPI: 78 y.o. female with past medical history of pulmonary embolism, idiopathic pulmonary fibrosis, community-acquired pneumonia, acute kidney injury, essential hypertension, diabetes mellitus type 2, acquired hypothyroidism, admitted on 02/25/2024 with respiratory distress and shortness of breath.   Today's Discussion 03-11-2024  *Please note that this is a verbal dictation therefore any spelling or grammatical errors are due to the Dragon Medical One system interpretation.  The Palliative medicine team was requested this morning as Kathryn Scott has experienced a decline from a respiratory perspective requiring continuous bipap.   Chart reviewed inclusive of vital signs, progress notes, laboratory results, and diagnostic images.   I met with Kathryn Scott, her spouse, Kathryn Scott, their daughter, Kathryn Scott, and patients RN, Kathryn Scott at bedside this morning.   Created space and opportunity for patients family to explore thoughts feelings and fears regarding Mykenzie's current medical situation. They share that Kathryn Scott has informed them that she is tired. We discussed options moving forward from here inclusive of comfort focused care.   We talked about transition to comfort measures in house and what that would entail inclusive of medications to control pain, dyspnea, agitation, nausea, itching, and hiccups.  We discussed stopping all uneccessary measures such as cardiac monitoring, blood draws, needle sticks, and frequent vital signs.   While it was exceptionally hard for Kathryn Scott he shares that he wants to honor what Kathryn Scott wants. She again during our time at bedside expressed being tired and readiness to transition off of aggressive supportive measures.  We discussed allowing time for family to visit and in the meanwhile starting a low dose morphine  gtt in anticipation of removal of bipap.  Utilized reflective listening throughout our time together.   Questions and  concerns addressed/Palliative Support Provided.   Objective Assessment: Vital Signs Vitals:   11-Mar-2024 0612 11-Mar-2024 0749  BP:    Pulse: 87   Resp:  (!) 36  Temp:  97.7 F (36.5 C)  SpO2: (!) 82% 96%    Intake/Output Summary (Last 24 hours) at Mar 11, 2024 1104 Last data filed at 2024/03/11 0600 Gross per 24 hour  Intake 577 ml  Output 150 ml  Net 427 ml   Last Weight  Most recent update: 03-11-24  6:10 AM    Weight  64 kg (141 lb 1.6 oz)            Gen:  Elderly chronically ill appearing Caucasian F HEENT: Dry mucous membranes CV: Regular rate and rhythm  PULM: On Bipap ABD: soft/nontender  EXT: (+) Trace BLE edema  Neuro: Alert and oriented x3   SUMMARY OF RECOMMENDATIONS   DNAR/DNI  Comfort Care  Initiate morphine  gtt with boluses  Diazepam  2.5mg  Q8H ATC  Additional comfort medications per [facility]  Start O2 wean after family has time to visit  Unrestricted visitation  Anticipate in Hospital passing  Ongoing PMT support ______________________________________________________________________________________ Kathryn Scott [facility] Palliative Medicine Team Team Cell Phone: [phone] Please utilize secure chat with additional questions, if there is no response within 30 minutes please call the above phone number  Time Spent: 65 Billing based on MDM: High  Palliative Medicine Team providers are available by phone from 7am to 7pm daily and can be reached through the team cell phone.  Should this patient require assistance outside of these hours, please call the patient's attending physician.

## 2024-04-05 NOTE — Plan of Care (Signed)
   Problem: Education: Goal: Ability to describe self-care measures that may prevent or decrease complications (Diabetes Survival Skills Education) will improve Outcome: Progressing Goal: Individualized Educational Video(s) Outcome: Progressing

## 2024-04-05 DEATH — deceased
